# Patient Record
Sex: Female | Born: 1945 | Race: White | Hispanic: No | Marital: Married | Smoking: Never smoker
Health system: Southern US, Community
[De-identification: ages and names within clinical notes are randomized; demographics above are authoritative.]

## PROBLEM LIST (undated history)

## (undated) DIAGNOSIS — I639 Cerebral infarction, unspecified: Secondary | ICD-10-CM

## (undated) DIAGNOSIS — K625 Hemorrhage of anus and rectum: Secondary | ICD-10-CM

## (undated) DIAGNOSIS — F419 Anxiety disorder, unspecified: Secondary | ICD-10-CM

## (undated) DIAGNOSIS — F329 Major depressive disorder, single episode, unspecified: Secondary | ICD-10-CM

## (undated) DIAGNOSIS — E119 Type 2 diabetes mellitus without complications: Secondary | ICD-10-CM

## (undated) DIAGNOSIS — O2230 Deep phlebothrombosis in pregnancy, unspecified trimester: Secondary | ICD-10-CM

## (undated) DIAGNOSIS — F32A Depression, unspecified: Secondary | ICD-10-CM

## (undated) DIAGNOSIS — M1990 Unspecified osteoarthritis, unspecified site: Secondary | ICD-10-CM

## (undated) DIAGNOSIS — I10 Essential (primary) hypertension: Secondary | ICD-10-CM

## (undated) DIAGNOSIS — R45851 Suicidal ideations: Secondary | ICD-10-CM

## (undated) DIAGNOSIS — E785 Hyperlipidemia, unspecified: Secondary | ICD-10-CM

## (undated) DIAGNOSIS — G4700 Insomnia, unspecified: Secondary | ICD-10-CM

## (undated) DIAGNOSIS — C801 Malignant (primary) neoplasm, unspecified: Secondary | ICD-10-CM

## (undated) HISTORY — DX: Insomnia, unspecified: G47.00

## (undated) HISTORY — DX: Cerebral infarction, unspecified: I63.9

## (undated) HISTORY — DX: Suicidal ideations: R45.851

## (undated) HISTORY — DX: Anxiety disorder, unspecified: F41.9

## (undated) HISTORY — DX: Essential (primary) hypertension: I10

## (undated) HISTORY — DX: Type 2 diabetes mellitus without complications: E11.9

## (undated) HISTORY — DX: Unspecified osteoarthritis, unspecified site: M19.90

## (undated) HISTORY — PX: COLONOSCOPY: SHX174

## (undated) HISTORY — PX: VEIN BYPASS SURGERY: SHX833

## (undated) HISTORY — DX: Depression, unspecified: F32.A

## (undated) HISTORY — DX: Major depressive disorder, single episode, unspecified: F32.9

## (undated) HISTORY — DX: Hyperlipidemia, unspecified: E78.5

## (undated) HISTORY — DX: Hemorrhage of anus and rectum: K62.5

## (undated) HISTORY — DX: Deep phlebothrombosis in pregnancy, unspecified trimester: O22.30

---

## 2009-10-29 ENCOUNTER — Ambulatory Visit: Payer: Self-pay | Admitting: Family Medicine

## 2009-11-09 ENCOUNTER — Ambulatory Visit: Payer: Self-pay | Admitting: Family Medicine

## 2009-11-30 ENCOUNTER — Ambulatory Visit: Payer: Self-pay | Admitting: Unknown Physician Specialty

## 2009-12-14 ENCOUNTER — Ambulatory Visit: Payer: Self-pay | Admitting: Unknown Physician Specialty

## 2010-05-04 ENCOUNTER — Ambulatory Visit: Payer: Self-pay | Admitting: Unknown Physician Specialty

## 2010-05-12 ENCOUNTER — Ambulatory Visit: Payer: Self-pay | Admitting: Unknown Physician Specialty

## 2010-05-13 ENCOUNTER — Ambulatory Visit: Payer: Self-pay | Admitting: Unknown Physician Specialty

## 2010-05-25 ENCOUNTER — Ambulatory Visit: Payer: Self-pay | Admitting: Unknown Physician Specialty

## 2011-04-26 ENCOUNTER — Other Ambulatory Visit: Payer: Self-pay | Admitting: Unknown Physician Specialty

## 2011-05-13 ENCOUNTER — Ambulatory Visit: Payer: Self-pay | Admitting: Unknown Physician Specialty

## 2011-06-15 ENCOUNTER — Ambulatory Visit: Payer: Self-pay | Admitting: Anesthesiology

## 2011-06-17 ENCOUNTER — Ambulatory Visit: Payer: Self-pay | Admitting: Unknown Physician Specialty

## 2011-06-21 ENCOUNTER — Emergency Department: Payer: Self-pay

## 2012-02-29 ENCOUNTER — Ambulatory Visit: Payer: Self-pay | Admitting: Family Medicine

## 2012-10-12 ENCOUNTER — Inpatient Hospital Stay: Payer: Self-pay | Admitting: Internal Medicine

## 2012-10-12 LAB — URINALYSIS, COMPLETE
Bilirubin,UR: NEGATIVE
Blood: NEGATIVE
Glucose,UR: >=500 mg/dL (ref 0–75)
Ketone: NEGATIVE
Nitrite: NEGATIVE
Ph: 7 (ref 4.5–8.0)
Protein: NEGATIVE
Specific Gravity: 1.016 (ref 1.003–1.030)
Squamous Epithelial: <1
WBC UR: 10 /HPF (ref 0–5)

## 2012-10-12 LAB — COMPREHENSIVE METABOLIC PANEL
Albumin: 3.9 g/dL (ref 3.4–5.0)
Alkaline Phosphatase: 134 U/L (ref 50–136)
Anion Gap: 8 (ref 7–16)
Bilirubin,Total: 0.5 mg/dL (ref 0.2–1.0)
Chloride: 97 mmol/L — ABNORMAL LOW (ref 98–107)
EGFR (African American): >60
Glucose: 402 mg/dL — ABNORMAL HIGH (ref 65–99)
Potassium: 3.9 mmol/L (ref 3.5–5.1)
SGOT(AST): 24 U/L (ref 15–37)
Sodium: 131 mmol/L — ABNORMAL LOW (ref 136–145)
Total Protein: 7.7 g/dL (ref 6.4–8.2)

## 2012-10-12 LAB — CBC
HCT: 41.4 % (ref 35.0–47.0)
MCHC: 35.1 g/dL (ref 32.0–36.0)
MCV: 89 fL (ref 80–100)
RDW: 12.7 % (ref 11.5–14.5)

## 2012-10-13 DIAGNOSIS — I6789 Other cerebrovascular disease: Secondary | ICD-10-CM

## 2012-10-13 LAB — BASIC METABOLIC PANEL
Anion Gap: 6 — ABNORMAL LOW (ref 7–16)
BUN: 16 mg/dL (ref 7–18)
Calcium, Total: 8.8 mg/dL (ref 8.5–10.1)
Chloride: 103 mmol/L (ref 98–107)
Co2: 28 mmol/L (ref 21–32)
EGFR (African American): >60
EGFR (Non-African Amer.): 58 — ABNORMAL LOW
Osmolality: 281 (ref 275–301)
Potassium: 4.2 mmol/L (ref 3.5–5.1)
Sodium: 137 mmol/L (ref 136–145)

## 2012-10-13 LAB — LIPID PANEL
Cholesterol: 197 mg/dL (ref 0–200)
HDL Cholesterol: 29 mg/dL — ABNORMAL LOW (ref 40–60)
Ldl Cholesterol, Calc: 121 mg/dL — ABNORMAL HIGH (ref 0–100)
Triglycerides: 237 mg/dL — ABNORMAL HIGH (ref 0–200)
VLDL Cholesterol, Calc: 47 mg/dL — ABNORMAL HIGH (ref 5–40)

## 2013-04-15 ENCOUNTER — Emergency Department: Payer: Self-pay | Admitting: Emergency Medicine

## 2013-06-11 ENCOUNTER — Ambulatory Visit: Payer: Self-pay | Admitting: Family Medicine

## 2014-02-03 ENCOUNTER — Ambulatory Visit: Payer: Self-pay | Admitting: Internal Medicine

## 2014-09-08 ENCOUNTER — Ambulatory Visit: Payer: Self-pay | Admitting: Internal Medicine

## 2014-10-02 ENCOUNTER — Emergency Department: Payer: Self-pay | Admitting: Emergency Medicine

## 2015-02-10 NOTE — H&P (Signed)
PATIENT NAME:  Yvette Tyler, CHAWLA MR#:  [id number] DATE OF BIRTH:  [birth year]  DATE OF ADMISSION:  10/12/2012  PRIMARY CARE PHYSICIAN: [facility].   CHIEF COMPLAINT: Confusion, word finding difficulty.   HISTORY OF PRESENTING ILLNESS: A 69 year old Caucasian female patient with possible hypertension and diabetes mellitus with anxiety, insomnia, restless leg syndrome, presents to the Emergency Room brought in by her son and husband with complaints of confusion and word finding difficulty, aphasia since yesterday night. The patient yesterday night woke up from sleep and was talking strange as per the husband. She did walk into the kitchen on her own, was trying to find things but was not sure what she was looking for. Her husband made her sit at the side of the bed and then she fell, hitting her head onto a table beside the bed, after which the patient did sleep. Today morning she felt a better, still continued to have problems with speech and was brought to the Emergency Room. Here, the patient has been found to have some word finding difficulty. CT scan of the head has been negative. The patient is on significant doses of benzodiazepines, including alprazolam 4 times a day of 0.5 and temazepam 45 mg once a day at night, but she mentions that she has been on the same doses for many years. Her husband and son at bedside confirm this. She has not taken any medications extra. She is prescribed these medications by Dr. Bary Leriche of psychiatry.   She also had a headache since early today morning, which seems to be better at this time after pain medications in the ER. No nausea, vomiting, dysuria, polyuria. The patient has had some elevated blood pressure during her previous doctor visits over the past few months up to 160s. When her husband measured her blood pressure at home, it was 140/90. Today in the Emergency Room, it is 186/110. Also, her blood sugars are 404 on the blood check.   PAST MEDICAL  HISTORY: Depression, anxiety, insomnia, restless leg syndrome, possibly undiagnosed hypertension, diabetes.   SOCIAL HISTORY: The patient lives at home with her husband. Ambulates on her own. Independent with her activities of daily living. Does not smoke. No alcohol. No illicit drugs.   CODE STATUS: Full code.   FAMILY HISTORY: Reviewed. No history of any CVA or coronary artery disease in the family. No diabetes or hypertension.   ALLERGIES: DEMEROL, MORPHINE, PERCOCET.   HOME MEDICATIONS: Include: 1.  Temazepam 15 mg 3 tablets oral once a day at bedtime.  2.  Seroquel XR 150 mg oral once a day.  3.  Paroxetine 40 mg oral once a day.  4.  Imipramine 50 mg 2 tablets orally once a day at bedtime.  5.  Etodolac 500 mg oral 2 times a day as needed for legs.  6.  Alprazolam 0.5 mg oral every 6 hours as needed for anxiety.   REVIEW OF SYSTEMS:  CONSTITUTIONAL: No fever, fatigue, weakness, weight loss, weight gain.  EYES: No blurred vision, pain, redness.  ENT: No tinnitus, ear pain, hearing loss. The patient does have chronic sinusitis and sees ENT for the same.  RESPIRATORY: No cough, wheeze, hemoptysis, dyspnea.  CARDIOVASCULAR: No chest pain, orthopnea, edema.  GASTROINTESTINAL: No nausea, vomiting, diarrhea, abdominal pain or dysphagia.  GENITOURINARY: No dysuria, polyuria, frequency.  ENDOCRINE: No polyuria, nocturia, thyroid problems.  HEMATOLOGIC, LYMPHATIC: No anemia, easy bruising, bleeding.  INTEGUMENTARY: No acne, rash, lesions.  MUSCULOSKELETAL: No joint swelling or redness. No back  pain. Does have pain in her legs and cramps at night.  NEUROLOGIC: Has word finding difficulty, aphasia. No focal weakness, numbness or seizures.  PSYCHIATRIC: Has anxiety, insomnia and depression.   PHYSICAL EXAMINATION:  VITAL SIGNS: Temperature 97.5, pulse 110 (which has decreased to 90), blood pressure 186/110, saturating 95% on room air.  GENERAL: Obese Caucasian female patient lying in bed,  comfortable, conversational, cooperative with exam.  PSYCHIATRIC: Alert, oriented x 3. Is anxious. Good judgment.  HEENT: Atraumatic, normocephalic. Oral mucosa moist and pink. External ears and nose normal. No pallor. No icterus. Pupils bilaterally equal and reactive to light.  NECK: Supple. No thyromegaly. No palpable lymph nodes. Trachea midline. No carotid bruit, JVD.  CARDIOVASCULAR: S1, S2, regular rate and rhythm without any murmurs. Peripheral pulses 2+. No edema. RESPIRATORY: Normal work of breathing. Clear to auscultation on both sides.  GASTROINTESTINAL: Soft abdomen, nontender. Bowel sounds present. No hepatosplenomegaly palpable.  SKIN: Warm and dry. No petechiae, rash, ulcers.  MUSCULOSKELETAL: No joint swelling, redness, effusion of the large joints. Normal muscle tone.  NEUROLOGICAL: Motor strength 5/5 in upper and lower extremities. Sensation to fine touch intact all over. Cranial nerves II through XII intact. Reflexes 2+ in upper and lower extremities. Does have word finding difficulties and expressive aphasia.  LYMPHATIC: No cervical lymphadenopathy.   LABORATORY STUDIES: Show glucose of 402, BUN 18, creatinine 0.77, sodium 131, potassium 3.9, chloride 97. AST, ALT, alkaline phosphatase, bilirubin normal. WBC 11.5, hemoglobin 14.5, platelets 191.   CT of the head without contrast shows no acute intracranial abnormality. Does show improved aeration in paranasal sinuses with some areas of mucosal thickening.   EKG not done in the ER.   ASSESSMENT AND PLAN:  1.  Acute aphasia, expressive. The patient did have confusion at night, which had resolved. The patient does have risk factors for stroke of undiagnosed hypertension and diabetes mellitus. She is also over 65. Will admit the patient for an MRI, echo and carotid Dopplers. Her confusion at night cannot be ruled out if it is secondary to benzodiazepines, but considering the aphasia, I suspect this was secondary to stroke and is  improving. Her blood pressure is elevated at 186/110, but will hold off on any blood pressure medications at this time. Needs to be monitored and blood pressure medications if sustains to be high after 3 days. Speech therapy will be consulted.  Deep venous thrombosis prophylaxis with Lovenox. Will start the patient on an aspirin. Check fasting lipid profile and also a statin.  2.  Diabetes mellitus de novo. Will check an HbA1c, start her on Glucophage and insulin sliding scale. I suspect the patient will need additional oral hypoglycemic medications, which can be restarted depending on how she does on her fasting blood sugars.  3.  Anxiety, insomnia. The patient is on significant doses of benzodiazepines, which will be reduced in doses and monitored.  4.  Deep venous thrombosis prophylaxis with Lovenox. 5.  Code status: Full code.   TIME SPENT: Time spent today on this case was 70 minutes, with more than 50% of time spent in coordination of care.   ____________________________ Leia Alf. Zawadi Aplin, MD srs:jm D: 10/12/2012 15:56:38 ET T: 10/12/2012 16:42:42 ET JOB#: [id number]  cc: Alveta Heimlich R. Darvin Neighbours, MD, <Dictator> [place] MD ELECTRONICALLY SIGNED 10/13/2012 20:03

## 2015-02-10 NOTE — Discharge Summary (Signed)
PATIENT NAME:  Yvette Tyler, Yvette Tyler MR#:  [id number] DATE OF BIRTH:  1946-04-05  DATE OF ADMISSION:  10/12/2012 DATE OF DISCHARGE:  10/14/2012  PRIMARY CARE PHYSICIAN: Salome Holmes, MD  DISCHARGE DIAGNOSES: 1. Aphasia, possible transient ischemic attack.  2. Hypertension.  3. Diabetes.  4. Hyperlipidemia.  5. Anxiety.   CONDITION: Stable.   CODE STATUS: FULL CODE.   DISCHARGE MEDICATIONS: 1. Paroxitine 40 mg p.o. daily. 2. Imipramine 50 mg p.o. 2 tablets at bedtime.  3. Temazepam 15 mg 3 tablets at bedtime.  4. Etodolac 500 mg p.o. twice a day p.r.n. 5. Alprazolam 0.5 mg p.o. q. 6h p.r.n. for anxiety.  6. Seroquel XR 150 mg p.o. tablets 1 tablet once a day.  7. Metformin 1000 mg p.o. twice a day. 8. Zocor 40 mg p.o. at bedtime.  9. Aspirin 81 mg p.o. daily.   DIET: Low sodium, low fat, low cholesterol, ADA diet.   ACTIVITY: As tolerated.   FOLLOW-UP CARE: Follow up with PCP within 1 to 2 weeks.   REASON FOR ADMISSION: Confusion, word finding difficulty.   HOSPITAL COURSE: The patient is a 69 year old Caucasian female with a history of hypertension, diabetes, anxiety, insomnia and restless leg syndrome who presented to the ED due to confusion and word finding difficulty and aphasia the day before this admission. For detailed history and physical examination, please refer to the admission note dictated by Dr. Darvin Neighbours.    Laboratory data on admission date did not show any abnormality. CAT scan of head without contrast showed no acute intracranial abnormality. The patient was admitted for aphasia, expressive. The patient was treated with aspirin and Zocor after admission. She got MRI of brain and carotid duplex which were normal. Echocardiogram showed ejection fraction more than 55%. The patient had no aphasia or slurred speech or dysphagia after admission. She has no focal weakness or deficits.   For diabetes, the patient's Hemoglobin A1c was 11. She was treated with sliding scale  since blood sugar is not controlled. She was treated with Levemir 15 units at bedtime and then blood sugar is better.   For anxiety and insomnia, the patient was treated with low dose benzodiazepine.   For hyperlipidemia, the patient was treated with Zocor. The patient has no symptoms. The patient was clinically stable. She was discharged yesterday.   I discussed the patient's discharge plan with the patient and the case manager.   TIME SPENT: About 34 minutes.  ____________________________ Demetrios Loll, MD qc:sb D: 10/15/2012 16:16:49 ET T: 10/16/2012 09:06:41 ET JOB#: [id number]  cc: Demetrios Loll, MD, <Dictator> Demetrios Loll MD ELECTRONICALLY SIGNED 10/17/2012 16:08

## 2015-08-18 ENCOUNTER — Other Ambulatory Visit: Payer: Self-pay | Admitting: Internal Medicine

## 2015-08-18 DIAGNOSIS — Z1239 Encounter for other screening for malignant neoplasm of breast: Secondary | ICD-10-CM

## 2015-09-10 ENCOUNTER — Ambulatory Visit: Payer: Self-pay

## 2015-09-11 ENCOUNTER — Ambulatory Visit: Payer: Medicare Other

## 2015-09-16 ENCOUNTER — Ambulatory Visit
Admission: RE | Admit: 2015-09-16 | Discharge: 2015-09-16 | Disposition: A | Discharge status: Home / Self Care | Payer: Medicare Other | Source: Ambulatory Visit | Attending: Internal Medicine | Admitting: Internal Medicine

## 2015-09-16 ENCOUNTER — Other Ambulatory Visit: Payer: Self-pay | Admitting: Internal Medicine

## 2015-09-16 DIAGNOSIS — Z1239 Encounter for other screening for malignant neoplasm of breast: Secondary | ICD-10-CM

## 2015-09-16 DIAGNOSIS — Z1231 Encounter for screening mammogram for malignant neoplasm of breast: Secondary | ICD-10-CM | POA: Insufficient documentation

## 2015-09-21 ENCOUNTER — Other Ambulatory Visit: Payer: Self-pay | Admitting: Internal Medicine

## 2015-09-21 DIAGNOSIS — R928 Other abnormal and inconclusive findings on diagnostic imaging of breast: Secondary | ICD-10-CM

## 2015-10-15 ENCOUNTER — Ambulatory Visit: Payer: Medicare Other

## 2015-10-15 ENCOUNTER — Other Ambulatory Visit: Payer: Medicare Other

## 2015-10-16 ENCOUNTER — Ambulatory Visit
Admission: RE | Admit: 2015-10-16 | Discharge: 2015-10-16 | Disposition: A | Discharge status: Home / Self Care | Payer: Medicare Other | Source: Ambulatory Visit | Attending: Internal Medicine | Admitting: Internal Medicine

## 2015-10-16 DIAGNOSIS — R928 Other abnormal and inconclusive findings on diagnostic imaging of breast: Secondary | ICD-10-CM

## 2015-10-16 DIAGNOSIS — N63 Unspecified lump in breast: Secondary | ICD-10-CM | POA: Diagnosis not present

## 2015-10-21 ENCOUNTER — Other Ambulatory Visit: Payer: Self-pay | Admitting: Internal Medicine

## 2015-10-21 DIAGNOSIS — N63 Unspecified lump in unspecified breast: Secondary | ICD-10-CM

## 2015-10-27 ENCOUNTER — Encounter: Payer: Self-pay | Admitting: Oncology

## 2015-10-27 ENCOUNTER — Ambulatory Visit
Admission: RE | Admit: 2015-10-27 | Discharge: 2015-10-27 | Disposition: A | Discharge status: Home / Self Care | Payer: Medicare Other | Source: Ambulatory Visit | Attending: Internal Medicine | Admitting: Internal Medicine

## 2015-10-27 ENCOUNTER — Other Ambulatory Visit: Payer: Self-pay | Admitting: Internal Medicine

## 2015-10-27 DIAGNOSIS — N63 Unspecified lump in unspecified breast: Secondary | ICD-10-CM

## 2015-10-27 DIAGNOSIS — C50412 Malignant neoplasm of upper-outer quadrant of left female breast: Secondary | ICD-10-CM | POA: Insufficient documentation

## 2015-10-27 HISTORY — PX: BREAST BIOPSY: SHX20

## 2015-11-02 ENCOUNTER — Inpatient Hospital Stay: Payer: Medicare Other | Attending: Oncology | Admitting: Oncology

## 2015-11-02 DIAGNOSIS — E785 Hyperlipidemia, unspecified: Secondary | ICD-10-CM | POA: Insufficient documentation

## 2015-11-02 DIAGNOSIS — G4700 Insomnia, unspecified: Secondary | ICD-10-CM | POA: Insufficient documentation

## 2015-11-02 DIAGNOSIS — F419 Anxiety disorder, unspecified: Secondary | ICD-10-CM | POA: Insufficient documentation

## 2015-11-02 DIAGNOSIS — Z915 Personal history of self-harm: Secondary | ICD-10-CM | POA: Insufficient documentation

## 2015-11-02 DIAGNOSIS — Z86718 Personal history of other venous thrombosis and embolism: Secondary | ICD-10-CM | POA: Insufficient documentation

## 2015-11-02 DIAGNOSIS — Z8673 Personal history of transient ischemic attack (TIA), and cerebral infarction without residual deficits: Secondary | ICD-10-CM | POA: Insufficient documentation

## 2015-11-02 DIAGNOSIS — C50412 Malignant neoplasm of upper-outer quadrant of left female breast: Secondary | ICD-10-CM | POA: Insufficient documentation

## 2015-11-02 DIAGNOSIS — Z8719 Personal history of other diseases of the digestive system: Secondary | ICD-10-CM | POA: Insufficient documentation

## 2015-11-02 DIAGNOSIS — Z170 Estrogen receptor positive status [ER+]: Secondary | ICD-10-CM | POA: Insufficient documentation

## 2015-11-02 DIAGNOSIS — Z800 Family history of malignant neoplasm of digestive organs: Secondary | ICD-10-CM | POA: Insufficient documentation

## 2015-11-02 DIAGNOSIS — E119 Type 2 diabetes mellitus without complications: Secondary | ICD-10-CM | POA: Insufficient documentation

## 2015-11-02 DIAGNOSIS — F329 Major depressive disorder, single episode, unspecified: Secondary | ICD-10-CM | POA: Insufficient documentation

## 2015-11-02 DIAGNOSIS — M1990 Unspecified osteoarthritis, unspecified site: Secondary | ICD-10-CM | POA: Insufficient documentation

## 2015-11-02 DIAGNOSIS — I10 Essential (primary) hypertension: Secondary | ICD-10-CM | POA: Insufficient documentation

## 2015-11-09 ENCOUNTER — Telehealth: Payer: Self-pay | Admitting: *Deleted

## 2015-11-09 NOTE — Telephone Encounter (Signed)
Called and rescheduled patient to be seen tomorrow with Dr. Grayland Ormond at 3:45 for new consult of invasive left breast cancer.

## 2015-11-10 ENCOUNTER — Inpatient Hospital Stay (HOSPITAL_BASED_OUTPATIENT_CLINIC_OR_DEPARTMENT_OTHER): Payer: Medicare Other | Admitting: Oncology

## 2015-11-10 ENCOUNTER — Encounter: Payer: Self-pay | Admitting: Oncology

## 2015-11-10 VITALS — BP 153/89 | HR 94 | Temp 97.5°F | Resp 18 | Ht 64.57 in | Wt 211.6 lb

## 2015-11-10 DIAGNOSIS — Z915 Personal history of self-harm: Secondary | ICD-10-CM | POA: Diagnosis not present

## 2015-11-10 DIAGNOSIS — F419 Anxiety disorder, unspecified: Secondary | ICD-10-CM | POA: Diagnosis not present

## 2015-11-10 DIAGNOSIS — C50412 Malignant neoplasm of upper-outer quadrant of left female breast: Secondary | ICD-10-CM | POA: Diagnosis present

## 2015-11-10 DIAGNOSIS — I10 Essential (primary) hypertension: Secondary | ICD-10-CM | POA: Diagnosis not present

## 2015-11-10 DIAGNOSIS — Z8719 Personal history of other diseases of the digestive system: Secondary | ICD-10-CM | POA: Diagnosis not present

## 2015-11-10 DIAGNOSIS — Z8673 Personal history of transient ischemic attack (TIA), and cerebral infarction without residual deficits: Secondary | ICD-10-CM

## 2015-11-10 DIAGNOSIS — M1990 Unspecified osteoarthritis, unspecified site: Secondary | ICD-10-CM

## 2015-11-10 DIAGNOSIS — F329 Major depressive disorder, single episode, unspecified: Secondary | ICD-10-CM | POA: Diagnosis not present

## 2015-11-10 DIAGNOSIS — E119 Type 2 diabetes mellitus without complications: Secondary | ICD-10-CM | POA: Diagnosis not present

## 2015-11-10 DIAGNOSIS — Z800 Family history of malignant neoplasm of digestive organs: Secondary | ICD-10-CM

## 2015-11-10 DIAGNOSIS — Z86718 Personal history of other venous thrombosis and embolism: Secondary | ICD-10-CM

## 2015-11-10 DIAGNOSIS — C50912 Malignant neoplasm of unspecified site of left female breast: Secondary | ICD-10-CM

## 2015-11-10 DIAGNOSIS — G4700 Insomnia, unspecified: Secondary | ICD-10-CM

## 2015-11-10 DIAGNOSIS — Z170 Estrogen receptor positive status [ER+]: Secondary | ICD-10-CM

## 2015-11-10 DIAGNOSIS — E785 Hyperlipidemia, unspecified: Secondary | ICD-10-CM

## 2015-11-10 LAB — SURGICAL PATHOLOGY

## 2015-11-13 DIAGNOSIS — C50212 Malignant neoplasm of upper-inner quadrant of left female breast: Secondary | ICD-10-CM | POA: Insufficient documentation

## 2015-11-13 NOTE — Progress Notes (Signed)
[place]  [phone] [phone]  ID: AYA GEISEL OB: 1946-06-21  MR#: [id number]  MLY#:[id number]  No care team member to display  CHIEF COMPLAINT:  Chief Complaint  Patient presents with  . Breast Cancer    INTERVAL HISTORY: Patient is a 70 year old female who was found to have an abnormality on routine screening mammogram. Subsequent ultrasound and biopsy confirmed an invasive mammary carcinoma. Currently, she is highly anxious but otherwise feels well. She has no neurologic complaints. She denies any recent fevers or illnesses. She has a good appetite and denies weight loss. She denies any nausea, vomiting, constipation, or diarrhea. She has no chest pain or shortness of breath. She has no urinary complaints. Patient otherwise feels well and offers no further specific complaints today.  REVIEW OF SYSTEMS:   Review of Systems  Constitutional: Negative for fever, weight loss and malaise/fatigue.  Respiratory: Negative.   Cardiovascular: Negative.   Gastrointestinal: Negative.   Musculoskeletal: Negative.   Neurological: Negative.  Negative for weakness.  Psychiatric/Behavioral: The patient is nervous/anxious.     As per HPI. Otherwise, a complete review of systems is negatve.  PAST MEDICAL HISTORY: Past Medical History  Diagnosis Date  . Anxiety   . Depression   . Diabetes mellitus without complication ([place])   . DVT (deep vein thrombosis) in pregnancy   . Rectal bleeding   . Stroke ([place])   . Suicidal ideation   . Hyperlipidemia   . Hypertension   . Insomnia   . Osteoarthritis     PAST SURGICAL HISTORY: Past Surgical History  Procedure Laterality Date  . Breast biopsy Left 10/27/2015    path pending  . Vein bypass surgery    . Colonoscopy      FAMILY HISTORY Family History  Problem Relation Age of Onset  . Pancreatic cancer Father 78  . Colon cancer Paternal Uncle   . Pancreatic cancer    . Heart failure Mother         ADVANCED DIRECTIVES:    HEALTH MAINTENANCE: Social History  Substance Use Topics  . Smoking status: Never Smoker   . Smokeless tobacco: Never Used  . Alcohol Use: No     Colonoscopy:  PAP:  Bone density:  Lipid panel:  Allergies  Allergen Reactions  . Hydrocodone-Acetaminophen Other (See Comments)    Made her "feel weird"  . Meperidine Other (See Comments)  . Penicillins Rash    Penicillins group    Current Outpatient Prescriptions  Medication Sig Dispense Refill  . etodolac (LODINE) 500 MG tablet Take 1 tablet by mouth 2 (two) times daily.    . fluticasone (FLONASE) 50 MCG/ACT nasal spray Place 2 sprays into the nose at bedtime.    Marland Kitchen lisinopril (PRINIVIL,ZESTRIL) 20 MG tablet Take 1 tablet by mouth daily.    . metFORMIN (GLUCOPHAGE) 1000 MG tablet Take 500 mg by mouth 2 (two) times daily.    Marland Kitchen ALPRAZolam (XANAX) 0.5 MG tablet Take 1 tablet by mouth 4 (four) times daily as needed.    Marland Kitchen imipramine (TOFRANIL) 50 MG tablet Take 2 tablets by mouth at bedtime.    Marland Kitchen levofloxacin (LEVAQUIN) 500 MG tablet Take 1 tablet by mouth daily.    Marland Kitchen PARoxetine (PAXIL) 40 MG tablet Take 1 tablet by mouth daily.    . QUEtiapine (SEROQUEL) 300 MG tablet Take 1 tablet by mouth at bedtime.    . simvastatin (ZOCOR) 40 MG tablet Take 1 tablet by mouth at bedtime.    Marland Kitchen  temazepam (RESTORIL) 15 MG capsule Take 3 capsules by mouth at bedtime.     No current facility-administered medications for this visit.    OBJECTIVE: Filed Vitals:   11/10/15 1638  BP: 153/89  Pulse: 94  Temp: 97.5 F (36.4 C)  Resp: 18     Body mass index is 35.69 kg/(m^2).    ECOG FS:0 - Asymptomatic  General: Well-developed, well-nourished, no acute distress. Eyes: Pink conjunctiva, anicteric sclera. HEENT: Normocephalic, moist mucous membranes, clear oropharnyx. Breasts: Patient requested exam be deferred today. Lungs: Clear to auscultation bilaterally. Heart: Regular rate and rhythm. No rubs, murmurs, or  gallops. Abdomen: Soft, nontender, nondistended. No organomegaly noted, normoactive bowel sounds. Musculoskeletal: No edema, cyanosis, or clubbing. Neuro: Alert, answering all questions appropriately. Cranial nerves grossly intact. Skin: No rashes or petechiae noted. Psych: Normal affect. Lymphatics: No cervical, calvicular, axillary or inguinal LAD.   LAB RESULTS:  Lab Results  Component Value Date   NA 137 10/13/2012   K 4.2 10/13/2012   CL 103 10/13/2012   CO2 28 10/13/2012   GLUCOSE 215* 10/13/2012   BUN 16 10/13/2012   CREATININE 1.00 10/13/2012   CALCIUM 8.8 10/13/2012   PROT 7.7 10/12/2012   ALBUMIN 3.9 10/12/2012   AST 24 10/12/2012   ALT 33 10/12/2012   ALKPHOS 134 10/12/2012   BILITOT 0.5 10/12/2012   GFRNONAA 58* 10/13/2012   GFRAA >60 10/13/2012    Lab Results  Component Value Date   WBC 11.5* 10/12/2012   HGB 14.5 10/12/2012   HCT 41.4 10/12/2012   MCV 89 10/12/2012   PLT 191 10/12/2012     STUDIES: US Breast Ltd Uni Left Inc Axilla  10/16/2015  CLINICAL DATA:  Possible masses in the upper inner left breast and upper outer right breast on a recent screening mammogram. EXAM: DIGITAL DIAGNOSTIC BILATERAL MAMMOGRAM WITH 3D TOMOSYNTHESIS ULTRASOUND BILATERAL BREAST COMPARISON:  Previous exam(s). ACR Breast Density Category b: There are scattered areas of fibroglandular density. FINDINGS: Spot compression 3D tomographic images of the right breast demonstrate normal appearing fibroglandular tissue at the location of the recently suspected mass in the upper outer quadrant, anteriorly. Spot compression tomographic views of the left breast demonstrate 2 adjacent oval, irregular masses with spiculated margins in the upper inner quadrant. On physical exam, no mass is palpable in the upper outer right breast or upper inner left breast. There are no palpable lymph nodes in either axilla. Targeted ultrasound is performed, showing 2 adjacent irregular, hypoechoic mass is in  the 10 o'clock position of the left breast, 4 cm from the nipple. These are located 8 mm apart and span an area measuring 17 mm. The larger, more inferior and lateral mass, measures 7 x 6 x 6 mm and exhibits posterior acoustical shadowing. The smaller mass measures 4 x 4 x 3 mm and exhibits posterior acoustical shadowing. 8 mm inferior and lateral to the larger mass, there is a 2 x 2 x 2 mm oval, hypoechoic area with a thin peripheral anterior echogenic rim and posterior acoustical shadowing, within a fat lobule. Ultrasound of the left axilla demonstrated normal appearing lymph nodes. Ultrasound of the upper-outer right breast demonstrated normal appearing breast tissue. Ultrasound of the right axilla demonstrated normal appearing lymph nodes. IMPRESSION: 1. Adjacent 7 mm and 4 mm masses in the 10 o'clock position of the left breast, spanning an area measuring 17 mm. These have imaging features highly suspicious for malignancy. 2. 2 mm area of fat necrosis in the 10 o'clock position  of the left breast. 3. The recently suspected right breast mass was due to overlapping of normal fibroglandular tissue. 4. No adenopathy. RECOMMENDATION: Ultrasound-guided core needle biopsy of the 7 mm mass in the 10 o'clock position of the left breast. This will be scheduled in consultation with the patient and her physician. I have discussed the findings and recommendations with the patient. Results were also provided in writing at the conclusion of the visit. If applicable, a reminder letter will be sent to the patient regarding the next appointment. BI-RADS CATEGORY  5: Highly suggestive of malignancy. Electronically Signed   By: Claudie Revering M.D.   On: 10/16/2015 16:13   US Breast Ltd Uni Right Inc Axilla  10/16/2015  CLINICAL DATA:  Possible masses in the upper inner left breast and upper outer right breast on a recent screening mammogram. EXAM: DIGITAL DIAGNOSTIC BILATERAL MAMMOGRAM WITH 3D TOMOSYNTHESIS ULTRASOUND BILATERAL  BREAST COMPARISON:  Previous exam(s). ACR Breast Density Category b: There are scattered areas of fibroglandular density. FINDINGS: Spot compression 3D tomographic images of the right breast demonstrate normal appearing fibroglandular tissue at the location of the recently suspected mass in the upper outer quadrant, anteriorly. Spot compression tomographic views of the left breast demonstrate 2 adjacent oval, irregular masses with spiculated margins in the upper inner quadrant. On physical exam, no mass is palpable in the upper outer right breast or upper inner left breast. There are no palpable lymph nodes in either axilla. Targeted ultrasound is performed, showing 2 adjacent irregular, hypoechoic mass is in the 10 o'clock position of the left breast, 4 cm from the nipple. These are located 8 mm apart and span an area measuring 17 mm. The larger, more inferior and lateral mass, measures 7 x 6 x 6 mm and exhibits posterior acoustical shadowing. The smaller mass measures 4 x 4 x 3 mm and exhibits posterior acoustical shadowing. 8 mm inferior and lateral to the larger mass, there is a 2 x 2 x 2 mm oval, hypoechoic area with a thin peripheral anterior echogenic rim and posterior acoustical shadowing, within a fat lobule. Ultrasound of the left axilla demonstrated normal appearing lymph nodes. Ultrasound of the upper-outer right breast demonstrated normal appearing breast tissue. Ultrasound of the right axilla demonstrated normal appearing lymph nodes. IMPRESSION: 1. Adjacent 7 mm and 4 mm masses in the 10 o'clock position of the left breast, spanning an area measuring 17 mm. These have imaging features highly suspicious for malignancy. 2. 2 mm area of fat necrosis in the 10 o'clock position of the left breast. 3. The recently suspected right breast mass was due to overlapping of normal fibroglandular tissue. 4. No adenopathy. RECOMMENDATION: Ultrasound-guided core needle biopsy of the 7 mm mass in the 10 o'clock  position of the left breast. This will be scheduled in consultation with the patient and her physician. I have discussed the findings and recommendations with the patient. Results were also provided in writing at the conclusion of the visit. If applicable, a reminder letter will be sent to the patient regarding the next appointment. BI-RADS CATEGORY  5: Highly suggestive of malignancy. Electronically Signed   By: Claudie Revering M.D.   On: 10/16/2015 16:13   Mm Diag Breast Tomo Uni Left  10/27/2015  CLINICAL DATA:  Status post ultrasound-guided biopsy of a left breast mass performed earlier today. The left breast mass biopsied today is located at the 10 o'clock axis, 5 cm from the nipple, measuring 7 mm. Recent diagnostic imaging showed 3 separate  masses within the inner left breast. Patient expressed a desire for mastectomy if 1 mass was positive for cancer, therefore, only the largest mass was biopsied today. EXAM: DIAGNOSTIC LEFT MAMMOGRAM POST ULTRASOUND BIOPSY COMPARISON:  Previous exam(s). FINDINGS: Mammographic images were obtained following ultrasound guided biopsy of the irregular mass within the left breast at the 10 o'clock axis, 5 cm from the nipple, measuring 7 mm. At the conclusion of the procedure, a wing clip was placed at the biopsy site. This clip appears well positioned at the biopsy site which corresponds to the original suspicious mammographic finding. IMPRESSION: Postprocedure mammogram for clip placement. Biopsy clip is well positioned at the targeted mass within the upper inner quadrant of the left breast, 10 o'clock axis. Final Assessment: Post Procedure Mammograms for Marker Placement Electronically Signed   By: Franki Cabot M.D.   On: 10/27/2015 11:29   Mm Diag Breast Tomo Bilateral  10/16/2015  CLINICAL DATA:  Possible masses in the upper inner left breast and upper outer right breast on a recent screening mammogram. EXAM: DIGITAL DIAGNOSTIC BILATERAL MAMMOGRAM WITH 3D TOMOSYNTHESIS  ULTRASOUND BILATERAL BREAST COMPARISON:  Previous exam(s). ACR Breast Density Category b: There are scattered areas of fibroglandular density. FINDINGS: Spot compression 3D tomographic images of the right breast demonstrate normal appearing fibroglandular tissue at the location of the recently suspected mass in the upper outer quadrant, anteriorly. Spot compression tomographic views of the left breast demonstrate 2 adjacent oval, irregular masses with spiculated margins in the upper inner quadrant. On physical exam, no mass is palpable in the upper outer right breast or upper inner left breast. There are no palpable lymph nodes in either axilla. Targeted ultrasound is performed, showing 2 adjacent irregular, hypoechoic mass is in the 10 o'clock position of the left breast, 4 cm from the nipple. These are located 8 mm apart and span an area measuring 17 mm. The larger, more inferior and lateral mass, measures 7 x 6 x 6 mm and exhibits posterior acoustical shadowing. The smaller mass measures 4 x 4 x 3 mm and exhibits posterior acoustical shadowing. 8 mm inferior and lateral to the larger mass, there is a 2 x 2 x 2 mm oval, hypoechoic area with a thin peripheral anterior echogenic rim and posterior acoustical shadowing, within a fat lobule. Ultrasound of the left axilla demonstrated normal appearing lymph nodes. Ultrasound of the upper-outer right breast demonstrated normal appearing breast tissue. Ultrasound of the right axilla demonstrated normal appearing lymph nodes. IMPRESSION: 1. Adjacent 7 mm and 4 mm masses in the 10 o'clock position of the left breast, spanning an area measuring 17 mm. These have imaging features highly suspicious for malignancy. 2. 2 mm area of fat necrosis in the 10 o'clock position of the left breast. 3. The recently suspected right breast mass was due to overlapping of normal fibroglandular tissue. 4. No adenopathy. RECOMMENDATION: Ultrasound-guided core needle biopsy of the 7 mm mass in  the 10 o'clock position of the left breast. This will be scheduled in consultation with the patient and her physician. I have discussed the findings and recommendations with the patient. Results were also provided in writing at the conclusion of the visit. If applicable, a reminder letter will be sent to the patient regarding the next appointment. BI-RADS CATEGORY  5: Highly suggestive of malignancy. Electronically Signed   By: Claudie Revering M.D.   On: 10/16/2015 16:13   [place] Lt Breast Bx W Loc Dev 1st Lesion Img Bx Spec US Guide  10/29/2015  ADDENDUM REPORT: 10/29/2015 16:49 ADDENDUM: Pathology of the left breast biopsy revealed INVASIVE MAMMARY CARCINOMA OF NO SPECIAL TYPE. PRELIMINARY GRADE: 1 Note: Results were discussed with Nurse Amy in the office of Dr. Candiss Norse at 305 PM on 10/28/15( by pathologist). This was found to be concordant by Dr. Enriqueta Shutter. Recommendations:  Recommend surgical and oncology referral. Jetta Lout, [facility]) spoke with the patient on 10/29/15. The patient stated she has a bruise, but no tenderness at the biopsy site. Post biopsy instructions were reviewed with the patient. All of her questions were answered. The patient stated she has been given the results by Dr. Candiss Norse. She has an appointment with Dr. Grayland Ormond in oncology on Monday, November 02, 2015 at 11:30 AM. She is awaiting an appointment with Dr. Rochel Brome, surgeon. Addendum by Jetta Lout, RRA on 10/29/15. Electronically Signed   By: Franki Cabot M.D.   On: 10/29/2015 16:49  10/29/2015  CLINICAL DATA:  On recent diagnostic imaging, 3 separate masses were identified within the inner left breast, 10 o'clock axis, 4-6 cm from the nipple. Patient presents today for ultrasound-guided biopsy of the largest mass which is located at the 10 o'clock axis, 5 cm from the nipple, measuring 7 mm. Patient expresses a desire for mastectomy if 1 mass is positive for cancer, therefore, only 1 biopsy was performed today. EXAM: ULTRASOUND  GUIDED LEFT BREAST CORE NEEDLE BIOPSY COMPARISON:  Previous exam(s). PROCEDURE: I met with the patient and we discussed the procedure of ultrasound-guided biopsy, including benefits and alternatives. We discussed the high likelihood of a successful procedure. We discussed the risks of the procedure including infection, bleeding, tissue injury, clip migration, and inadequate sampling. Informed written consent was given. The usual time-out protocol was performed immediately prior to the procedure. Using sterile technique and 2% Lidocaine as local anesthetic, under direct ultrasound visualization, a 12 gauge vacuum-assisted device was used to perform biopsy of the 7 mm mass in the left breast at the 10 o'clock axis, 5 cm from the nipple,using a medial approach. At the conclusion of the procedure, a Wing tissue marker clip was deployed into the biopsy cavity. Follow-up 2-view mammogram was performed and dictated separately. IMPRESSION: Ultrasound-guided biopsy of the left breast mass at the 10 o'clock axis, 5 cm from the nipple, measuring 7 mm. No apparent complications. Electronically Signed: By: Franki Cabot M.D. 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  PLAN:    1. Breast cancer: Given the stage in size of patient's tumor on mammogram, have recommended proceeding with surgery first to discuss either lumpectomy or for mastectomy. Patient has indicated that she likely proceed with full mastectomy. If she has a full mastectomy, she will not require adjuvant XRT. Patient's biopsy has been sent for Oncotype DX testing to assess whether she is high risk and will need adjuvant chemotherapy.  Given the ER/PR positivity of patient's tumor, she will require an aromatase inhibitor for 5 years at the conclusion of her treatments. No follow-up has been scheduled at this time, but patient will follow-up after her surgery to discuss her  final pathology results and additional treatment planning.  Approximately 45 minutes was spent in discussion of which greater than 50% was consultation.  Patient expressed understanding and was in agreement with this plan. She also understands that She can call clinic at any time with any questions, concerns, or complaints.   Breast cancer [facility])   Staging form: Breast,  AJCC 7th Edition     Clinical stage from 11/13/2015: Stage IA (T1c, N0, M0) - Signed by Lloyd Huger, MD on 11/13/2015   Lloyd Huger, MD   11/13/2015 12:57 PM

## 2015-11-20 ENCOUNTER — Encounter: Payer: Self-pay | Admitting: Oncology

## 2015-11-23 ENCOUNTER — Encounter: Payer: Self-pay | Admitting: Diagnostic Radiology

## 2015-11-24 ENCOUNTER — Telehealth: Payer: Self-pay | Admitting: *Deleted

## 2015-11-24 NOTE — Telephone Encounter (Signed)
Called and scheduled patient her follow-up appointment with Dr. Grayland Ormond on 12/22/15 at 3:00.

## 2015-11-30 ENCOUNTER — Other Ambulatory Visit: Payer: Self-pay | Admitting: Surgery

## 2015-11-30 ENCOUNTER — Encounter
Admission: RE | Admit: 2015-11-30 | Discharge: 2015-11-30 | Disposition: A | Discharge status: Home / Self Care | Payer: Medicare Other | Source: Ambulatory Visit | Attending: Surgery | Admitting: Surgery

## 2015-11-30 DIAGNOSIS — Z01810 Encounter for preprocedural cardiovascular examination: Secondary | ICD-10-CM | POA: Insufficient documentation

## 2015-11-30 DIAGNOSIS — Z01812 Encounter for preprocedural laboratory examination: Secondary | ICD-10-CM | POA: Diagnosis present

## 2015-11-30 DIAGNOSIS — C50912 Malignant neoplasm of unspecified site of left female breast: Secondary | ICD-10-CM

## 2015-11-30 DIAGNOSIS — C50212 Malignant neoplasm of upper-inner quadrant of left female breast: Secondary | ICD-10-CM

## 2015-11-30 LAB — DIFFERENTIAL
BASOS ABS: 0.1 10*3/uL (ref 0–0.1)
BASOS PCT: 1 %
EOS ABS: 0.8 10*3/uL — AB (ref 0–0.7)
Eosinophils Relative: 9 %
LYMPHS PCT: 23 %
Lymphs Abs: 2 10*3/uL (ref 1.0–3.6)
Monocytes Absolute: 0.9 10*3/uL (ref 0.2–0.9)
Monocytes Relative: 10 %
NEUTROS ABS: 5.2 10*3/uL (ref 1.4–6.5)
NEUTROS PCT: 57 %

## 2015-11-30 LAB — COMPREHENSIVE METABOLIC PANEL
ALBUMIN: 3.8 g/dL (ref 3.5–5.0)
ALT: 19 U/L (ref 14–54)
AST: 20 U/L (ref 15–41)
Alkaline Phosphatase: 72 U/L (ref 38–126)
Anion gap: 7 (ref 5–15)
BUN: 25 mg/dL — AB (ref 6–20)
CHLORIDE: 104 mmol/L (ref 101–111)
CO2: 27 mmol/L (ref 22–32)
Calcium: 9.9 mg/dL (ref 8.9–10.3)
Creatinine, Ser: 1.01 mg/dL — ABNORMAL HIGH (ref 0.44–1.00)
GFR calc Af Amer: >60 mL/min (ref >60)
GFR calc non Af Amer: 55 mL/min — ABNORMAL LOW (ref >60)
GLUCOSE: 138 mg/dL — AB (ref 65–99)
POTASSIUM: 3.8 mmol/L (ref 3.5–5.1)
Sodium: 138 mmol/L (ref 135–145)
Total Bilirubin: 0.5 mg/dL (ref 0.3–1.2)
Total Protein: 7.2 g/dL (ref 6.5–8.1)

## 2015-11-30 LAB — CBC
HCT: 39.9 % (ref 35.0–47.0)
Hemoglobin: 13.6 g/dL (ref 12.0–16.0)
MCH: 30.6 pg (ref 26.0–34.0)
MCHC: 34 g/dL (ref 32.0–36.0)
MCV: 89.8 fL (ref 80.0–100.0)
PLATELETS: 221 10*3/uL (ref 150–440)
RBC: 4.45 MIL/uL (ref 3.80–5.20)
RDW: 13.8 % (ref 11.5–14.5)
WBC: 8.9 10*3/uL (ref 3.6–11.0)

## 2015-11-30 NOTE — Patient Instructions (Addendum)
  Your procedure is scheduled on: 2/14 Report to Radiology. To find out your arrival time please call [phone] between 1PM - 3PM on  Dr.Smith's office to call with arrival time.  Remember: Instructions that are not followed completely may result in serious medical risk, up to and including death, or upon the discretion of your surgeon and anesthesiologist your surgery may need to be rescheduled.    __x__ 1. Do not eat food or drink liquids after midnight. No gum chewing or hard candies.     __x__ 2. No Alcohol for 24 hours before or after surgery.   ____ 3. Bring all medications with you on the day of surgery if instructed.    ___x_ 4. Notify your doctor if there is any change in your medical condition     (cold, fever, infections).     Do not wear jewelry, make-up, hairpins, clips or nail polish.  Do not wear lotions, powders, or perfumes. You may wear deodorant.  Do not shave 48 hours prior to surgery. Men may shave face and neck.  Do not bring valuables to the hospital.    [facility] is not responsible for any belongings or valuables.               Contacts, dentures or bridgework may not be worn into surgery.  Leave your suitcase in the car. After surgery it may be brought to your room.  For patients admitted to the hospital, discharge time is determined by your                treatment team.   Patients discharged the day of surgery will not be allowed to drive home.   Please read over the following fact sheets that you were given:   Surgical Site Infection Prevention   _x___ Take these medicines the morning of surgery with A SIP OF WATER:    1. xanax  2.   3.   4.  5.  6.  ____ Fleet Enema (as directed)   __x__ Use CHG Soap as directed  ____ Use inhalers on the day of surgery  __x__ Stop metformin 2 days prior to surgery 2/11    ____ Take 1/2 of usual insulin dose the night before surgery and none on the morning of surgery.   ____ Stop  Coumadin/Plavix/aspirin   ____ Stop Anti-inflammatories on 11/30/14 No Advil or Aleve.  Stop Lodine ( etodalac)   ____ Stop supplements until after surgery.    ____ Bring C-Pap to the hospital.

## 2015-11-30 NOTE — Pre-Procedure Instructions (Signed)
Dr. Thompson Caul office notified that H&P will be out dated on day of surgery. Must be redone.

## 2015-12-02 NOTE — Pre-Procedure Instructions (Signed)
Pt called PAT asking if she is to stop Etodolac 1 week prior to surgery, Dr. Tamala Julian nurse reports Dr. Tamala Julian stops antiinflammatories 48 hours prior to surgery.  Called pt back and told her that she was correct in stopping the Etodolac 48 hours prior to surgery.

## 2015-12-08 ENCOUNTER — Inpatient Hospital Stay: Payer: Medicare Other | Admitting: Anesthesiology

## 2015-12-08 ENCOUNTER — Encounter
Admission: RE | Disposition: A | Discharge status: Home / Self Care | Payer: Self-pay | Source: Ambulatory Visit | Attending: Surgery

## 2015-12-08 ENCOUNTER — Encounter: Payer: Self-pay | Admitting: *Deleted

## 2015-12-08 ENCOUNTER — Encounter
Admission: RE | Admit: 2015-12-08 | Discharge: 2015-12-08 | Disposition: A | Discharge status: Home / Self Care | Payer: Medicare Other | Source: Ambulatory Visit | Attending: Surgery | Admitting: Surgery

## 2015-12-08 ENCOUNTER — Observation Stay
Admission: RE | Admit: 2015-12-08 | Discharge: 2015-12-09 | Disposition: A | Discharge status: Home / Self Care | Payer: Medicare Other | Source: Ambulatory Visit | Attending: Surgery | Admitting: Surgery

## 2015-12-08 DIAGNOSIS — I10 Essential (primary) hypertension: Secondary | ICD-10-CM | POA: Diagnosis not present

## 2015-12-08 DIAGNOSIS — E785 Hyperlipidemia, unspecified: Secondary | ICD-10-CM | POA: Diagnosis not present

## 2015-12-08 DIAGNOSIS — Z8673 Personal history of transient ischemic attack (TIA), and cerebral infarction without residual deficits: Secondary | ICD-10-CM | POA: Diagnosis not present

## 2015-12-08 DIAGNOSIS — E119 Type 2 diabetes mellitus without complications: Secondary | ICD-10-CM | POA: Diagnosis not present

## 2015-12-08 DIAGNOSIS — D0512 Intraductal carcinoma in situ of left breast: Secondary | ICD-10-CM | POA: Diagnosis not present

## 2015-12-08 DIAGNOSIS — C50212 Malignant neoplasm of upper-inner quadrant of left female breast: Secondary | ICD-10-CM

## 2015-12-08 DIAGNOSIS — G4700 Insomnia, unspecified: Secondary | ICD-10-CM | POA: Diagnosis not present

## 2015-12-08 DIAGNOSIS — D0502 Lobular carcinoma in situ of left breast: Secondary | ICD-10-CM | POA: Insufficient documentation

## 2015-12-08 DIAGNOSIS — Z885 Allergy status to narcotic agent status: Secondary | ICD-10-CM | POA: Insufficient documentation

## 2015-12-08 DIAGNOSIS — Z8249 Family history of ischemic heart disease and other diseases of the circulatory system: Secondary | ICD-10-CM | POA: Insufficient documentation

## 2015-12-08 DIAGNOSIS — F329 Major depressive disorder, single episode, unspecified: Secondary | ICD-10-CM | POA: Insufficient documentation

## 2015-12-08 DIAGNOSIS — Z79899 Other long term (current) drug therapy: Secondary | ICD-10-CM | POA: Insufficient documentation

## 2015-12-08 DIAGNOSIS — Z800 Family history of malignant neoplasm of digestive organs: Secondary | ICD-10-CM | POA: Insufficient documentation

## 2015-12-08 DIAGNOSIS — C50219 Malignant neoplasm of upper-inner quadrant of unspecified female breast: Secondary | ICD-10-CM | POA: Diagnosis present

## 2015-12-08 DIAGNOSIS — Z170 Estrogen receptor positive status [ER+]: Secondary | ICD-10-CM | POA: Insufficient documentation

## 2015-12-08 DIAGNOSIS — F419 Anxiety disorder, unspecified: Secondary | ICD-10-CM | POA: Insufficient documentation

## 2015-12-08 DIAGNOSIS — Z888 Allergy status to other drugs, medicaments and biological substances status: Secondary | ICD-10-CM | POA: Diagnosis not present

## 2015-12-08 DIAGNOSIS — M1990 Unspecified osteoarthritis, unspecified site: Secondary | ICD-10-CM | POA: Insufficient documentation

## 2015-12-08 HISTORY — PX: AXILLARY LYMPH NODE DISSECTION: SHX5229

## 2015-12-08 HISTORY — PX: PARTIAL MASTECTOMY WITH AXILLARY SENTINEL LYMPH NODE BIOPSY: SHX6004

## 2015-12-08 HISTORY — PX: MASTECTOMY: SHX3

## 2015-12-08 LAB — GLUCOSE, CAPILLARY
GLUCOSE-CAPILLARY: 161 mg/dL — AB (ref 65–99)
Glucose-Capillary: 153 mg/dL — ABNORMAL HIGH (ref 65–99)

## 2015-12-08 LAB — GLUCOSE, FINGERSTICK (STAT): Glucose, Fasting: 164 mg/dL — AB (ref 60–109)

## 2015-12-08 SURGERY — PARTIAL MASTECTOMY WITH AXILLARY SENTINEL LYMPH NODE BIOPSY
Anesthesia: General | Laterality: Left | Wound class: Clean

## 2015-12-08 MED ORDER — LIDOCAINE HCL (CARDIAC) 20 MG/ML IV SOLN
INTRAVENOUS | Status: DC | PRN
  Administered 2015-12-08: 40 mg via INTRAVENOUS

## 2015-12-08 MED ORDER — LABETALOL HCL 5 MG/ML IV SOLN
INTRAVENOUS | Status: AC
  Administered 2015-12-08: 5 mg via INTRAVENOUS
  Filled 2015-12-08: qty 4

## 2015-12-08 MED ORDER — FENTANYL CITRATE (PF) 100 MCG/2ML IJ SOLN
INTRAMUSCULAR | Status: AC
  Administered 2015-12-08: 50 ug via INTRAVENOUS
  Filled 2015-12-08: qty 2

## 2015-12-08 MED ORDER — EPHEDRINE SULFATE 50 MG/ML IJ SOLN: INTRAMUSCULAR | Status: DC | PRN

## 2015-12-08 MED ORDER — PROPOFOL 10 MG/ML IV BOLUS
INTRAVENOUS | Status: DC | PRN
  Administered 2015-12-08: 150 mg via INTRAVENOUS
  Administered 2015-12-08: 50 mg via INTRAVENOUS

## 2015-12-08 MED ORDER — QUETIAPINE FUMARATE 300 MG PO TABS
300.0000 mg | ORAL_TABLET | Freq: Every day | ORAL | Status: DC
  Administered 2015-12-08: 300 mg via ORAL
  Filled 2015-12-08: qty 1

## 2015-12-08 MED ORDER — SIMVASTATIN 40 MG PO TABS
40.0000 mg | ORAL_TABLET | Freq: Every day | ORAL | Status: DC
  Administered 2015-12-08: 40 mg via ORAL
  Filled 2015-12-08: qty 1

## 2015-12-08 MED ORDER — PAROXETINE HCL 20 MG PO TABS
40.0000 mg | ORAL_TABLET | Freq: Every day | ORAL | Status: DC
  Filled 2015-12-08: qty 2

## 2015-12-08 MED ORDER — FENTANYL CITRATE (PF) 100 MCG/2ML IJ SOLN
INTRAMUSCULAR | Status: DC | PRN
  Administered 2015-12-08 (×2): 50 ug via INTRAVENOUS
  Administered 2015-12-08 (×3): 25 ug via INTRAVENOUS
  Administered 2015-12-08: 50 ug via INTRAVENOUS
  Administered 2015-12-08: 25 ug via INTRAVENOUS

## 2015-12-08 MED ORDER — ONDANSETRON HCL 4 MG/2ML IJ SOLN: 4.0000 mg | Freq: Four times a day (QID) | INTRAMUSCULAR | Status: DC | PRN

## 2015-12-08 MED ORDER — ONDANSETRON 8 MG PO TBDP: 4.0000 mg | ORAL_TABLET | Freq: Four times a day (QID) | ORAL | Status: DC | PRN

## 2015-12-08 MED ORDER — EPHEDRINE SULFATE 50 MG/ML IJ SOLN
INTRAMUSCULAR | Status: DC | PRN
  Administered 2015-12-08 (×2): 10 mg via INTRAVENOUS

## 2015-12-08 MED ORDER — ACETAMINOPHEN 650 MG RE SUPP: 650.0000 mg | Freq: Four times a day (QID) | RECTAL | Status: DC | PRN

## 2015-12-08 MED ORDER — ONDANSETRON HCL 4 MG/2ML IJ SOLN: 4.0000 mg | Freq: Once | INTRAMUSCULAR | Status: DC | PRN

## 2015-12-08 MED ORDER — FAMOTIDINE 20 MG PO TABS
20.0000 mg | ORAL_TABLET | Freq: Once | ORAL | Status: AC
  Administered 2015-12-08: 20 mg via ORAL

## 2015-12-08 MED ORDER — LISINOPRIL 20 MG PO TABS
20.0000 mg | ORAL_TABLET | Freq: Every day | ORAL | Status: DC
  Administered 2015-12-09 (×2): 20 mg via ORAL
  Filled 2015-12-08: qty 1

## 2015-12-08 MED ORDER — FENTANYL CITRATE (PF) 100 MCG/2ML IJ SOLN
25.0000 ug | INTRAMUSCULAR | Status: DC | PRN
  Administered 2015-12-08 (×2): 50 ug via INTRAVENOUS

## 2015-12-08 MED ORDER — SODIUM CHLORIDE 0.9 % IV SOLN
INTRAVENOUS | Status: DC
  Administered 2015-12-08 (×3): via INTRAVENOUS

## 2015-12-08 MED ORDER — MIDAZOLAM HCL 2 MG/2ML IJ SOLN
INTRAMUSCULAR | Status: DC | PRN
  Administered 2015-12-08: 2 mg via INTRAVENOUS

## 2015-12-08 MED ORDER — METFORMIN HCL 500 MG PO TABS
500.0000 mg | ORAL_TABLET | Freq: Two times a day (BID) | ORAL | Status: DC
  Administered 2015-12-09: 500 mg via ORAL
  Filled 2015-12-08: qty 1

## 2015-12-08 MED ORDER — LABETALOL HCL 5 MG/ML IV SOLN
5.0000 mg | Freq: Once | INTRAVENOUS | Status: AC
  Administered 2015-12-08: 5 mg via INTRAVENOUS

## 2015-12-08 MED ORDER — ONDANSETRON HCL 4 MG/2ML IJ SOLN
INTRAMUSCULAR | Status: DC | PRN
  Administered 2015-12-08: 4 mg via INTRAVENOUS

## 2015-12-08 MED ORDER — IMIPRAMINE HCL 50 MG PO TABS
100.0000 mg | ORAL_TABLET | Freq: Every day | ORAL | Status: DC
  Administered 2015-12-08: 100 mg via ORAL
  Filled 2015-12-08 (×2): qty 2

## 2015-12-08 MED ORDER — TECHNETIUM TC 99M SULFUR COLLOID
0.9990 | Freq: Once | INTRAVENOUS | Status: AC | PRN
  Administered 2015-12-08: 0.999 via INTRAVENOUS

## 2015-12-08 MED ORDER — TEMAZEPAM 15 MG PO CAPS
45.0000 mg | ORAL_CAPSULE | Freq: Every day | ORAL | Status: DC
  Administered 2015-12-08: 45 mg via ORAL
  Filled 2015-12-08: qty 3

## 2015-12-08 MED ORDER — FLUTICASONE PROPIONATE 50 MCG/ACT NA SUSP
2.0000 | Freq: Every day | NASAL | Status: DC
  Administered 2015-12-08: 2 via NASAL
  Filled 2015-12-08: qty 16

## 2015-12-08 MED ORDER — ALPRAZOLAM 0.5 MG PO TABS: 0.5000 mg | ORAL_TABLET | Freq: Four times a day (QID) | ORAL | Status: DC | PRN

## 2015-12-08 MED ORDER — TRAMADOL HCL 50 MG PO TABS
50.0000 mg | ORAL_TABLET | ORAL | Status: DC | PRN
  Administered 2015-12-08 – 2015-12-09 (×3): 50 mg via ORAL
  Filled 2015-12-08 (×3): qty 1

## 2015-12-08 MED ORDER — ACETAMINOPHEN 325 MG PO TABS: 650.0000 mg | ORAL_TABLET | ORAL | Status: DC | PRN

## 2015-12-08 MED ORDER — FAMOTIDINE 20 MG PO TABS
ORAL_TABLET | ORAL | Status: AC
  Filled 2015-12-08: qty 1

## 2015-12-08 SURGICAL SUPPLY — 35 items
BLADE SURG 15 STRL LF DISP TIS (BLADE) ×2 IMPLANT
BLADE SURG 15 STRL SS (BLADE) ×4
BULB RESERV EVAC DRAIN JP 100C (MISCELLANEOUS) ×6 IMPLANT
CANISTER SUCT 1200ML W/VALVE (MISCELLANEOUS) ×3 IMPLANT
CHLORAPREP W/TINT 26ML (MISCELLANEOUS) ×3 IMPLANT
DRAIN CHANNEL JP 19F (MISCELLANEOUS) ×6 IMPLANT
DRAPE LAPAROTOMY 100X77 ABD (DRAPES) ×3 IMPLANT
DRAPE LAPAROTOMY TRNSV 106X77 (MISCELLANEOUS) ×3 IMPLANT
ELECT REM PT RETURN 9FT ADLT (ELECTROSURGICAL) ×3
ELECTRODE REM PT RTRN 9FT ADLT (ELECTROSURGICAL) ×1 IMPLANT
GAUZE SPONGE 4X4 12PLY STRL (GAUZE/BANDAGES/DRESSINGS) ×3 IMPLANT
GLOVE BIO SURGEON STRL SZ7.5 (GLOVE) ×21 IMPLANT
GOWN STRL REUS W/ TWL LRG LVL3 (GOWN DISPOSABLE) ×3 IMPLANT
GOWN STRL REUS W/TWL LRG LVL3 (GOWN DISPOSABLE) ×6
HARMONIC SCALPEL FOCUS (MISCELLANEOUS) IMPLANT
KIT RM TURNOVER STRD PROC AR (KITS) ×3 IMPLANT
LABEL OR SOLS (LABEL) ×3 IMPLANT
LIQUID BAND (GAUZE/BANDAGES/DRESSINGS) ×3 IMPLANT
PACK BASIN MINOR ARMC (MISCELLANEOUS) ×3 IMPLANT
SPONGE LAP 18X18 5 PK (GAUZE/BANDAGES/DRESSINGS) ×3 IMPLANT
SUT CHROMIC 4 0 RB 1X27 (SUTURE) IMPLANT
SUT ETH BLK MONO 3 0 FS 1 12/B (SUTURE) IMPLANT
SUT ETHILON 3-0 FS-10 30 BLK (SUTURE) ×6
SUT ETHILON 4-0 (SUTURE) ×2
SUT ETHILON 4-0 FS2 18XMFL BLK (SUTURE) ×1
SUT MNCRL 4-0 (SUTURE) ×2
SUT MNCRL 4-0 27XMFL (SUTURE) ×1
SUT SILK 3-0 (SUTURE) ×4
SUT SILK 3-0 SH-1 18XCR BRD (SUTURE) ×2
SUT VICRYL+ 3-0 144IN (SUTURE) IMPLANT
SUTURE EHLN 3-0 FS-10 30 BLK (SUTURE) ×2 IMPLANT
SUTURE ETHLN 4-0 FS2 18XMF BLK (SUTURE) ×1 IMPLANT
SUTURE MNCRL 4-0 27XMF (SUTURE) ×1 IMPLANT
SUTURE SILK 3-0 SH-1 18XCR BRD (SUTURE) ×2 IMPLANT
WATER STERILE IRR 1000ML POUR (IV SOLUTION) ×3 IMPLANT

## 2015-12-08 NOTE — Anesthesia Postprocedure Evaluation (Signed)
Anesthesia Post Note  Patient: Yvette Tyler  Procedure(s) Performed: Procedure(s) (LRB): LEFT MASTECTOMY WITH AXILLARY SENTINEL LYMPH NODE BIOPSY (Left) AXILLARY LYMPH NODE DISSECTION (Left)  Patient location during evaluation: PACU Anesthesia Type: General Level of consciousness: awake Pain management: satisfactory to patient Vital Signs Assessment: post-procedure vital signs reviewed and stable Respiratory status: respiratory function stable Cardiovascular status: stable Anesthetic complications: no    Last Vitals:  Filed Vitals:   12/08/15 1022 12/08/15 1600  BP: 155/69 165/96  Pulse: 90 102  Temp: 35.9 C 36.4 C  Resp: 16 13    Last Pain:  Filed Vitals:   12/08/15 1601  PainSc: 8                  VAN STAVEREN,Tamatha Gadbois

## 2015-12-08 NOTE — Transfer of Care (Signed)
Immediate Anesthesia Transfer of Care Note  Patient: Yvette Tyler  Procedure(s) Performed: Procedure(s) with comments: LEFT MASTECTOMY WITH AXILLARY SENTINEL LYMPH NODE BIOPSY (Left) AXILLARY LYMPH NODE DISSECTION (Left) - POSSIBLE AXILLARY NODE DISSECTION  Patient Location: PACU  Anesthesia Type:General  Level of Consciousness: sedated  Airway & Oxygen Therapy: Patient Spontanous Breathing and Patient connected to face mask oxygen  Post-op Assessment: Report given to RN and Post -op Vital signs reviewed and stable  Post vital signs: Reviewed and stable  Last Vitals:  Filed Vitals:   12/08/15 1022 12/08/15 1600  BP: 155/69 165/96  Pulse: 90 102  Temp: 35.9 C 36.4 C  Resp: 16 13    Complications: No apparent anesthesia complications

## 2015-12-08 NOTE — Op Note (Signed)
OPERATIVE REPORT  PREOPERATIVE  DIAGNOSIS: . Left breast cancer  POSTOPERATIVE DIAGNOSIS: . Left breast cancer  PROCEDURE: . Left mastectomy with sentinel lymph node biopsy  ANESTHESIA:  General  SURGEON: Rochel Brome  MD  [place],  RN   INDICATIONS: . She had recent findings of 2 cancers in the upper inner quadrant of the left breast. Surgery was recommended for definitive treatment. She elected to have a mastectomy. She did have preoperative injection of radioactive technetium sulfur colloid.  With the patient on the operating table in the supine position she was placed under general anesthesia. The left arm was placed on a lateral arm support. The left breast chest wall and upper arm were prepared with ChloraPrep and draped in a sterile manner. A curvilinear incision was made from medial to lateral above and below the breast. 3-0 silk sutures were placed for traction along the skin edges. Skin and subcutaneous flaps were raised in the direction of the clavicle sternum inframammary fold and the latissimus dorsi muscle. Dissection was carried out with electrocautery.  Additional dissection was carried out in the axilla and used the gamma counter to demonstrate the location of radioactivity. Dissection was carried down to encounter a lymph node which is approximately 1 cm in dimension and was removed with some surrounding fatty tissue. The ex vivo count was in the range of 140-200 counts per second. The background count was less than 5 counts per second. The sentinel lymph node was submitted for frozen section.  The dissection of the skin and subcutaneous flaps was continued. Subsequently the breast was elevated off the underlying deep fascia with the use of electrocautery proceeding from medial to lateral.  The pathologist called to report that the sentinel lymph node was studied with imprint and no cancer was found. Therefore an axillary lymph node dissection was not done.  The  axillary tail was excised with the breast. The lateral end of the skin ellipse was tagged with a silk suture.  The wound was inspected and found hemostasis was intact. 2 Blake drains were placed through separate inferior stab wounds and secured to the skin with 3-0 nylon suture. The skin closure was begun with 4-0 Monocryl subcuticular suture but it appeared that the dermis was extremely thin and elected to close the wound with interrupted 4-0 nylon vertical mattress sutures. The skin between the sutures was also treated with LiquiBand which was also applied to the drain sites. After the glue dried the dressings were applied with benzoin and paper tape. The patient tolerated surgery satisfactorily and was then prepared for transfer to the recovery room  [facility].D.

## 2015-12-08 NOTE — Anesthesia Procedure Notes (Signed)
Procedure Name: LMA Insertion Date/Time: 12/08/2015 1:15 PM Performed by: Allean Found Pre-anesthesia Checklist: Patient identified, Emergency Drugs available, Suction available, Patient being monitored and Timeout performed Patient Re-evaluated:Patient Re-evaluated prior to inductionOxygen Delivery Method: Circle system utilized Preoxygenation: Pre-oxygenation with 100% oxygen Intubation Type: IV induction Number of attempts: 1 Tube secured with: Tape

## 2015-12-08 NOTE — Progress Notes (Signed)
She is now awake and in the recovery room. She reports minimal discomfort. I discussed her operation. Her drains are functioning with minimal serosanguineous drainage.  Plan is to keep him overnight for a period of observation.

## 2015-12-08 NOTE — H&P (Signed)
Yvette Tyler is an 70 y.o. female.   Chief Complaint: Cancer of left breast HPI: She had recent mammograms depicting to adjacent irregular masses which were spiculated in the upper inner quadrant of the left breast. Ultrasound demonstrated at the 10:00 position 4 cm from the nipple 2 nodules spanning 17 mm. Ultrasound-guided core needle biopsy demonstrated invasive mammary carcinoma. Did have preoperative oncology consultation. She requested a mastectomy is the definitive procedure.  Past Medical History  Diagnosis Date  . Anxiety   . Depression   . Diabetes mellitus without complication ([place])   . DVT (deep vein thrombosis) in pregnancy   . Rectal bleeding   . Stroke ([place])   . Suicidal ideation   . Hyperlipidemia   . Hypertension   . Insomnia   . Osteoarthritis     Past Surgical History  Procedure Laterality Date  . Breast biopsy Left 10/27/2015    path pending  . Vein bypass surgery    . Colonoscopy      Family History  Problem Relation Age of Onset  . Pancreatic cancer Father 31  . Colon cancer Paternal Uncle   . Pancreatic cancer    . Heart failure Mother    Social History:  reports that she has never smoked. She has never used smokeless tobacco. She reports that she does not drink alcohol or use illicit drugs.  Allergies:  Allergies  Allergen Reactions  . Hydrocodone-Acetaminophen Other (See Comments)    Increased dream activity  . Meperidine Other (See Comments)    Rapid heart rate  . Penicillins Rash    Penicillins group    Medications Prior to Admission  Medication Sig Dispense Refill  . ALPRAZolam (XANAX) 0.5 MG tablet Take 1 tablet by mouth 4 (four) times daily as needed.    . fluticasone (FLONASE) 50 MCG/ACT nasal spray Place 2 sprays into the nose at bedtime.    Marland Kitchen imipramine (TOFRANIL) 50 MG tablet Take 2 tablets by mouth at bedtime.    Marland Kitchen levofloxacin (LEVAQUIN) 500 MG tablet Take 1 tablet by mouth daily.    Marland Kitchen lisinopril (PRINIVIL,ZESTRIL) 20 MG  tablet Take 1 tablet by mouth daily.    Marland Kitchen PARoxetine (PAXIL) 40 MG tablet Take 1 tablet by mouth daily.    . QUEtiapine (SEROQUEL) 300 MG tablet Take 1 tablet by mouth at bedtime.    . simvastatin (ZOCOR) 40 MG tablet Take 1 tablet by mouth at bedtime.    . temazepam (RESTORIL) 15 MG capsule Take 3 capsules by mouth at bedtime.    Marland Kitchen etodolac (LODINE) 500 MG tablet Take 1 tablet by mouth 2 (two) times daily.    . metFORMIN (GLUCOPHAGE) 1000 MG tablet Take 500 mg by mouth 2 (two) times daily.      Results for orders placed or performed during the hospital encounter of 12/08/15 (from the past 48 hour(s))  Glucose, capillary     Status: Abnormal   Collection Time: 12/08/15 10:23 AM  Result Value Ref Range   Glucose-Capillary 153 (H) 65 - 99 mg/dL   Nm Sentinel Node Inj-no Rpt (breast)  12/08/2015  CLINICAL DATA: Breast cancer of upper-inner quadrant of left female breast (HCC) Sulfur colloid was injected intradermally by the nuclear medicine technologist for breast cancer sentinel node localization.    ROS she reports a recent upper respiratory infection and has been taking a course of Levaquin. Feels that she is improved. She reports problems with restless leg syndrome and has been off of her etodolac for  3 days. She reports no other change in condition since the day of the office visit.  Blood pressure 155/69, pulse 90, temperature 96.7 F (35.9 C), temperature source Tympanic, resp. rate 16, height 5\' 6"  (1.676 m), weight 95.709 kg (211 lb), SpO2 97 %. Physical Exam  GENERAL:  Awake alert and oriented and in no acute distress.  HEENT:  Head is normocephalic.  Pupils are equal reactive to light.  Extraocular movements are intact. Sclera is clear.  Pharynx is clear.  LUNGS:  Clear without rales rhonchi or wheezes.  HEART:  Regular rhythm S1-S2, without murmur.  BREAST: A noted the dressing of the left breast at the site of injection of radioactive technetium sulfur colloid  ABDOMEN  obese soft and nontender with moderate size reducible umbilical hernia NEUROLOGICAL she is awake alert and oriented and moving all extremities.  Assessment/Plan Carcinoma of the left breast  Plan is to do a left mastectomy with sentinel lymph node biopsy possible axillary lymph node dissection. I discussed the operation care with her. Anticipate keeping overnight for a period of observation.  Rochel Brome, MD 12/08/2015, 12:37 PM   2

## 2015-12-08 NOTE — Anesthesia Preprocedure Evaluation (Signed)
Anesthesia Evaluation  Patient identified by MRN, date of birth, ID band Patient awake    Reviewed: Allergy & Precautions, H&P , NPO status , Patient's Chart, lab work & pertinent test results, reviewed documented beta blocker date and time   History of Anesthesia Complications Negative for: history of anesthetic complications  Airway Mallampati: II  TM Distance: >3 FB Neck ROM: full    Dental no notable dental hx. (+) Missing, Poor Dentition   Pulmonary neg shortness of breath, neg sleep apnea, neg COPD, Recent URI , Resolved,    Pulmonary exam normal breath sounds clear to auscultation       Cardiovascular Exercise Tolerance: Good hypertension, (-) angina(-) CAD, (-) Past MI, (-) Cardiac Stents and (-) CABG Normal cardiovascular exam(-) dysrhythmias (-) Valvular Problems/Murmurs Rhythm:regular Rate:Normal     Neuro/Psych neg Seizures PSYCHIATRIC DISORDERS (Depression) CVA, No Residual Symptoms    GI/Hepatic Neg liver ROS, GERD  Medicated,  Endo/Other  diabetes  Renal/GU negative Renal ROS  negative genitourinary   Musculoskeletal   Abdominal   Peds  Hematology negative hematology ROS (+)   Anesthesia Other Findings Past Medical History:   Anxiety                                                      Depression                                                   Diabetes mellitus without complication (HCC)                 DVT (deep vein thrombosis) in pregnancy                      Rectal bleeding                                              Stroke (HCC)                                                 Suicidal ideation                                            Hyperlipidemia                                               Hypertension                                                 Insomnia  Osteoarthritis                                               Reproductive/Obstetrics negative OB ROS                             Anesthesia Physical Anesthesia Plan  ASA: III  Anesthesia Plan: General   Post-op Pain Management:    Induction:   Airway Management Planned:   Additional Equipment:   Intra-op Plan:   Post-operative Plan:   Informed Consent: I have reviewed the patients History and Physical, chart, labs and discussed the procedure including the risks, benefits and alternatives for the proposed anesthesia with the patient or authorized representative who has indicated his/her understanding and acceptance.   Dental Advisory Given  Plan Discussed with: Anesthesiologist, CRNA and Surgeon  Anesthesia Plan Comments:         Anesthesia Quick Evaluation

## 2015-12-09 ENCOUNTER — Encounter: Payer: Self-pay | Admitting: Surgery

## 2015-12-09 DIAGNOSIS — D0512 Intraductal carcinoma in situ of left breast: Secondary | ICD-10-CM | POA: Diagnosis not present

## 2015-12-09 LAB — GLUCOSE, CAPILLARY
GLUCOSE-CAPILLARY: 164 mg/dL — AB (ref 65–99)
GLUCOSE-CAPILLARY: 147 mg/dL — AB (ref 65–99)
Glucose-Capillary: 198 mg/dL — ABNORMAL HIGH (ref 65–99)

## 2015-12-09 NOTE — Discharge Instructions (Signed)
Keep dressing dry.  Empty drains 1 or 2 times per day and reactivate and record drainage.  Call office on Monday to report the amount of drainage and schedule a follow-up appointment.  Take Tylenol and/or etodolac as needed for pain.

## 2015-12-09 NOTE — Care Management Note (Signed)
Case Management Note  Patient Details  Name: Yvette Tyler MRN: [id number] Date of Birth: 02-20-1946  Subjective/Objective:                 Patient admitted status post Left mastectomy with sentinel lymph node biopsy.  Plan for patient to discharge today.  Patient lives at home with her husband.  Obtains her medications from [facility] on [address].  Family members have called and expressed concerns of patient's living conditions.  Patient to be discharged with home health.  Patient was offered agency choice.  No preference.  Referral made to [facility].  Corene Cornea with [facility] notified of discharge plans.  Orders to be placed for RN and SW.  RNCM signing off   Action/Plan:   Expected Discharge Date:                  Expected Discharge Plan:     In-House Referral:     Discharge planning Services     Post Acute Care Choice:    Choice offered to:     DME Arranged:    DME Agency:     HH Arranged:    [place] Agency:     Status of Service:     Medicare Important Message Given:    Date Medicare IM Given:    Medicare IM give by:    Date Additional Medicare IM Given:    Additional Medicare Important Message give by:     If discussed at [place] of Stay Meetings, dates discussed:    Additional Comments:  Beverly Sessions, RN 12/09/2015, 1:06 PM

## 2015-12-09 NOTE — Discharge Summary (Signed)
  She came in through the outpatient surgery department with a diagnosis of cancer of the upper inner quadrant of the left breast.  She was carried to the operating room where she had a simple mastectomy with axillary sentinel lymph node biopsy.  2 Blake drains were inserted.  Postoperatively she was kept overnight for a period of observation.  She reports minimal discomfort at the operative site.  She does have some pain in her legs  which is chronic related to restless legs and arthritis.  On examination her dressing was dry.  Drains appear to be functioning satisfactorily draining serosanguineous fluid.  Diagnosis carcinoma of the left breast final pathology pending  Operation left simple mastectomy with axillary sentinel lymph node biopsy  Discharge instructions were given plan is for follow-up in the office

## 2015-12-09 NOTE — Progress Notes (Signed)
12/09/2015  4:25 PM  Quenton Fetter to be D/C'd Home per MD order.  Discussed prescriptions and follow up appointments with the patient. Prescriptions given to patient, medication list explained in detail. Pt verbalized understanding.    Medication List    TAKE these medications        ALPRAZolam 0.5 MG tablet  Commonly known as:  XANAX  Take 1 tablet by mouth 4 (four) times daily as needed.     etodolac 500 MG tablet  Commonly known as:  LODINE  Take 1 tablet by mouth 2 (two) times daily.     fluticasone 50 MCG/ACT nasal spray  Commonly known as:  FLONASE  Place 2 sprays into the nose at bedtime.     imipramine 50 MG tablet  Commonly known as:  TOFRANIL  Take 2 tablets by mouth at bedtime.     levofloxacin 500 MG tablet  Commonly known as:  LEVAQUIN  Take 1 tablet by mouth daily.     lisinopril 20 MG tablet  Commonly known as:  PRINIVIL,ZESTRIL  Take 1 tablet by mouth daily.     metFORMIN 1000 MG tablet  Commonly known as:  GLUCOPHAGE  Take 500 mg by mouth 2 (two) times daily.     PARoxetine 40 MG tablet  Commonly known as:  PAXIL  Take 1 tablet by mouth daily.     SEROQUEL 300 MG tablet  Generic drug:  QUEtiapine  Take 1 tablet by mouth at bedtime.     simvastatin 40 MG tablet  Commonly known as:  ZOCOR  Take 1 tablet by mouth at bedtime.     temazepam 15 MG capsule  Commonly known as:  RESTORIL  Take 3 capsules by mouth at bedtime.        Filed Vitals:   12/09/15 0041 12/09/15 0542  BP: 136/62 144/64  Pulse: 94 100  Temp: 97.5 F (36.4 C) 99.1 F (37.3 C)  Resp: 18 21    Skin clean, dry and intact without evidence of skin break down, no evidence of skin tears noted. IV catheter discontinued intact. Site without signs and symptoms of complications. Dressing and pressure applied. Pt denies pain at this time. No complaints noted.  An After Visit Summary was printed and given to the patient. Patient escorted via [place], and D/C home via private  auto.  Dola Argyle

## 2015-12-11 LAB — SURGICAL PATHOLOGY

## 2015-12-14 ENCOUNTER — Telehealth: Payer: Self-pay | Admitting: *Deleted

## 2015-12-15 NOTE — Telephone Encounter (Signed)
Called to follow-up on patient.  No answer. Unable to leave a message.

## 2015-12-22 ENCOUNTER — Inpatient Hospital Stay: Payer: Medicare Other | Attending: Oncology | Admitting: Oncology

## 2015-12-22 ENCOUNTER — Ambulatory Visit: Payer: Medicare Other | Admitting: Oncology

## 2015-12-22 VITALS — BP 162/89 | HR 93 | Temp 98.1°F | Resp 18 | Wt 206.4 lb

## 2015-12-22 DIAGNOSIS — Z7984 Long term (current) use of oral hypoglycemic drugs: Secondary | ICD-10-CM | POA: Insufficient documentation

## 2015-12-22 DIAGNOSIS — Z8719 Personal history of other diseases of the digestive system: Secondary | ICD-10-CM | POA: Insufficient documentation

## 2015-12-22 DIAGNOSIS — M1990 Unspecified osteoarthritis, unspecified site: Secondary | ICD-10-CM | POA: Diagnosis not present

## 2015-12-22 DIAGNOSIS — Z170 Estrogen receptor positive status [ER+]: Secondary | ICD-10-CM | POA: Diagnosis not present

## 2015-12-22 DIAGNOSIS — I10 Essential (primary) hypertension: Secondary | ICD-10-CM | POA: Diagnosis not present

## 2015-12-22 DIAGNOSIS — Z79899 Other long term (current) drug therapy: Secondary | ICD-10-CM | POA: Insufficient documentation

## 2015-12-22 DIAGNOSIS — C50212 Malignant neoplasm of upper-inner quadrant of left female breast: Secondary | ICD-10-CM | POA: Diagnosis not present

## 2015-12-22 DIAGNOSIS — E119 Type 2 diabetes mellitus without complications: Secondary | ICD-10-CM | POA: Diagnosis not present

## 2015-12-22 DIAGNOSIS — Z8673 Personal history of transient ischemic attack (TIA), and cerebral infarction without residual deficits: Secondary | ICD-10-CM | POA: Diagnosis not present

## 2015-12-22 DIAGNOSIS — F418 Other specified anxiety disorders: Secondary | ICD-10-CM | POA: Diagnosis not present

## 2015-12-22 DIAGNOSIS — G4700 Insomnia, unspecified: Secondary | ICD-10-CM | POA: Diagnosis not present

## 2015-12-22 DIAGNOSIS — Z86718 Personal history of other venous thrombosis and embolism: Secondary | ICD-10-CM | POA: Insufficient documentation

## 2015-12-22 DIAGNOSIS — Z780 Asymptomatic menopausal state: Secondary | ICD-10-CM

## 2015-12-22 DIAGNOSIS — E785 Hyperlipidemia, unspecified: Secondary | ICD-10-CM | POA: Insufficient documentation

## 2015-12-22 DIAGNOSIS — Z800 Family history of malignant neoplasm of digestive organs: Secondary | ICD-10-CM | POA: Insufficient documentation

## 2015-12-22 DIAGNOSIS — C50912 Malignant neoplasm of unspecified site of left female breast: Secondary | ICD-10-CM

## 2015-12-22 DIAGNOSIS — Z79811 Long term (current) use of aromatase inhibitors: Secondary | ICD-10-CM | POA: Diagnosis not present

## 2015-12-22 MED ORDER — LETROZOLE 2.5 MG PO TABS: 2.5000 mg | ORAL_TABLET | Freq: Every day | ORAL | Status: DC

## 2016-01-02 NOTE — Progress Notes (Signed)
[place]  Telephone:([phone] [phone]) [phone]  ID: Yvette Tyler, Yvette Tyler  MR#: [id number]  HLK#:[id number]  No care team member to display  CHIEF COMPLAINT:  Chief Complaint  Patient presents with  . Breast Cancer    INTERVAL HISTORY: Patient returns to clinic today for further evaluation, discussion of her pathology results, and treatment planning. She continues to be anxious, but otherwise feels well. She has no neurologic complaints. She denies any recent fevers or illnesses. She has a good appetite and denies weight loss. She denies any nausea, vomiting, constipation, or diarrhea. She has no chest pain or shortness of breath. She has no urinary complaints. Patient otherwise feels well and offers no further specific complaints today.  REVIEW OF SYSTEMS:   Review of Systems  Constitutional: Negative for fever, weight loss and malaise/fatigue.  Respiratory: Negative.  Negative for shortness of breath.   Cardiovascular: Negative.  Negative for chest pain.  Gastrointestinal: Negative.  Negative for abdominal pain.  Musculoskeletal: Negative.   Neurological: Negative.  Negative for weakness.  Psychiatric/Behavioral: The patient is nervous/anxious.     As per HPI. Otherwise, a complete review of systems is negatve.  PAST MEDICAL HISTORY: Past Medical History  Diagnosis Date  . Anxiety   . Depression   . Diabetes mellitus without complication ([place])   . DVT (deep vein thrombosis) in pregnancy   . Rectal bleeding   . Stroke ([place])   . Suicidal ideation   . Hyperlipidemia   . Hypertension   . Insomnia   . Osteoarthritis     PAST SURGICAL HISTORY: Past Surgical History  Procedure Laterality Date  . Breast biopsy Left 10/27/2015    path pending  . Vein bypass surgery    . Colonoscopy    . Partial mastectomy with axillary sentinel lymph node biopsy Left 12/08/2015    Procedure: LEFT MASTECTOMY WITH AXILLARY SENTINEL LYMPH NODE BIOPSY;   Surgeon: Leonie Green, MD;  Location: [facility] ORS;  Service: General;  Laterality: Left;  . Axillary lymph node dissection Left 12/08/2015    Procedure: AXILLARY LYMPH NODE DISSECTION;  Surgeon: Leonie Green, MD;  Location: [facility] ORS;  Service: General;  Laterality: Left;  POSSIBLE AXILLARY NODE DISSECTION    FAMILY HISTORY Family History  Problem Relation Age of Onset  . Pancreatic cancer Father 8  . Colon cancer Paternal Uncle   . Pancreatic cancer    . Heart failure Mother        ADVANCED DIRECTIVES:    HEALTH MAINTENANCE: Social History  Substance Use Topics  . Smoking status: Never Smoker   . Smokeless tobacco: Never Used  . Alcohol Use: No     Colonoscopy:  PAP:  Bone density:  Lipid panel:  Allergies  Allergen Reactions  . Hydrocodone-Acetaminophen Other (See Comments)    Increased dream activity  . Meperidine Other (See Comments)    Rapid heart rate  . Penicillins Rash    Penicillins group    Current Outpatient Prescriptions  Medication Sig Dispense Refill  . ALPRAZolam (XANAX) 0.5 MG tablet Take 1 tablet by mouth 4 (four) times daily as needed.    . etodolac (LODINE) 500 MG tablet Take 1 tablet by mouth 2 (two) times daily.    . fluticasone (FLONASE) 50 MCG/ACT nasal spray Place 2 sprays into the nose at bedtime.    Marland Kitchen imipramine (TOFRANIL) 50 MG tablet Take 2 tablets by mouth at bedtime.    Marland Kitchen levofloxacin (LEVAQUIN) 500 MG tablet Take 1  tablet by mouth daily.    Marland Kitchen lisinopril (PRINIVIL,ZESTRIL) 20 MG tablet Take 1 tablet by mouth daily.    . metFORMIN (GLUCOPHAGE) 1000 MG tablet Take 500 mg by mouth 2 (two) times daily.    Marland Kitchen PARoxetine (PAXIL) 40 MG tablet Take 1 tablet by mouth daily.    . QUEtiapine (SEROQUEL) 300 MG tablet Take 1 tablet by mouth at bedtime.    . simvastatin (ZOCOR) 40 MG tablet Take 1 tablet by mouth at bedtime.    . temazepam (RESTORIL) 15 MG capsule Take 3 capsules by mouth at bedtime.    Marland Kitchen glimepiride (AMARYL) 2 MG tablet  TAKE 1 TABLET (2 MG TOTAL) BY MOUTH DAILY WITH BREAKFAST.  11  . letrozole (FEMARA) 2.5 MG tablet Take 1 tablet (2.5 mg total) by mouth daily. 30 tablet 3   No current facility-administered medications for this visit.    OBJECTIVE: Filed Vitals:   12/22/15 1624  BP: 162/89  Pulse: 93  Temp: 98.1 F (36.7 C)  Resp: 18     Body mass index is 33.32 kg/(m^2).    ECOG FS:0 - Asymptomatic  General: Well-developed, well-nourished, no acute distress. Eyes: Pink conjunctiva, anicteric sclera. Breasts: Patient requested exam be deferred today. Lungs: Clear to auscultation bilaterally. Heart: Regular rate and rhythm. No rubs, murmurs, or gallops. Abdomen: Soft, nontender, nondistended. No organomegaly noted, normoactive bowel sounds. Musculoskeletal: No edema, cyanosis, or clubbing. Neuro: Alert, answering all questions appropriately. Cranial nerves grossly intact. Skin: No rashes or petechiae noted. Psych: Normal affect.   LAB RESULTS:  Lab Results  Component Value Date   NA 138 11/30/2015   K 3.8 11/30/2015   CL 104 11/30/2015   CO2 27 11/30/2015   GLUCOSE 138* 11/30/2015   BUN 25* 11/30/2015   CREATININE 1.01* 11/30/2015   CALCIUM 9.9 11/30/2015   PROT 7.2 11/30/2015   ALBUMIN 3.8 11/30/2015   AST 20 11/30/2015   ALT 19 11/30/2015   ALKPHOS 72 11/30/2015   BILITOT 0.5 11/30/2015   GFRNONAA 55* 11/30/2015   GFRAA >60 11/30/2015    Lab Results  Component Value Date   WBC 8.9 11/30/2015   NEUTROABS 5.2 11/30/2015   HGB 13.6 11/30/2015   HCT 39.9 11/30/2015   MCV 89.8 11/30/2015   PLT 221 11/30/2015     STUDIES: Nm Sentinel Node Inj-no Rpt (breast)  12/08/2015  CLINICAL DATA: Breast cancer of upper-inner quadrant of left female breast (HCC) Sulfur colloid was injected intradermally by the nuclear medicine technologist for breast cancer sentinel node localization.    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 Oncotype DX score was low risk at 17.  PLAN:    1. Breast cancer: Final pathology results as above. Given patient's low risk Oncotype score of 17 with a 10 year distant recurrence of approximately 11%, patient does not require adjuvant chemotherapy. She also elected for a simple mastectomy, therefore she does not require adjuvant XRT. She was given a prescription for lunch result today which she will require to take for 5 years completing in March 2022. Return to clinic in 3 months for further evaluation. 2. Hypertension: Blood pressure mildly elevated today. Continue current medications as prescribed.  Approximately 30 minutes was spent in discussion of which greater than 50% was consultation.  Patient expressed understanding and was in agreement with this plan. She also understands that She can call clinic at any time with any questions, concerns, or complaints.   Breast cancer (  [place])   Staging form: Breast, AJCC 7th Edition     Pathologic stage from 11/13/2015: Stage IA (T1c, N0, M0) - Signed by Lloyd Huger, MD on 11/13/2015   Lloyd Huger, MD   01/02/2016 7:56 AM

## 2016-01-04 IMAGING — MG MM DIAG BREAST TOMO UNI LEFT
4 series · 5 of 8 positions shown · non-contrast
Comparison: Previous exam(s).

CLINICAL DATA: Status post ultrasound-guided biopsy of a left
breast mass performed earlier today. The left breast mass biopsied
today is located at the 10 o'clock axis, 5 cm from the nipple,
measuring 7 mm.

Recent diagnostic imaging showed 3 separate masses within the inner
left breast. Patient expressed a desire for mastectomy if 1 mass was
positive for cancer, therefore, only the largest mass was biopsied
today.
EXAM:
DIAGNOSTIC LEFT MAMMOGRAM POST ULTRASOUND BIOPSY

[L ML synth-2D]
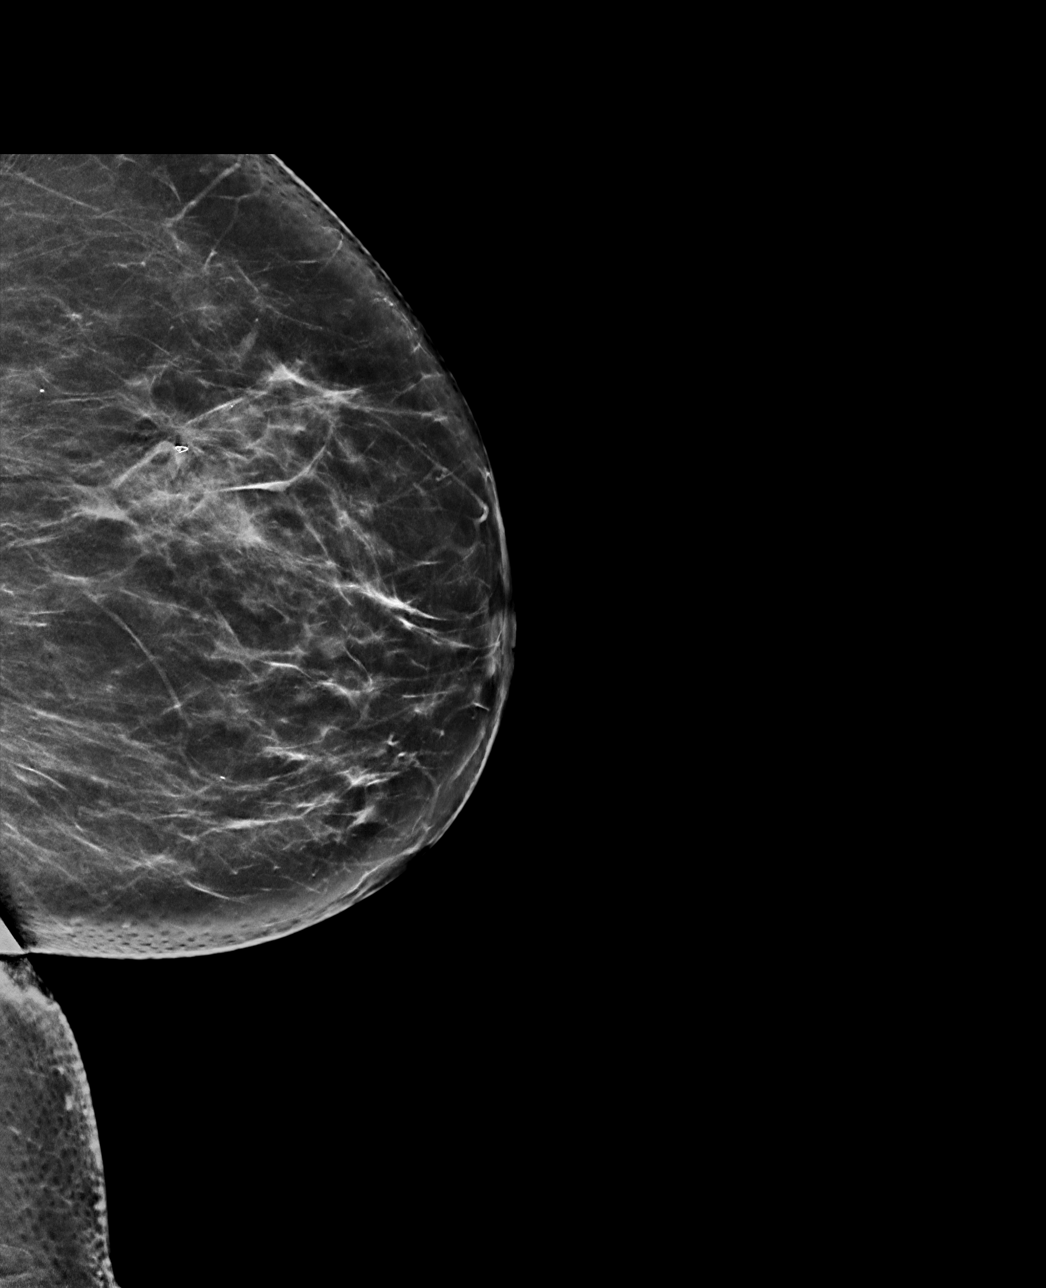

[L ML]
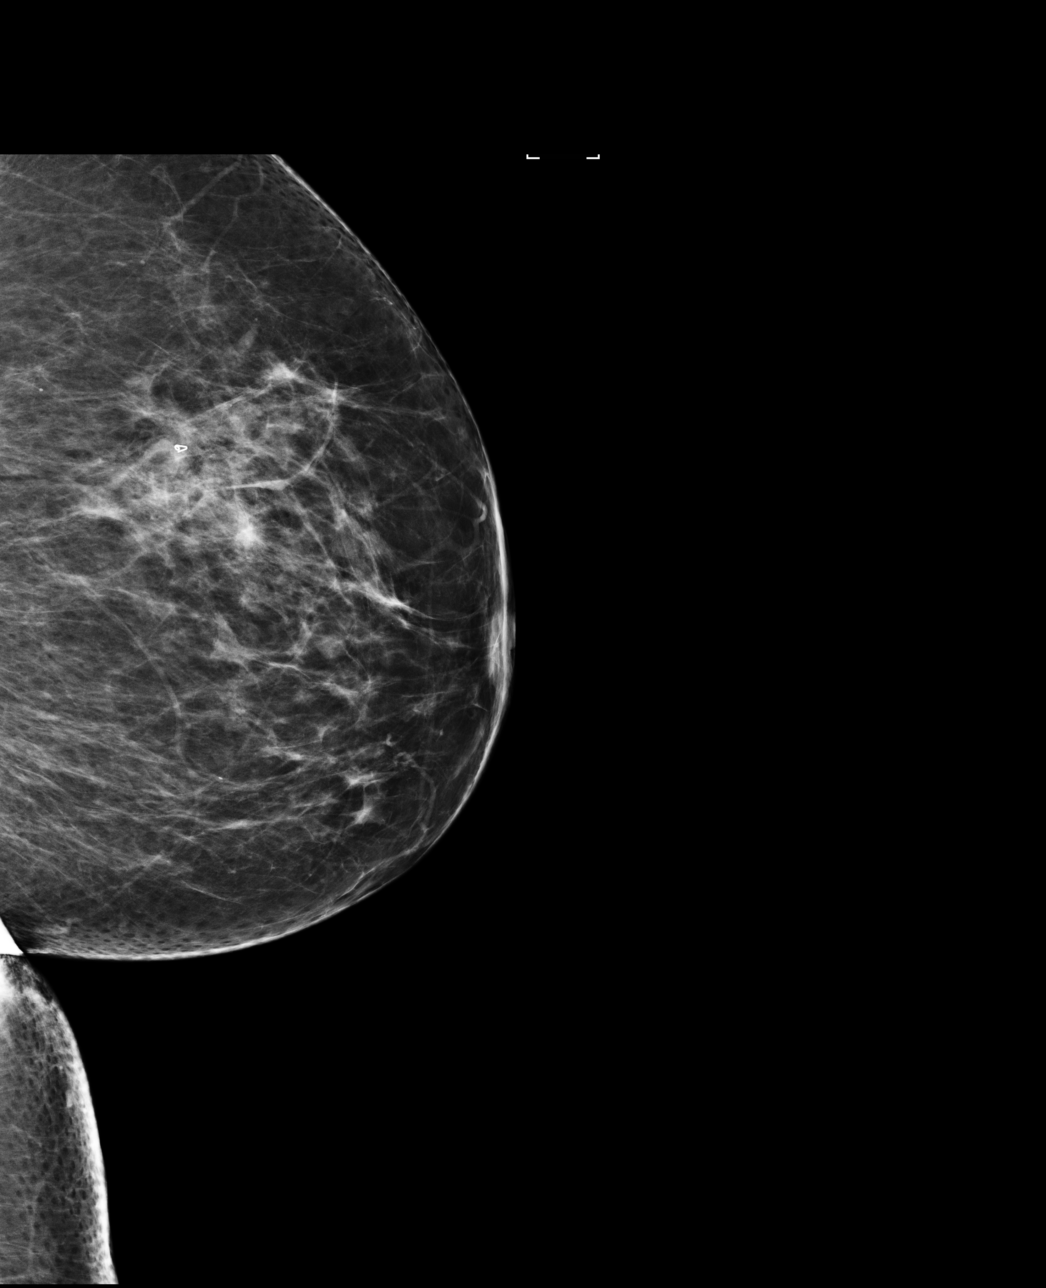

[L ML tomo (1 of 2)]
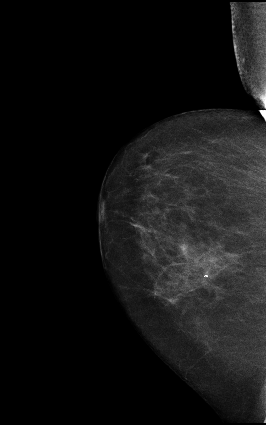

[L ML tomo · 2 of 74 frames shown (2 of 2)]
[frame 24/74]
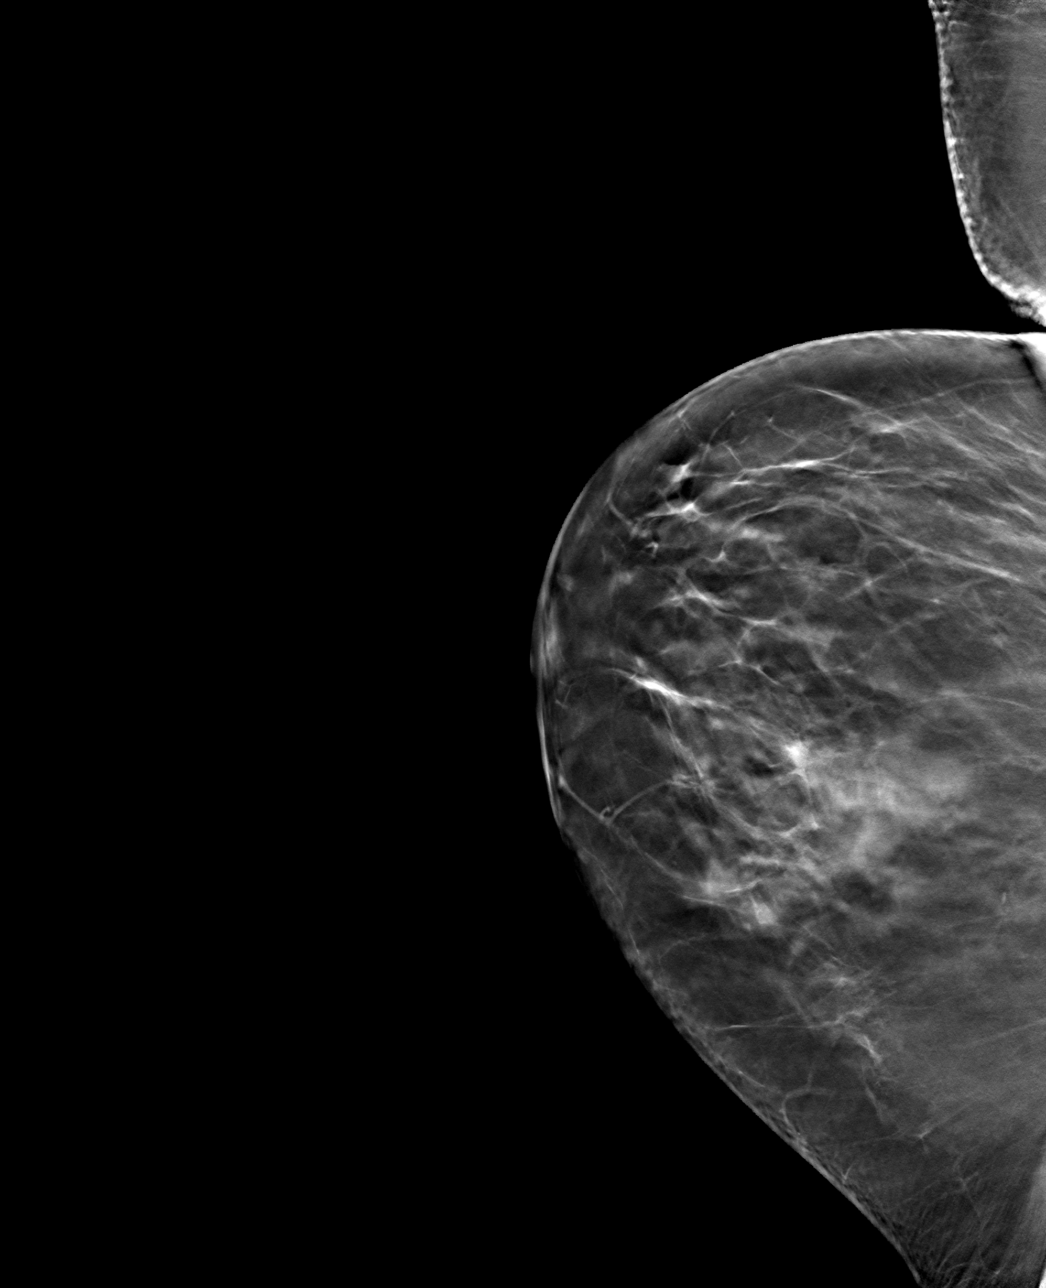
[frame 37/74]
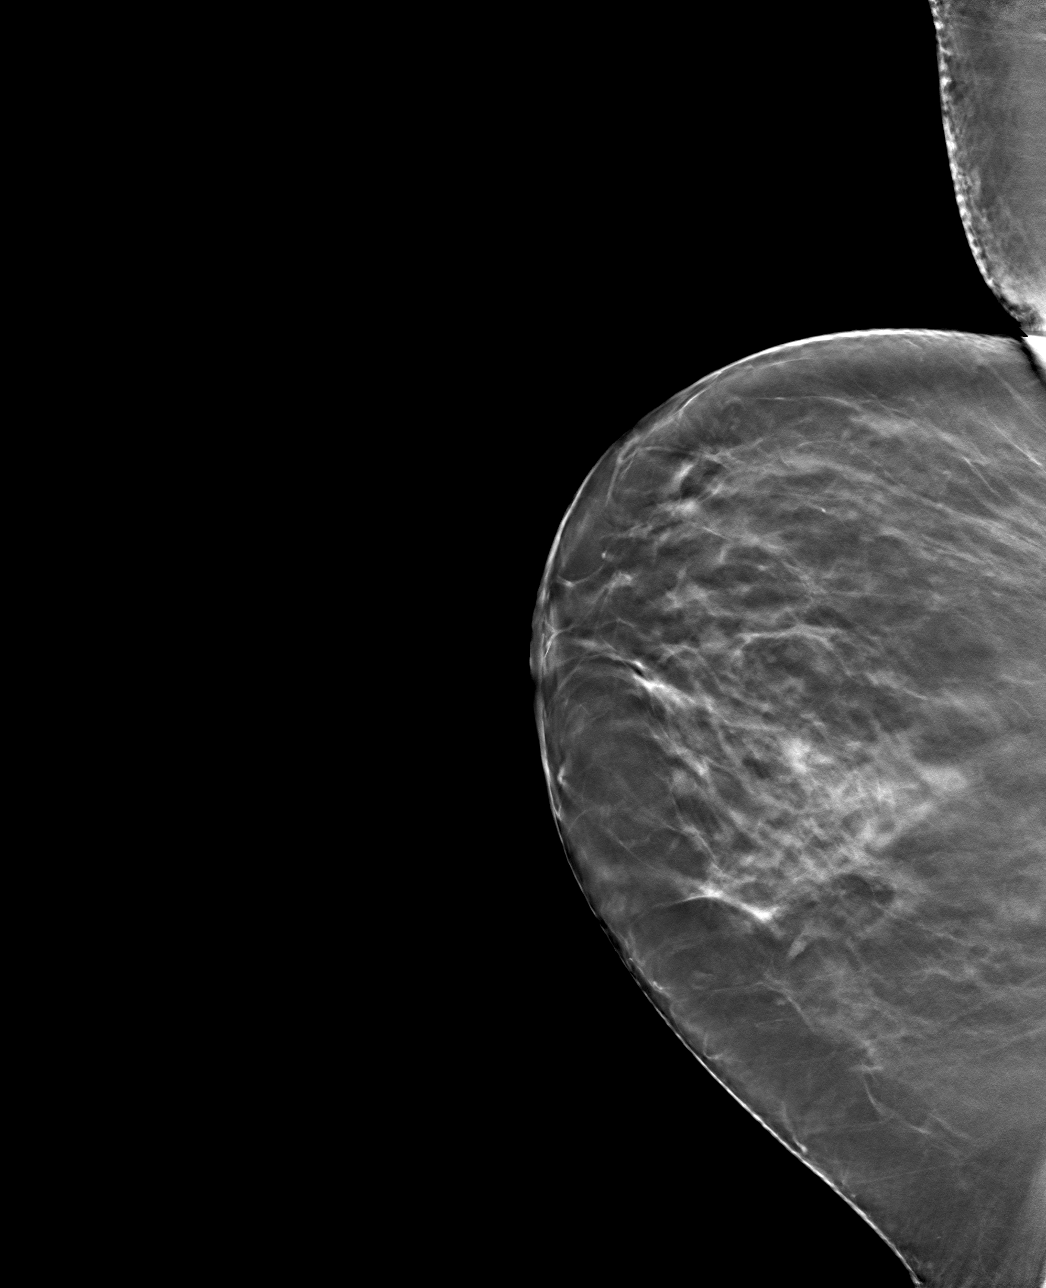

[5 of 8 positions shown; findings below may reference images not displayed]

FINDINGS: Mammographic images were obtained following ultrasound guided biopsy
of the irregular mass within the left breast at the 10 o'clock axis,
5 cm from the nipple, measuring 7 mm. At the conclusion of the
procedure, a wing clip was placed at the biopsy site. This clip
appears well positioned at the biopsy site which corresponds to the
original suspicious mammographic finding.
IMPRESSION: Postprocedure mammogram for clip placement. Biopsy clip is well
positioned at the targeted mass within the upper inner quadrant of
the left breast, 10 o'clock axis.

Final Assessment: Post Procedure Mammograms for Marker Placement

## 2016-01-06 ENCOUNTER — Ambulatory Visit: Payer: Medicare Other | Attending: Oncology

## 2016-01-20 ENCOUNTER — Encounter: Payer: Self-pay | Admitting: *Deleted

## 2016-01-20 NOTE — Progress Notes (Signed)
  Oncology Nurse Navigator Documentation  Navigator Location: [facility] (01/20/16 1600) Navigator Encounter Type: Telephone (01/20/16 1600)             Treatment Phase: Follow-up (01/20/16 1600)                            Time Spent with Patient: 15 (01/20/16 1600)   Patient states she has some swelling in the axilla from her surgery.  Requested she follow-up with Dr. Tamala Julian.  Patient states she has some green mucus, cough, sore throat, but no fever.  Wants an antibiotic.  Dr. Grayland Ormond is on vacation.  Encouraged her to call her primary care provider.  She is agreeable.

## 2016-03-21 ENCOUNTER — Ambulatory Visit: Payer: Medicare Other | Admitting: Oncology

## 2016-03-23 ENCOUNTER — Inpatient Hospital Stay: Payer: Medicare Other | Admitting: Oncology

## 2016-04-11 ENCOUNTER — Inpatient Hospital Stay: Payer: Medicare Other | Admitting: Oncology

## 2016-05-04 ENCOUNTER — Inpatient Hospital Stay: Payer: Medicare Other | Admitting: Oncology

## 2016-05-09 ENCOUNTER — Inpatient Hospital Stay: Payer: Medicare Other | Admitting: Oncology

## 2016-05-10 ENCOUNTER — Inpatient Hospital Stay: Payer: Medicare Other | Admitting: Oncology

## 2016-06-07 NOTE — Progress Notes (Signed)
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  INTERVAL HISTORY: Patient returns to clinic today for routine 3 month evaluation. She is tolerating letrozole well without significant side effects. She has no neurologic complaints. She denies any recent fevers or illnesses. She has a good appetite and denies weight loss. She denies any nausea, vomiting, constipation, or diarrhea. She has no chest pain or shortness of breath. She has no urinary complaints. Patient offers no specific complaints today.  REVIEW OF SYSTEMS:   Review of Systems  Constitutional: Negative for fever, malaise/fatigue and weight loss.  Respiratory: Negative.  Negative for shortness of breath.   Cardiovascular: Negative.  Negative for chest pain.  Gastrointestinal: Negative.  Negative for abdominal pain.  Musculoskeletal: Negative.   Neurological: Negative.  Negative for weakness.  Psychiatric/Behavioral: The patient is nervous/anxious.     As per HPI. Otherwise, a complete review of systems is negatve.  PAST MEDICAL HISTORY: Past Medical History:  Diagnosis Date  . Anxiety   . Depression   . Diabetes mellitus without complication ([place])   . DVT (deep vein thrombosis) in pregnancy   . Hyperlipidemia   . Hypertension   . Insomnia   . Osteoarthritis   . Rectal bleeding   . Stroke ([place])   . Suicidal ideation     PAST SURGICAL HISTORY: Past Surgical History:  Procedure Laterality Date  . AXILLARY LYMPH NODE DISSECTION Left 12/08/2015   Procedure: AXILLARY LYMPH NODE DISSECTION;  Surgeon: Leonie Green, MD;  Location: [facility] ORS;  Service: General;  Laterality: Left;  POSSIBLE AXILLARY NODE DISSECTION  . BREAST BIOPSY Left  10/27/2015   path pending  . COLONOSCOPY    . PARTIAL MASTECTOMY WITH AXILLARY SENTINEL LYMPH NODE BIOPSY Left 12/08/2015   Procedure: LEFT MASTECTOMY WITH AXILLARY SENTINEL LYMPH NODE BIOPSY;  Surgeon: Leonie Green, MD;  Location: [facility] ORS;  Service: General;  Laterality: Left;  Marland Kitchen VEIN BYPASS SURGERY      FAMILY HISTORY Family History  Problem Relation Age of Onset  . Pancreatic cancer Father 42  . Colon cancer Paternal Uncle   . Pancreatic cancer    . Heart failure Mother        ADVANCED DIRECTIVES:    HEALTH MAINTENANCE: Social History  Substance Use Topics  . Smoking status: Never Smoker  . Smokeless tobacco: Never Used  . Alcohol use No     Colonoscopy:  PAP:  Bone density:  Lipid panel:  Allergies  Allergen Reactions  . Hydrocodone-Acetaminophen Other (See Comments)    Increased dream activity  . Meperidine Other (See Comments)    Rapid heart rate  . Penicillins Rash    Penicillins group    Current Outpatient Prescriptions  Medication Sig Dispense Refill  . ALPRAZolam (XANAX) 0.5 MG tablet Take 1 tablet by mouth 4 (four) times daily as needed.    . etodolac (LODINE) 500 MG tablet Take 1 tablet by mouth 2 (two) times daily.    . fluticasone (FLONASE) 50 MCG/ACT nasal spray Place 2 sprays into the nose at bedtime.    Marland Kitchen glimepiride (AMARYL) 2 MG tablet TAKE 1 TABLET (2 MG TOTAL) BY MOUTH DAILY WITH BREAKFAST.  11  . imipramine (TOFRANIL) 50 MG tablet Take 2 tablets by mouth at bedtime.    Marland Kitchen  letrozole (FEMARA) 2.5 MG tablet Take 1 tablet (2.5 mg total) by mouth daily. 30 tablet 3  . lisinopril (PRINIVIL,ZESTRIL) 20 MG tablet Take 1 tablet by mouth daily.    . metFORMIN (GLUCOPHAGE) 1000 MG tablet Take 500 mg by mouth 2 (two) times daily.    Marland Kitchen PARoxetine (PAXIL) 40 MG tablet Take 1 tablet by mouth daily.    . QUEtiapine (SEROQUEL) 300 MG tablet Take 1 tablet by mouth at bedtime.    . simvastatin (ZOCOR) 40 MG tablet Take 1 tablet by mouth at bedtime.    .  temazepam (RESTORIL) 15 MG capsule Take 3 capsules by mouth at bedtime.    Marland Kitchen levofloxacin (LEVAQUIN) 500 MG tablet Take 1 tablet (500 mg total) by mouth daily. 5 tablet 0   No current facility-administered medications for this visit.     OBJECTIVE: Vitals:   06/08/16 1656  BP: 129/88  Pulse: 98  Temp: 97.2 F (36.2 C)     Body mass index is 34.51 kg/m.    ECOG FS:0 - Asymptomatic  General: Well-developed, well-nourished, no acute distress. Eyes: Pink conjunctiva, anicteric sclera. Breasts: Left chest wall and axilla without evidence of recurrence. Right breast and axilla without lumps or masses. Lungs: Clear to auscultation bilaterally. Heart: Regular rate and rhythm. No rubs, murmurs, or gallops. Abdomen: Soft, nontender, nondistended. No organomegaly noted, normoactive bowel sounds. Musculoskeletal: No edema, cyanosis, or clubbing. Neuro: Alert, answering all questions appropriately. Cranial nerves grossly intact. Skin: No rashes or petechiae noted. Psych: Normal affect.   LAB RESULTS:  Lab Results  Component Value Date   NA 138 11/30/2015   K 3.8 11/30/2015   CL 104 11/30/2015   CO2 27 11/30/2015   GLUCOSE 138 (H) 11/30/2015   BUN 25 (H) 11/30/2015   CREATININE 1.01 (H) 11/30/2015   CALCIUM 9.9 11/30/2015   PROT 7.2 11/30/2015   ALBUMIN 3.8 11/30/2015   AST 20 11/30/2015   ALT 19 11/30/2015   ALKPHOS 72 11/30/2015   BILITOT 0.5 11/30/2015   GFRNONAA 55 (L) 11/30/2015   GFRAA >60 11/30/2015    Lab Results  Component Value Date   WBC 8.9 11/30/2015   NEUTROABS 5.2 11/30/2015   HGB 13.6 11/30/2015   HCT 39.9 11/30/2015   MCV 89.8 11/30/2015   PLT 221 11/30/2015     STUDIES: No results found.  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 Oncotype DX score was low risk at 17.  PLAN:    1. 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 She also elected for a simple mastectomy, therefore she did not require adjuvant XRT. Continue letrozole for 5 years completing in March 2022. Patient will require repeat mammogram in January 2018. Return to clinic in 6 months for further evaluation.  2. Hypertension: Blood pressure mildly elevated today. Continue current medications as prescribed.  Patient expressed understanding and was in agreement with this plan. She also understands that She can call clinic at any time with any questions, concerns, or complaints.   Breast cancer [facility])   Staging form: Breast, AJCC 7th Edition     Pathologic stage from 11/13/2015: Stage IA (T1c, N0, M0) - Signed by Lloyd Huger, MD on 11/13/2015   Lloyd Huger,  MD   06/12/2016 9:15 AM

## 2016-06-08 ENCOUNTER — Inpatient Hospital Stay: Payer: Medicare Other | Attending: Oncology | Admitting: Oncology

## 2016-06-08 VITALS — BP 129/88 | HR 98 | Temp 97.2°F | Wt 213.8 lb

## 2016-06-08 DIAGNOSIS — F419 Anxiety disorder, unspecified: Secondary | ICD-10-CM

## 2016-06-08 DIAGNOSIS — E785 Hyperlipidemia, unspecified: Secondary | ICD-10-CM | POA: Insufficient documentation

## 2016-06-08 DIAGNOSIS — Z8673 Personal history of transient ischemic attack (TIA), and cerebral infarction without residual deficits: Secondary | ICD-10-CM | POA: Insufficient documentation

## 2016-06-08 DIAGNOSIS — Z9012 Acquired absence of left breast and nipple: Secondary | ICD-10-CM | POA: Diagnosis not present

## 2016-06-08 DIAGNOSIS — F329 Major depressive disorder, single episode, unspecified: Secondary | ICD-10-CM | POA: Insufficient documentation

## 2016-06-08 DIAGNOSIS — Z800 Family history of malignant neoplasm of digestive organs: Secondary | ICD-10-CM | POA: Diagnosis not present

## 2016-06-08 DIAGNOSIS — E119 Type 2 diabetes mellitus without complications: Secondary | ICD-10-CM | POA: Diagnosis not present

## 2016-06-08 DIAGNOSIS — Z7984 Long term (current) use of oral hypoglycemic drugs: Secondary | ICD-10-CM | POA: Diagnosis not present

## 2016-06-08 DIAGNOSIS — M1990 Unspecified osteoarthritis, unspecified site: Secondary | ICD-10-CM | POA: Insufficient documentation

## 2016-06-08 DIAGNOSIS — Z86718 Personal history of other venous thrombosis and embolism: Secondary | ICD-10-CM | POA: Diagnosis not present

## 2016-06-08 DIAGNOSIS — G4700 Insomnia, unspecified: Secondary | ICD-10-CM | POA: Insufficient documentation

## 2016-06-08 DIAGNOSIS — I10 Essential (primary) hypertension: Secondary | ICD-10-CM | POA: Diagnosis not present

## 2016-06-08 DIAGNOSIS — Z79899 Other long term (current) drug therapy: Secondary | ICD-10-CM

## 2016-06-08 DIAGNOSIS — C50212 Malignant neoplasm of upper-inner quadrant of left female breast: Secondary | ICD-10-CM | POA: Diagnosis not present

## 2016-06-08 DIAGNOSIS — Z79811 Long term (current) use of aromatase inhibitors: Secondary | ICD-10-CM | POA: Diagnosis not present

## 2016-06-08 DIAGNOSIS — Z170 Estrogen receptor positive status [ER+]: Secondary | ICD-10-CM | POA: Insufficient documentation

## 2016-06-08 DIAGNOSIS — Z1231 Encounter for screening mammogram for malignant neoplasm of breast: Secondary | ICD-10-CM

## 2016-06-08 DIAGNOSIS — Z915 Personal history of self-harm: Secondary | ICD-10-CM | POA: Diagnosis not present

## 2016-06-08 MED ORDER — LEVOFLOXACIN 500 MG PO TABS: 500.0000 mg | ORAL_TABLET | Freq: Every day | ORAL | 0 refills | Status: DC

## 2016-06-08 NOTE — Progress Notes (Signed)
Patient ambulates via wheelchair.  Brought to exam room 7.  Patient states she has chronic knee pain from OA.  Vitals documented, medication record updated, information provided by patient

## 2016-06-09 ENCOUNTER — Other Ambulatory Visit: Payer: Self-pay | Admitting: *Deleted

## 2016-10-10 NOTE — Progress Notes (Deleted)
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  INTERVAL HISTORY: Patient returns to clinic today for routine 3 month evaluation. She is tolerating letrozole well without significant side effects. She has no neurologic complaints. She denies any recent fevers or illnesses. She has a good appetite and denies weight loss. She denies any nausea, vomiting, constipation, or diarrhea. She has no chest pain or shortness of breath. She has no urinary complaints. Patient offers no specific complaints today.  REVIEW OF SYSTEMS:   Review of Systems  Constitutional: Negative for fever, malaise/fatigue and weight loss.  Respiratory: Negative.  Negative for shortness of breath.   Cardiovascular: Negative.  Negative for chest pain.  Gastrointestinal: Negative.  Negative for abdominal pain.  Musculoskeletal: Negative.   Neurological: Negative.  Negative for weakness.  Psychiatric/Behavioral: The patient is nervous/anxious.     As per HPI. Otherwise, a complete review of systems is negatve.  PAST MEDICAL HISTORY: Past Medical History:  Diagnosis Date  . Anxiety   . Depression   . Diabetes mellitus without complication ([place])   . DVT (deep vein thrombosis) in pregnancy ([place])   . Hyperlipidemia   . Hypertension   . Insomnia   . Osteoarthritis   . Rectal bleeding   . Stroke ([place])   . Suicidal ideation     PAST SURGICAL HISTORY: Past Surgical History:  Procedure Laterality Date  . AXILLARY LYMPH NODE DISSECTION Left 12/08/2015   Procedure: AXILLARY LYMPH NODE DISSECTION;  Surgeon: Leonie Green, MD;  Location: [facility] ORS;  Service: General;  Laterality: Left;  POSSIBLE AXILLARY NODE DISSECTION  . BREAST BIOPSY Left  10/27/2015   path pending  . COLONOSCOPY    . PARTIAL MASTECTOMY WITH AXILLARY SENTINEL LYMPH NODE BIOPSY Left 12/08/2015   Procedure: LEFT MASTECTOMY WITH AXILLARY SENTINEL LYMPH NODE BIOPSY;  Surgeon: Leonie Green, MD;  Location: [facility] ORS;  Service: General;  Laterality: Left;  Marland Kitchen VEIN BYPASS SURGERY      FAMILY HISTORY Family History  Problem Relation Age of Onset  . Pancreatic cancer Father 68  . Colon cancer Paternal Uncle   . Pancreatic cancer    . Heart failure Mother        ADVANCED DIRECTIVES:    HEALTH MAINTENANCE: Social History  Substance Use Topics  . Smoking status: Never Smoker  . Smokeless tobacco: Never Used  . Alcohol use No     Colonoscopy:  PAP:  Bone density:  Lipid panel:  Allergies  Allergen Reactions  . Hydrocodone-Acetaminophen Other (See Comments)    Increased dream activity  . Meperidine Other (See Comments)    Rapid heart rate  . Penicillins Rash    Penicillins group    Current Outpatient Prescriptions  Medication Sig Dispense Refill  . ALPRAZolam (XANAX) 0.5 MG tablet Take 1 tablet by mouth 4 (four) times daily as needed.    . etodolac (LODINE) 500 MG tablet Take 1 tablet by mouth 2 (two) times daily.    . fluticasone (FLONASE) 50 MCG/ACT nasal spray Place 2 sprays into the nose at bedtime.    Marland Kitchen glimepiride (AMARYL) 2 MG tablet TAKE 1 TABLET (2 MG TOTAL) BY MOUTH DAILY WITH BREAKFAST.  11  . imipramine (TOFRANIL) 50 MG tablet Take 2 tablets by mouth at  bedtime.    Marland Kitchen letrozole (FEMARA) 2.5 MG tablet Take 1 tablet (2.5 mg total) by mouth daily. 30 tablet 3  . levofloxacin (LEVAQUIN) 500 MG tablet Take 1 tablet (500 mg total) by mouth daily. 5 tablet 0  . lisinopril (PRINIVIL,ZESTRIL) 20 MG tablet Take 1 tablet by mouth daily.    . metFORMIN (GLUCOPHAGE) 1000 MG tablet Take 500 mg by mouth 2 (two) times daily.    Marland Kitchen PARoxetine (PAXIL) 40 MG tablet Take 1 tablet by mouth daily.    . QUEtiapine (SEROQUEL) 300 MG tablet Take 1 tablet by  mouth at bedtime.    . simvastatin (ZOCOR) 40 MG tablet Take 1 tablet by mouth at bedtime.    . temazepam (RESTORIL) 15 MG capsule Take 3 capsules by mouth at bedtime.     No current facility-administered medications for this visit.     OBJECTIVE: There were no vitals filed for this visit.   There is no height or weight on file to calculate BMI.    ECOG FS:0 - Asymptomatic  General: Well-developed, well-nourished, no acute distress. Eyes: Pink conjunctiva, anicteric sclera. Breasts: Left chest wall and axilla without evidence of recurrence. Right breast and axilla without lumps or masses. Lungs: Clear to auscultation bilaterally. Heart: Regular rate and rhythm. No rubs, murmurs, or gallops. Abdomen: Soft, nontender, nondistended. No organomegaly noted, normoactive bowel sounds. Musculoskeletal: No edema, cyanosis, or clubbing. Neuro: Alert, answering all questions appropriately. Cranial nerves grossly intact. Skin: No rashes or petechiae noted. Psych: Normal affect.   LAB RESULTS:  Lab Results  Component Value Date   NA 138 11/30/2015   K 3.8 11/30/2015   CL 104 11/30/2015   CO2 27 11/30/2015   GLUCOSE 138 (H) 11/30/2015   BUN 25 (H) 11/30/2015   CREATININE 1.01 (H) 11/30/2015   CALCIUM 9.9 11/30/2015   PROT 7.2 11/30/2015   ALBUMIN 3.8 11/30/2015   AST 20 11/30/2015   ALT 19 11/30/2015   ALKPHOS 72 11/30/2015   BILITOT 0.5 11/30/2015   GFRNONAA 55 (L) 11/30/2015   GFRAA >60 11/30/2015    Lab Results  Component Value Date   WBC 8.9 11/30/2015   NEUTROABS 5.2 11/30/2015   HGB 13.6 11/30/2015   HCT 39.9 11/30/2015   MCV 89.8 11/30/2015   PLT 221 11/30/2015     STUDIES: No results found.  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 Oncotype DX score was low risk at 17.  PLAN:    1. Pathologic stage IA ER/PR positive, HER-2/neu not overexpressing adenocarcinoma of  the upper inner quadrant of the left breast: Given patient's low risk Oncotype score of 17 with a 10 year distant recurrence of approximately 11%, patient did not require adjuvant chemotherapy. She also elected for a simple mastectomy, therefore she did not require adjuvant XRT. Continue letrozole for 5 years completing in March 2022. Patient will require repeat mammogram in January 2018. Return to clinic in 6 months for further evaluation.  2. Hypertension: Blood pressure mildly elevated today. Continue current medications as prescribed.  Patient expressed understanding and was in agreement with this plan. She also understands that She can call clinic at any time with any questions, concerns, or complaints.   Breast cancer [facility])   Staging form: Breast, AJCC 7th Edition     Pathologic stage from 11/13/2015: Stage IA (T1c, N0, M0) - Signed by Lloyd Huger, MD on 11/13/2015   Lloyd Huger, MD  10/10/2016 9:50 PM

## 2016-10-11 ENCOUNTER — Inpatient Hospital Stay: Payer: Medicare Other | Admitting: Oncology

## 2016-10-31 ENCOUNTER — Inpatient Hospital Stay: Payer: Medicare Other | Admitting: Oncology

## 2016-10-31 NOTE — Progress Notes (Deleted)
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  INTERVAL HISTORY: Patient returns to clinic today for routine 3 month evaluation. She is tolerating letrozole well without significant side effects. She has no neurologic complaints. She denies any recent fevers or illnesses. She has a good appetite and denies weight loss. She denies any nausea, vomiting, constipation, or diarrhea. She has no chest pain or shortness of breath. She has no urinary complaints. Patient offers no specific complaints today.  REVIEW OF SYSTEMS:   Review of Systems  Constitutional: Negative for fever, malaise/fatigue and weight loss.  Respiratory: Negative.  Negative for shortness of breath.   Cardiovascular: Negative.  Negative for chest pain.  Gastrointestinal: Negative.  Negative for abdominal pain.  Musculoskeletal: Negative.   Neurological: Negative.  Negative for weakness.  Psychiatric/Behavioral: The patient is nervous/anxious.     As per HPI. Otherwise, a complete review of systems is negatve.  PAST MEDICAL HISTORY: Past Medical History:  Diagnosis Date  . Anxiety   . Depression   . Diabetes mellitus without complication ([place])   . DVT (deep vein thrombosis) in pregnancy ([place])   . Hyperlipidemia   . Hypertension   . Insomnia   . Osteoarthritis   . Rectal bleeding   . Stroke ([place])   . Suicidal ideation     PAST SURGICAL HISTORY: Past Surgical History:  Procedure Laterality Date  . AXILLARY LYMPH NODE DISSECTION Left 12/08/2015   Procedure: AXILLARY LYMPH NODE DISSECTION;  Surgeon: Leonie Green, MD;  Location: [facility] ORS;  Service: General;  Laterality: Left;  POSSIBLE AXILLARY NODE DISSECTION  . BREAST BIOPSY Left  10/27/2015   path pending  . COLONOSCOPY    . PARTIAL MASTECTOMY WITH AXILLARY SENTINEL LYMPH NODE BIOPSY Left 12/08/2015   Procedure: LEFT MASTECTOMY WITH AXILLARY SENTINEL LYMPH NODE BIOPSY;  Surgeon: Leonie Green, MD;  Location: [facility] ORS;  Service: General;  Laterality: Left;  Marland Kitchen VEIN BYPASS SURGERY      FAMILY HISTORY Family History  Problem Relation Age of Onset  . Pancreatic cancer Father 70  . Colon cancer Paternal Uncle   . Pancreatic cancer    . Heart failure Mother        ADVANCED DIRECTIVES:    HEALTH MAINTENANCE: Social History  Substance Use Topics  . Smoking status: Never Smoker  . Smokeless tobacco: Never Used  . Alcohol use No     Colonoscopy:  PAP:  Bone density:  Lipid panel:  Allergies  Allergen Reactions  . Hydrocodone-Acetaminophen Other (See Comments)    Increased dream activity  . Meperidine Other (See Comments)    Rapid heart rate  . Penicillins Rash    Penicillins group    Current Outpatient Prescriptions  Medication Sig Dispense Refill  . ALPRAZolam (XANAX) 0.5 MG tablet Take 1 tablet by mouth 4 (four) times daily as needed.    . etodolac (LODINE) 500 MG tablet Take 1 tablet by mouth 2 (two) times daily.    . fluticasone (FLONASE) 50 MCG/ACT nasal spray Place 2 sprays into the nose at bedtime.    Marland Kitchen glimepiride (AMARYL) 2 MG tablet TAKE 1 TABLET (2 MG TOTAL) BY MOUTH DAILY WITH BREAKFAST.  11  . imipramine (TOFRANIL) 50 MG tablet Take 2 tablets by mouth at  bedtime.    Marland Kitchen letrozole (FEMARA) 2.5 MG tablet Take 1 tablet (2.5 mg total) by mouth daily. 30 tablet 3  . levofloxacin (LEVAQUIN) 500 MG tablet Take 1 tablet (500 mg total) by mouth daily. 5 tablet 0  . lisinopril (PRINIVIL,ZESTRIL) 20 MG tablet Take 1 tablet by mouth daily.    . metFORMIN (GLUCOPHAGE) 1000 MG tablet Take 500 mg by mouth 2 (two) times daily.    Marland Kitchen PARoxetine (PAXIL) 40 MG tablet Take 1 tablet by mouth daily.    . QUEtiapine (SEROQUEL) 300 MG tablet Take 1 tablet by  mouth at bedtime.    . simvastatin (ZOCOR) 40 MG tablet Take 1 tablet by mouth at bedtime.    . temazepam (RESTORIL) 15 MG capsule Take 3 capsules by mouth at bedtime.     No current facility-administered medications for this visit.     OBJECTIVE: There were no vitals filed for this visit.   There is no height or weight on file to calculate BMI.    ECOG FS:0 - Asymptomatic  General: Well-developed, well-nourished, no acute distress. Eyes: Pink conjunctiva, anicteric sclera. Breasts: Left chest wall and axilla without evidence of recurrence. Right breast and axilla without lumps or masses. Lungs: Clear to auscultation bilaterally. Heart: Regular rate and rhythm. No rubs, murmurs, or gallops. Abdomen: Soft, nontender, nondistended. No organomegaly noted, normoactive bowel sounds. Musculoskeletal: No edema, cyanosis, or clubbing. Neuro: Alert, answering all questions appropriately. Cranial nerves grossly intact. Skin: No rashes or petechiae noted. Psych: Normal affect.   LAB RESULTS:  Lab Results  Component Value Date   NA 138 11/30/2015   K 3.8 11/30/2015   CL 104 11/30/2015   CO2 27 11/30/2015   GLUCOSE 138 (H) 11/30/2015   BUN 25 (H) 11/30/2015   CREATININE 1.01 (H) 11/30/2015   CALCIUM 9.9 11/30/2015   PROT 7.2 11/30/2015   ALBUMIN 3.8 11/30/2015   AST 20 11/30/2015   ALT 19 11/30/2015   ALKPHOS 72 11/30/2015   BILITOT 0.5 11/30/2015   GFRNONAA 55 (L) 11/30/2015   GFRAA >60 11/30/2015    Lab Results  Component Value Date   WBC 8.9 11/30/2015   NEUTROABS 5.2 11/30/2015   HGB 13.6 11/30/2015   HCT 39.9 11/30/2015   MCV 89.8 11/30/2015   PLT 221 11/30/2015     STUDIES: No results found.  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 Oncotype DX score was low risk at 17.  PLAN:    1. Pathologic stage IA ER/PR positive, HER-2/neu not overexpressing adenocarcinoma of  the upper inner quadrant of the left breast: Given patient's low risk Oncotype score of 17 with a 10 year distant recurrence of approximately 11%, patient did not require adjuvant chemotherapy. She also elected for a simple mastectomy, therefore she did not require adjuvant XRT. Continue letrozole for 5 years completing in March 2022. Patient will require repeat mammogram in January 2018. Return to clinic in 6 months for further evaluation.  2. Hypertension: Blood pressure mildly elevated today. Continue current medications as prescribed.  Patient expressed understanding and was in agreement with this plan. She also understands that She can call clinic at any time with any questions, concerns, or complaints.   Breast cancer [facility])   Staging form: Breast, AJCC 7th Edition     Pathologic stage from 11/13/2015: Stage IA (T1c, N0, M0) - Signed by Lloyd Huger, MD on 11/13/2015   Lloyd Huger, MD  10/31/2016 9:10 AM

## 2016-11-01 ENCOUNTER — Ambulatory Visit
Admission: RE | Admit: 2016-11-01 | Discharge: 2016-11-01 | Disposition: A | Discharge status: Home / Self Care | Payer: Medicare Other | Source: Ambulatory Visit | Attending: Oncology | Admitting: Oncology

## 2016-11-01 DIAGNOSIS — Z1231 Encounter for screening mammogram for malignant neoplasm of breast: Secondary | ICD-10-CM | POA: Diagnosis present

## 2016-11-09 ENCOUNTER — Inpatient Hospital Stay: Payer: Medicare Other | Admitting: Oncology

## 2016-11-21 NOTE — Progress Notes (Signed)
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  INTERVAL HISTORY: Patient returns to clinic today for routine 6 month evaluation. She continues to tolerate letrozole well without significant side effects. She has no neurologic complaints. She denies any recent fevers or illnesses. She has a good appetite and denies weight loss. She denies any nausea, vomiting, constipation, or diarrhea. She has no chest pain or shortness of breath. She has no urinary complaints. Patient offers no specific complaints today.  REVIEW OF SYSTEMS:   Review of Systems  Constitutional: Negative for fever, malaise/fatigue and weight loss.  Respiratory: Negative.  Negative for cough and shortness of breath.   Cardiovascular: Negative.  Negative for chest pain and leg swelling.  Gastrointestinal: Negative.  Negative for abdominal pain.  Musculoskeletal: Negative.   Neurological: Negative.  Negative for weakness.  Psychiatric/Behavioral: Negative.  The patient is not nervous/anxious.     As per HPI. Otherwise, a complete review of systems is negative.  PAST MEDICAL HISTORY: Past Medical History:  Diagnosis Date  . Anxiety   . Depression   . Diabetes mellitus without complication ([place])   . DVT (deep vein thrombosis) in pregnancy ([place])   . Hyperlipidemia   . Hypertension   . Insomnia   . Osteoarthritis   . Rectal bleeding   . Stroke ([place])   . Suicidal ideation     PAST SURGICAL HISTORY: Past Surgical History:  Procedure Laterality Date  . AXILLARY LYMPH NODE DISSECTION Left 12/08/2015   Procedure: AXILLARY LYMPH NODE DISSECTION;  Surgeon: Leonie Green, MD;  Location: [facility] ORS;  Service: General;  Laterality: Left;   POSSIBLE AXILLARY NODE DISSECTION  . BREAST BIOPSY Left 10/27/2015   path pending  . COLONOSCOPY    . MASTECTOMY Left 12/08/2015  . PARTIAL MASTECTOMY WITH AXILLARY SENTINEL LYMPH NODE BIOPSY Left 12/08/2015   Procedure: LEFT MASTECTOMY WITH AXILLARY SENTINEL LYMPH NODE BIOPSY;  Surgeon: Leonie Green, MD;  Location: [facility] ORS;  Service: General;  Laterality: Left;  Marland Kitchen VEIN BYPASS SURGERY      FAMILY HISTORY Family History  Problem Relation Age of Onset  . Pancreatic cancer Father 21  . Colon cancer Paternal Uncle   . Pancreatic cancer    . Heart failure Mother   . Breast cancer Mother        ADVANCED DIRECTIVES:    HEALTH MAINTENANCE: Social History  Substance Use Topics  . Smoking status: Never Smoker  . Smokeless tobacco: Never Used  . Alcohol use No     Colonoscopy:  PAP:  Bone density:  Lipid panel:  Allergies  Allergen Reactions  . Hydrocodone-Acetaminophen Other (See Comments)    Increased dream activity  . Meperidine Other (See Comments)    Rapid heart rate  . Penicillins Rash    Penicillins group    Current Outpatient Prescriptions  Medication Sig Dispense Refill  . ALPRAZolam (XANAX) 0.5 MG tablet Take 1 tablet by mouth 4 (four) times daily as needed.    . etodolac (LODINE) 500 MG tablet Take 1 tablet by mouth 2 (two) times daily.    Marland Kitchen imipramine (TOFRANIL) 50 MG tablet Take 2 tablets by mouth at bedtime.    Marland Kitchen letrozole (FEMARA) 2.5 MG tablet Take 1 tablet (2.5 mg total) by mouth  daily. 30 tablet 3  . lisinopril (PRINIVIL,ZESTRIL) 20 MG tablet Take 1 tablet by mouth daily.    . metFORMIN (GLUCOPHAGE) 1000 MG tablet Take 500 mg by mouth 2 (two) times daily.    Marland Kitchen PARoxetine (PAXIL) 40 MG tablet Take 1 tablet by mouth daily.    . QUEtiapine (SEROQUEL) 300 MG tablet Take 1 tablet by mouth at bedtime.    . simvastatin (ZOCOR) 40 MG tablet Take 1 tablet by mouth at bedtime.    . temazepam (RESTORIL) 15 MG capsule Take 3 capsules by mouth at bedtime.      . clindamycin (CLEOCIN) 300 MG capsule Take 300 mg by mouth.     . fluticasone (FLONASE) 50 MCG/ACT nasal spray Place 2 sprays into the nose at bedtime.    Marland Kitchen glimepiride (AMARYL) 2 MG tablet TAKE 1 TABLET (2 MG TOTAL) BY MOUTH DAILY WITH BREAKFAST.  11   No current facility-administered medications for this visit.     OBJECTIVE: Vitals:   11/23/16 1611  BP: 116/79  Pulse: (!) 103  Resp: 18  Temp: 97.7 F (36.5 C)     There is no height or weight on file to calculate BMI.    ECOG FS:0 - Asymptomatic  General: Well-developed, well-nourished, no acute distress. Eyes: Pink conjunctiva, anicteric sclera. Breasts: Left chest wall and axilla without evidence of recurrence. Right breast and axilla without lumps or masses. Lungs: Clear to auscultation bilaterally. Heart: Regular rate and rhythm. No rubs, murmurs, or gallops. Abdomen: Soft, nontender, nondistended. No organomegaly noted, normoactive bowel sounds. Musculoskeletal: No edema, cyanosis, or clubbing. Neuro: Alert, answering all questions appropriately. Cranial nerves grossly intact. Skin: No rashes or petechiae noted. Psych: Normal affect.   LAB RESULTS:  Lab Results  Component Value Date   NA 138 11/30/2015   K 3.8 11/30/2015   CL 104 11/30/2015   CO2 27 11/30/2015   GLUCOSE 138 (H) 11/30/2015   BUN 25 (H) 11/30/2015   CREATININE 1.01 (H) 11/30/2015   CALCIUM 9.9 11/30/2015   PROT 7.2 11/30/2015   ALBUMIN 3.8 11/30/2015   AST 20 11/30/2015   ALT 19 11/30/2015   ALKPHOS 72 11/30/2015   BILITOT 0.5 11/30/2015   GFRNONAA 55 (L) 11/30/2015   GFRAA >60 11/30/2015    Lab Results  Component Value Date   WBC 8.9 11/30/2015   NEUTROABS 5.2 11/30/2015   HGB 13.6 11/30/2015   HCT 39.9 11/30/2015   MCV 89.8 11/30/2015   PLT 221 11/30/2015     STUDIES: Mm Screening Breast Tomo Uni R  Result Date: 11/02/2016 CLINICAL DATA:  Screening. EXAM: 2D DIGITAL SCREENING UNILATERAL RIGHT MAMMOGRAM WITH CAD AND ADJUNCT  TOMO COMPARISON:  Previous exam(s). ACR Breast Density Category b: There are scattered areas of fibroglandular density. FINDINGS: The patient has had a left mastectomy. There are no findings suspicious for malignancy. Images were processed with CAD. IMPRESSION: No mammographic evidence of malignancy. A result letter of this screening mammogram will be mailed directly to the patient. RECOMMENDATION: Screening mammogram in one year.  (Code:SM-R-30M) BI-RADS CATEGORY  1: Negative. Electronically Signed   By: Ammie Ferrier M.D.   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 Oncotype DX score was low risk at 17.  PLAN:    1. 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 She also elected for a simple mastectomy, therefore she did not require adjuvant XRT. Continue letrozole for 5 years completing in March 2022. Patient's most recent mammogram on November 02, 2016 was reported as BI-RADS 1, repeat in January 2019. Return to clinic in 6 months for further evaluation.  2. Hypertension: Blood pressure is within normal limits today. Continue current medications as prescribed. 3. Postmenopausal: Patient will require a DEXA scan in the near future.  Patient expressed understanding and was in agreement with this plan. She also understands that She can call clinic at any time with any questions, concerns, or complaints.   Breast cancer [facility])   Staging form: Breast, AJCC 7th Edition     Pathologic stage from 11/13/2015: Stage IA (T1c, N0, M0) - Signed by Lloyd Huger, MD on 11/13/2015   Lloyd Huger, MD   11/26/2016 9:03 AM

## 2016-11-23 ENCOUNTER — Inpatient Hospital Stay: Payer: Medicare Other | Attending: Oncology | Admitting: Oncology

## 2016-11-23 VITALS — BP 116/79 | HR 103 | Temp 97.7°F | Resp 18

## 2016-11-23 DIAGNOSIS — E785 Hyperlipidemia, unspecified: Secondary | ICD-10-CM | POA: Insufficient documentation

## 2016-11-23 DIAGNOSIS — Z915 Personal history of self-harm: Secondary | ICD-10-CM | POA: Diagnosis not present

## 2016-11-23 DIAGNOSIS — F419 Anxiety disorder, unspecified: Secondary | ICD-10-CM | POA: Diagnosis not present

## 2016-11-23 DIAGNOSIS — Z800 Family history of malignant neoplasm of digestive organs: Secondary | ICD-10-CM | POA: Insufficient documentation

## 2016-11-23 DIAGNOSIS — Z7984 Long term (current) use of oral hypoglycemic drugs: Secondary | ICD-10-CM | POA: Diagnosis not present

## 2016-11-23 DIAGNOSIS — Z803 Family history of malignant neoplasm of breast: Secondary | ICD-10-CM | POA: Diagnosis not present

## 2016-11-23 DIAGNOSIS — E119 Type 2 diabetes mellitus without complications: Secondary | ICD-10-CM | POA: Insufficient documentation

## 2016-11-23 DIAGNOSIS — F329 Major depressive disorder, single episode, unspecified: Secondary | ICD-10-CM | POA: Insufficient documentation

## 2016-11-23 DIAGNOSIS — Z79899 Other long term (current) drug therapy: Secondary | ICD-10-CM | POA: Diagnosis not present

## 2016-11-23 DIAGNOSIS — Z780 Asymptomatic menopausal state: Secondary | ICD-10-CM

## 2016-11-23 DIAGNOSIS — Z8673 Personal history of transient ischemic attack (TIA), and cerebral infarction without residual deficits: Secondary | ICD-10-CM | POA: Diagnosis not present

## 2016-11-23 DIAGNOSIS — C50212 Malignant neoplasm of upper-inner quadrant of left female breast: Secondary | ICD-10-CM

## 2016-11-23 DIAGNOSIS — M1990 Unspecified osteoarthritis, unspecified site: Secondary | ICD-10-CM | POA: Diagnosis not present

## 2016-11-23 DIAGNOSIS — G4700 Insomnia, unspecified: Secondary | ICD-10-CM | POA: Insufficient documentation

## 2016-11-23 DIAGNOSIS — Z9012 Acquired absence of left breast and nipple: Secondary | ICD-10-CM

## 2016-11-23 DIAGNOSIS — Z86718 Personal history of other venous thrombosis and embolism: Secondary | ICD-10-CM | POA: Diagnosis not present

## 2016-11-23 DIAGNOSIS — Z79811 Long term (current) use of aromatase inhibitors: Secondary | ICD-10-CM

## 2016-11-23 DIAGNOSIS — Z170 Estrogen receptor positive status [ER+]: Secondary | ICD-10-CM | POA: Insufficient documentation

## 2016-11-23 DIAGNOSIS — I10 Essential (primary) hypertension: Secondary | ICD-10-CM | POA: Diagnosis not present

## 2016-11-29 ENCOUNTER — Encounter: Payer: Self-pay | Admitting: *Deleted

## 2016-11-29 ENCOUNTER — Emergency Department: Payer: Medicare Other

## 2016-11-29 ENCOUNTER — Emergency Department
Admission: EM | Admit: 2016-11-29 | Discharge: 2016-11-30 | Disposition: A | Discharge status: Home / Self Care | Payer: Medicare Other | Attending: Emergency Medicine | Admitting: Emergency Medicine

## 2016-11-29 DIAGNOSIS — R112 Nausea with vomiting, unspecified: Secondary | ICD-10-CM

## 2016-11-29 DIAGNOSIS — Z859 Personal history of malignant neoplasm, unspecified: Secondary | ICD-10-CM | POA: Diagnosis not present

## 2016-11-29 DIAGNOSIS — E1165 Type 2 diabetes mellitus with hyperglycemia: Secondary | ICD-10-CM | POA: Insufficient documentation

## 2016-11-29 DIAGNOSIS — R739 Hyperglycemia, unspecified: Secondary | ICD-10-CM

## 2016-11-29 DIAGNOSIS — Z7984 Long term (current) use of oral hypoglycemic drugs: Secondary | ICD-10-CM | POA: Diagnosis not present

## 2016-11-29 DIAGNOSIS — R1114 Bilious vomiting: Secondary | ICD-10-CM | POA: Insufficient documentation

## 2016-11-29 DIAGNOSIS — R1032 Left lower quadrant pain: Secondary | ICD-10-CM | POA: Diagnosis present

## 2016-11-29 DIAGNOSIS — Z79899 Other long term (current) drug therapy: Secondary | ICD-10-CM | POA: Insufficient documentation

## 2016-11-29 DIAGNOSIS — I10 Essential (primary) hypertension: Secondary | ICD-10-CM | POA: Insufficient documentation

## 2016-11-29 DIAGNOSIS — R109 Unspecified abdominal pain: Secondary | ICD-10-CM

## 2016-11-29 HISTORY — DX: Malignant (primary) neoplasm, unspecified: C80.1

## 2016-11-29 LAB — URINALYSIS, COMPLETE (UACMP) WITH MICROSCOPIC
BACTERIA UA: NONE SEEN
BILIRUBIN URINE: NEGATIVE
Glucose, UA: >=500 mg/dL — AB
Hgb urine dipstick: NEGATIVE
KETONES UR: 20 mg/dL — AB
LEUKOCYTES UA: NEGATIVE
NITRITE: NEGATIVE
PH: 6 (ref 5.0–8.0)
Protein, ur: NEGATIVE mg/dL
Specific Gravity, Urine: 1.023 (ref 1.005–1.030)

## 2016-11-29 LAB — BASIC METABOLIC PANEL
ANION GAP: 13 (ref 5–15)
BUN: 13 mg/dL (ref 6–20)
CO2: 21 mmol/L — ABNORMAL LOW (ref 22–32)
Calcium: 9.5 mg/dL (ref 8.9–10.3)
Chloride: 94 mmol/L — ABNORMAL LOW (ref 101–111)
Creatinine, Ser: 1.07 mg/dL — ABNORMAL HIGH (ref 0.44–1.00)
GFR calc Af Amer: 59 mL/min — ABNORMAL LOW (ref >60)
GFR, EST NON AFRICAN AMERICAN: 51 mL/min — AB (ref >60)
Glucose, Bld: 426 mg/dL — ABNORMAL HIGH (ref 65–99)
POTASSIUM: 3.6 mmol/L (ref 3.5–5.1)
SODIUM: 128 mmol/L — AB (ref 135–145)

## 2016-11-29 LAB — CBC
HEMATOCRIT: 46.5 % (ref 35.0–47.0)
Hemoglobin: 15.9 g/dL (ref 12.0–16.0)
MCH: 30.4 pg (ref 26.0–34.0)
MCHC: 34.1 g/dL (ref 32.0–36.0)
MCV: 89.1 fL (ref 80.0–100.0)
Platelets: 211 10*3/uL (ref 150–440)
RBC: 5.22 MIL/uL — AB (ref 3.80–5.20)
RDW: 13 % (ref 11.5–14.5)
WBC: 9 10*3/uL (ref 3.6–11.0)

## 2016-11-29 LAB — HEPATIC FUNCTION PANEL
ALBUMIN: 4 g/dL (ref 3.5–5.0)
ALT: 29 U/L (ref 14–54)
AST: 39 U/L (ref 15–41)
Alkaline Phosphatase: 85 U/L (ref 38–126)
BILIRUBIN DIRECT: 0.1 mg/dL (ref 0.1–0.5)
BILIRUBIN TOTAL: 0.7 mg/dL (ref 0.3–1.2)
Indirect Bilirubin: 0.6 mg/dL (ref 0.3–0.9)
Total Protein: 7.9 g/dL (ref 6.5–8.1)

## 2016-11-29 LAB — GLUCOSE, CAPILLARY
GLUCOSE-CAPILLARY: 424 mg/dL — AB (ref 65–99)
Glucose-Capillary: 437 mg/dL — ABNORMAL HIGH (ref 65–99)
Glucose-Capillary: 318 mg/dL — ABNORMAL HIGH (ref 65–99)

## 2016-11-29 LAB — TROPONIN I: Troponin I: <0.03 ng/mL (ref <0.03)

## 2016-11-29 LAB — LIPASE, BLOOD: Lipase: 18 U/L (ref 11–51)

## 2016-11-29 MED ORDER — ONDANSETRON HCL 4 MG/2ML IJ SOLN
4.0000 mg | Freq: Once | INTRAMUSCULAR | Status: AC
  Administered 2016-11-29: 4 mg via INTRAVENOUS
  Filled 2016-11-29: qty 2

## 2016-11-29 MED ORDER — IOPAMIDOL (ISOVUE-300) INJECTION 61%
30.0000 mL | Freq: Once | INTRAVENOUS | Status: AC | PRN
  Administered 2016-11-29: 30 mL via ORAL

## 2016-11-29 MED ORDER — SODIUM CHLORIDE 0.9 % IV BOLUS (SEPSIS)
1000.0000 mL | Freq: Once | INTRAVENOUS | Status: AC
  Administered 2016-11-29: 1000 mL via INTRAVENOUS

## 2016-11-29 MED ORDER — IOPAMIDOL (ISOVUE-300) INJECTION 61%
100.0000 mL | Freq: Once | INTRAVENOUS | Status: AC | PRN
  Administered 2016-11-29: 100 mL via INTRAVENOUS

## 2016-11-29 MED ORDER — INSULIN ASPART 100 UNIT/ML ~~LOC~~ SOLN
5.0000 [IU] | Freq: Once | SUBCUTANEOUS | Status: AC
  Administered 2016-11-29: 5 [IU] via INTRAVENOUS
  Filled 2016-11-29: qty 5

## 2016-11-29 NOTE — ED Notes (Signed)
Pt to CT via stretcher accomp by CT tech 

## 2016-11-29 NOTE — ED Notes (Signed)
Pt uprite on stretcher in exam room with no distress noted; pt  Denies any c/o at present

## 2016-11-29 NOTE — ED Notes (Signed)
CT notified pt completed drinking contrast 

## 2016-11-29 NOTE — ED Notes (Signed)
Dr Clearnce Hasten at bedside for evaluation

## 2016-11-29 NOTE — ED Notes (Signed)
CT tech to bedside with PO contrast....allergies reviewed with patient...instructions for administration of PO contrast reviewed with patient -- patient verbalizes understanding of process. RN to f/u with and encourage patient to consume PO contrast volume. Patient to notify RN when volume completed and for any difficulties experienced while drinking --Patient verbalizes understanding  

## 2016-11-29 NOTE — ED Notes (Signed)
PCXR completed as ordered 

## 2016-11-29 NOTE — ED Triage Notes (Signed)
Patient presents to the ED from Dr. Keturah Barre office with hyperglycemia, nausea and vomiting.  Patient reports vomiting x 3 today.  Patient's husband states that patient has not taken her metformin in about 1 year but he did not know until recently.  Patient and husband not getting along during triage.  Patient is complaining of feeling very cold, has several jackets and sweaters on.  Patient appears diaphoretic.  Patient states, "I should have come here by EMS."  This RN attempted to reassure patient that she will receive appropriate care.

## 2016-11-29 NOTE — ED Provider Notes (Signed)
[facility] Emergency Department Provider Note  ____________________________________________   First MD Initiated Contact with Patient 11/29/16 2100     (approximate)  I have reviewed the triage vital signs and the nursing notes.   HISTORY  Chief Complaint Hyperglycemia and Emesis   HPI Yvette Tyler is a 71 y.o. female with a history of diabetes who is noncompliant with her metformin who is presenting to the emergency department today with hypoglycemia as well as vomiting and left lower quadrant abdominal pain. The patient says that she saw her primary care doctor several days ago who wanted her to be evaluated in the hospital for hyperglycemia. The patient says she waited a day before coming to the hospital now on the way to the hospital vomited a green vomit. She says that she is also had some mild intermittent left lower quadrant abdominal pain. She says that she has metformin at home but has not been taking it for about the past year. Denies any abdominal pain at this time. Denies any diarrhea. Says that her husband was also sick over the weekend with a viral illness but only had nasal congestion and cough. The patient says that she has been coughing but has not had any nasal congestion.   Past Medical History:  Diagnosis Date  . Anxiety   . Cancer ([place])   . Depression   . Diabetes mellitus without complication ([place])   . DVT (deep vein thrombosis) in pregnancy ([place])   . Hyperlipidemia   . Hypertension   . Insomnia   . Osteoarthritis   . Rectal bleeding   . Stroke ([place])   . Suicidal ideation     Patient Active Problem List   Diagnosis Date Noted  . Primary cancer of upper inner quadrant of left female breast ([place]) 11/13/2015    Past Surgical History:  Procedure Laterality Date  . AXILLARY LYMPH NODE DISSECTION Left 12/08/2015   Procedure: AXILLARY LYMPH NODE DISSECTION;  Surgeon: Leonie Green, MD;  Location: [facility] ORS;  Service:  General;  Laterality: Left;  POSSIBLE AXILLARY NODE DISSECTION  . BREAST BIOPSY Left 10/27/2015   path pending  . COLONOSCOPY    . MASTECTOMY Left 12/08/2015  . PARTIAL MASTECTOMY WITH AXILLARY SENTINEL LYMPH NODE BIOPSY Left 12/08/2015   Procedure: LEFT MASTECTOMY WITH AXILLARY SENTINEL LYMPH NODE BIOPSY;  Surgeon: Leonie Green, MD;  Location: [facility] ORS;  Service: General;  Laterality: Left;  Marland Kitchen VEIN BYPASS SURGERY      Prior to Admission medications   Medication Sig Start Date End Date Taking? Authorizing Provider  ALPRAZolam Duanne Moron) 0.5 MG tablet Take 1 tablet by mouth 4 (four) times daily as needed.    Historical Provider, MD  clindamycin (CLEOCIN) 300 MG capsule Take 300 mg by mouth.  11/22/16 11/29/16  Historical Provider, MD  etodolac (LODINE) 500 MG tablet Take 1 tablet by mouth 2 (two) times daily. 10/12/15   Historical Provider, MD  fluticasone (FLONASE) 50 MCG/ACT nasal spray Place 2 sprays into the nose at bedtime. 09/22/15   Historical Provider, MD  glimepiride (AMARYL) 2 MG tablet TAKE 1 TABLET (2 MG TOTAL) BY MOUTH DAILY WITH BREAKFAST. 11/09/15   Historical Provider, MD  imipramine (TOFRANIL) 50 MG tablet Take 2 tablets by mouth at bedtime.    Historical Provider, MD  letrozole (FEMARA) 2.5 MG tablet Take 1 tablet (2.5 mg total) by mouth daily. 12/22/15   Lloyd Huger, MD  lisinopril (PRINIVIL,ZESTRIL) 20 MG tablet Take 1 tablet by  mouth daily. 10/08/15 11/23/16  Historical Provider, MD  metFORMIN (GLUCOPHAGE) 1000 MG tablet Take 500 mg by mouth 2 (two) times daily. 08/11/15   Historical Provider, MD  PARoxetine (PAXIL) 40 MG tablet Take 1 tablet by mouth daily.    Historical Provider, MD  QUEtiapine (SEROQUEL) 300 MG tablet Take 1 tablet by mouth at bedtime.    Historical Provider, MD  simvastatin (ZOCOR) 40 MG tablet Take 1 tablet by mouth at bedtime.    Historical Provider, MD  temazepam (RESTORIL) 15 MG capsule Take 3 capsules by mouth at bedtime.    Historical Provider,  MD    Allergies Hydrocodone-acetaminophen; Meperidine; and Penicillins  Family History  Problem Relation Age of Onset  . Pancreatic cancer Father 12  . Colon cancer Paternal Uncle   . Pancreatic cancer    . Heart failure Mother   . Breast cancer Mother     Social History Social History  Substance Use Topics  . Smoking status: Never Smoker  . Smokeless tobacco: Never Used  . Alcohol use No    Review of Systems Constitutional: No fever/chills Eyes: No visual changes. ENT: No sore throat. Cardiovascular: Denies chest pain. Respiratory: Denies shortness of breath. Gastrointestinal:  No diarrhea.  No constipation. Genitourinary: Negative for dysuria. Musculoskeletal: Negative for back pain. Skin: Negative for rash. Neurological: Negative for headaches, focal weakness or numbness.  10-point ROS otherwise negative.  ____________________________________________   PHYSICAL EXAM:  VITAL SIGNS: ED Triage Vitals  Enc Vitals Group     BP 11/29/16 1821 (!) 174/91     Pulse Rate 11/29/16 1821 (!) 119     Resp 11/29/16 1821 18     Temp 11/29/16 1821 98.1 F (36.7 C)     Temp Source 11/29/16 1821 Oral     SpO2 11/29/16 1821 97 %     Weight 11/29/16 1851 172 lb (78 kg)     Height 11/29/16 1851 5\' 7"  (1.702 m)     Head Circumference --      Peak Flow --      Pain Score --      Pain Loc --      Pain Edu? --      Excl. in [place]? --     Constitutional: Alert and oriented. Well appearing and in no acute distress.  Green vomitus on the patient's shirt.   Eyes: Conjunctivae are normal. PERRL. EOMI. Head: Atraumatic. Nose: No congestion/rhinnorhea. Mouth/Throat: Mucous membranes are moist.   Neck: No stridor.   Cardiovascular: Normal rate, regular rhythm. Grossly normal heart sounds.   Respiratory: Normal respiratory effort.  No retractions. Lungs CTAB. Gastrointestinal: Soft and nontender. No distention.  Musculoskeletal: No lower extremity tenderness nor edema.  No joint  effusions. Neurologic:  Normal speech and language. No gross focal neurologic deficits are appreciated.  Skin:  Skin is warm, dry and intact. No rash noted. Psychiatric: Mood and affect are normal. Speech and behavior are normal.  ____________________________________________   LABS (all labs ordered are listed, but only abnormal results are displayed)  Labs Reviewed  BASIC METABOLIC PANEL - Abnormal; Notable for the following:       Result Value   Sodium 128 (*)    Chloride 94 (*)    CO2 21 (*)    Glucose, Bld 426 (*)    Creatinine, Ser 1.07 (*)    GFR calc non Af Amer 51 (*)    GFR calc Af Amer 59 (*)    All other components within normal limits  CBC - Abnormal; Notable for the following:    RBC 5.22 (*)    All other components within normal limits  GLUCOSE, CAPILLARY - Abnormal; Notable for the following:    Glucose-Capillary 437 (*)    All other components within normal limits  GLUCOSE, CAPILLARY - Abnormal; Notable for the following:    Glucose-Capillary 424 (*)    All other components within normal limits  GLUCOSE, CAPILLARY - Abnormal; Notable for the following:    Glucose-Capillary 318 (*)    All other components within normal limits  LIPASE, BLOOD  HEPATIC FUNCTION PANEL  TROPONIN I  URINALYSIS, COMPLETE (UACMP) WITH MICROSCOPIC  CBG MONITORING, ED  CBG MONITORING, ED  CBG MONITORING, ED   ____________________________________________  EKG  ED ECG REPORT I, [place],  Youlanda Roys, the attending physician, personally viewed and interpreted this ECG.   Date: 11/29/2016  EKG Time: 1230  Rate: 93   Rhythm: normal sinus rhythm  Axis: normal  Intervals:none  ST&T Change: No ST segment elevation or depression. No abnormal T-wave inversion.  ____________________________________________  RADIOLOGY  DG Chest 1 View (Accession [id number]) (Order MD:[id number])  Imaging  Date: 11/29/2016 Department: [facility] EMERGENCY DEPARTMENT Released  By/Authorizing: Orbie Pyo, MD (auto-released)  Exam Information   Status Exam Begun  Exam Ended   Final [99] 11/29/2016 9:30 PM 11/29/2016 9:42 PM  PACS Images   Show images for DG Chest 1 View  Study Result   CLINICAL DATA:  71 y/o  F; hyperglycemia, nausea, vomiting, cough.  EXAM: CHEST 1 VIEW  COMPARISON:  10/12/2012 chest radiograph.  FINDINGS: Stable cardiac silhouette within normal limits given projection and technique. Chronic left-sided rib fractures. No focal consolidation. No pleural effusion. No acute osseous abnormality.  IMPRESSION: No active disease.   Electronically Signed   By: Kristine Garbe M.D.   On: 11/29/2016 22:05    ____________________________________________   PROCEDURES  Procedure(s) performed:   Procedures  Critical Care performed:   ____________________________________________   INITIAL IMPRESSION / ASSESSMENT AND PLAN / ED COURSE  Pertinent labs & imaging results that were available during my care of the patient were reviewed by me and considered in my medical decision making (see chart for details).  ----------------------------------------- 11:20 PM on 11/29/2016 ----------------------------------------- After a liter of fluids the patient's blood sugar barely lowered. She was given a liter then 5 units of insulin aspart. Still pending her CT of the abdomen and pelvis. Signed out to Dr. Beather Arbour      ____________________________________________   FINAL CLINICAL IMPRESSION(S) / ED DIAGNOSES  Hyperglycemia. Bilious vomiting. Abdominal pain.    NEW MEDICATIONS STARTED DURING THIS VISIT:  New Prescriptions   No medications on file     Note:  This document was prepared using Dragon voice recognition software and may include unintentional dictation errors.    Orbie Pyo, MD 11/29/16 [phone]

## 2016-11-29 NOTE — ED Notes (Addendum)
Pt uprite on stretcher in ED exam room with no distress noted, watching TV; husband at bedside; pt st that she was seen by Dr Candiss Norse this week and called to today to come into the ED to receive IVFs because of her hyperglycemia; pt's husband st that she has been off of her diabetic meds for probably the last year; pt st has had some nausea today and vomited en route; st only mild nausea at present and denies any c/o pain; +BS, abd soft/nondist/tender to left lower quad; pt st "I have had some problems with my ovary for last couple months"; pt also reports has had recent falls but denies any known injuries; pt also reports that she was dx recently with bronchitis and COPD though she has never been a smoker; st occas productive cough yellow sputum; left mastectomy Feb 2017 for breast CA and family reports strong family hx of CA; pt placed in hosp gown & on card monitor for further evaluation

## 2016-11-30 MED ORDER — ONDANSETRON 4 MG PO TBDP: 4.0000 mg | ORAL_TABLET | Freq: Three times a day (TID) | ORAL | 0 refills | Status: DC | PRN

## 2016-11-30 NOTE — ED Provider Notes (Signed)
-----------------------------------------   12:27 AM on 11/30/2016 -----------------------------------------  CT abdomen and pelvis interpreted per Dr. Jeannine Kitten: Large segments of the colon are under distended. Partial colonic  interposition with cecum located superiorly. Nonvisualized appendix.  No extra luminal bowel inflammatory process, free fluid or free air.    No gastric or duodenal abnormality noted.    Fatty liver.    Degenerative changes lumbar spine.   Updated patient and spouse on CT and urinalysis results. Sugar improved. Patient was strongly encouraged to take her diabetes medications as well as all of her daily medications prescribed by her doctor. Strict return precautions given. Patient verbalizes understanding and agrees with plan of care.   Paulette Blanch, MD 11/30/16 [phone]

## 2016-11-30 NOTE — Discharge Instructions (Signed)
1. You may take Zofran as needed for nausea/vomiting. 2. Clear liquids 12 hours, then bland diet 3 days, then slowly advance diet as tolerated. 3. You must take all of your medicines daily as directed by your doctor. 4. Return to the ER for worsening symptoms, persistent vomiting, difficulty breathing or other concerns.

## 2016-12-13 ENCOUNTER — Ambulatory Visit: Payer: Medicare Other | Admitting: Oncology

## 2017-01-09 ENCOUNTER — Ambulatory Visit: Payer: Medicare Other

## 2017-02-06 IMAGING — DX DG CHEST 1V
1 series · 1 of 1 positions shown · non-contrast
Comparison: 10/12/2012 chest radiograph.

CLINICAL DATA: 70 y/o  F; hyperglycemia, nausea, vomiting, cough.

EXAM:
CHEST 1 VIEW

[chest ap]
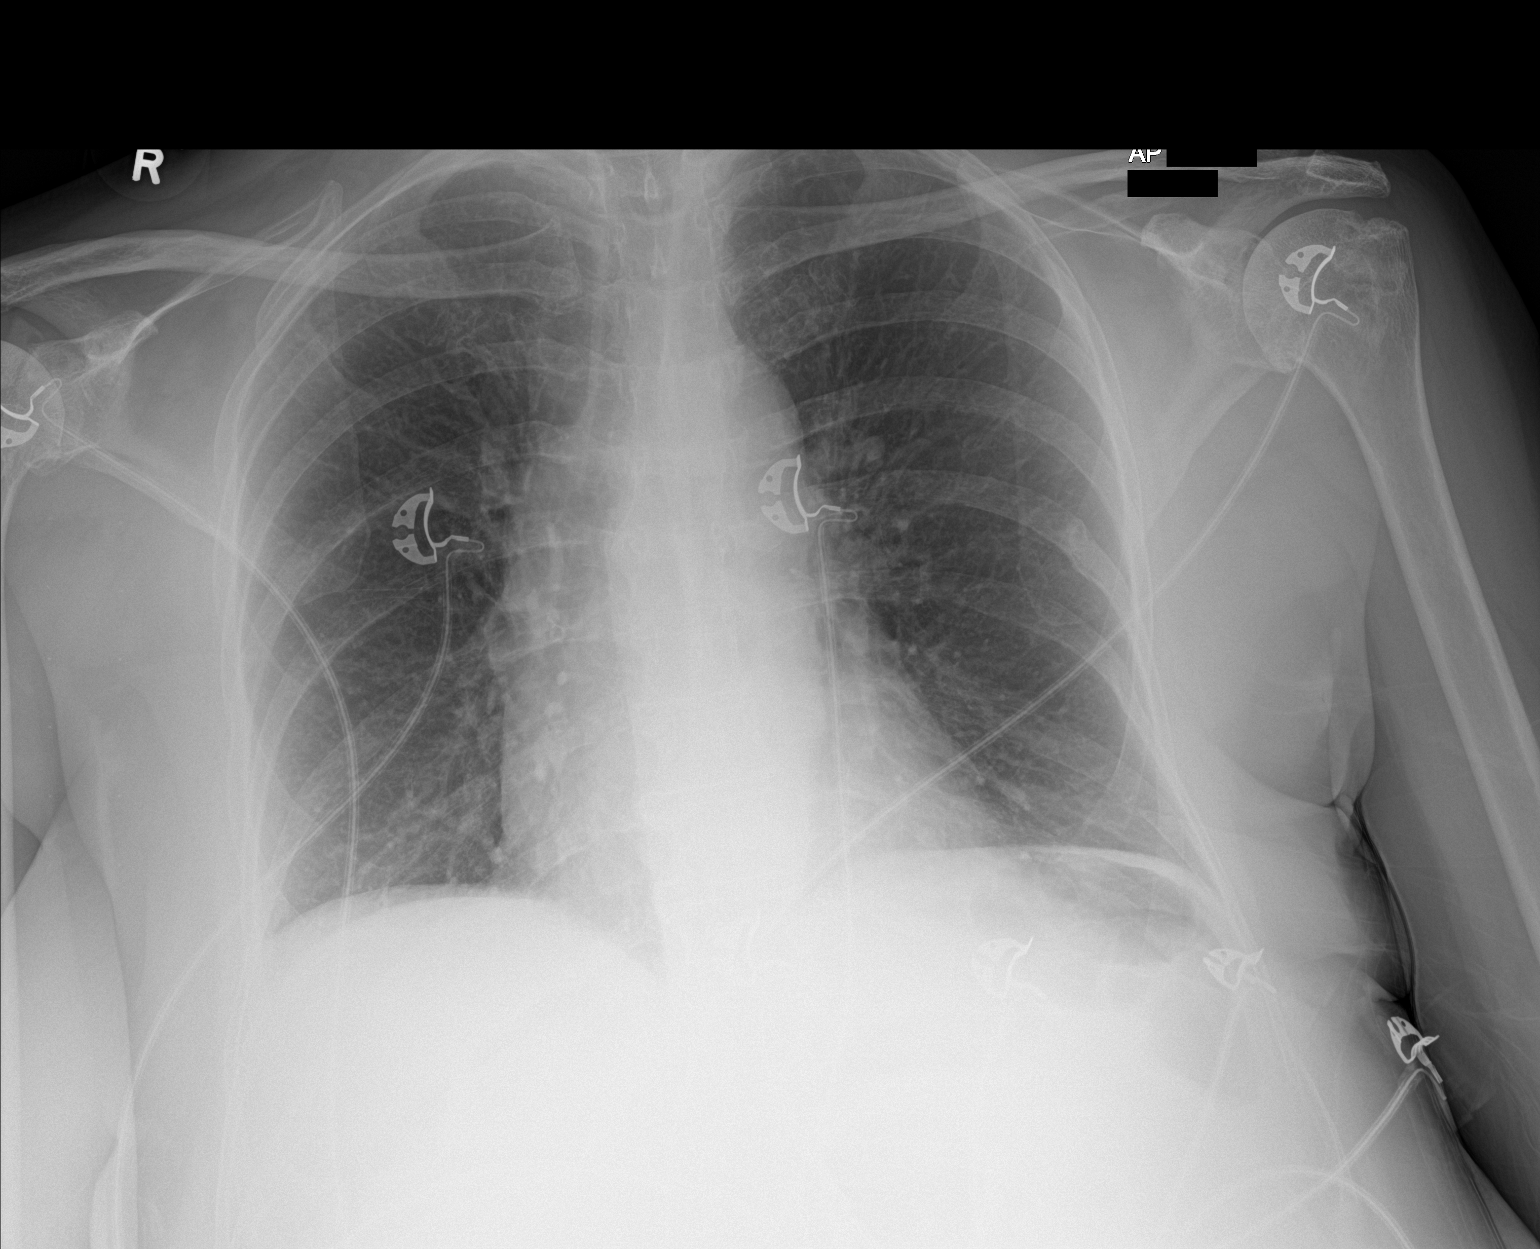

[1 of 1 positions shown; findings below may reference images not displayed]

FINDINGS: Stable cardiac silhouette within normal limits given projection and
technique. Chronic left-sided rib fractures. No focal consolidation.
No pleural effusion. No acute osseous abnormality.
IMPRESSION: No active disease.

By: Emmanuel Addo Tilly M.D.

## 2017-05-25 ENCOUNTER — Ambulatory Visit: Payer: Medicare Other | Admitting: Oncology

## 2017-05-31 NOTE — Progress Notes (Deleted)
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  INTERVAL HISTORY: Patient returns to clinic today for routine 6 month evaluation. She continues to tolerate letrozole well without significant side effects. She has no neurologic complaints. She denies any recent fevers or illnesses. She has a good appetite and denies weight loss. She denies any nausea, vomiting, constipation, or diarrhea. She has no chest pain or shortness of breath. She has no urinary complaints. Patient offers no specific complaints today.  REVIEW OF SYSTEMS:   Review of Systems  Constitutional: Negative for fever, malaise/fatigue and weight loss.  Respiratory: Negative.  Negative for cough and shortness of breath.   Cardiovascular: Negative.  Negative for chest pain and leg swelling.  Gastrointestinal: Negative.  Negative for abdominal pain.  Musculoskeletal: Negative.   Neurological: Negative.  Negative for weakness.  Psychiatric/Behavioral: Negative.  The patient is not nervous/anxious.     As per HPI. Otherwise, a complete review of systems is negative.  PAST MEDICAL HISTORY: Past Medical History:  Diagnosis Date  . Anxiety   . Cancer ([place])   . Depression   . Diabetes mellitus without complication ([place])   . DVT (deep vein thrombosis) in pregnancy ([place])   . Hyperlipidemia   . Hypertension   . Insomnia   . Osteoarthritis   . Rectal bleeding   . Stroke ([place])   . Suicidal ideation     PAST SURGICAL HISTORY: Past Surgical History:  Procedure Laterality Date  . AXILLARY LYMPH NODE DISSECTION Left 12/08/2015   Procedure: AXILLARY LYMPH NODE DISSECTION;  Surgeon: Leonie Green, MD;   Location: [facility] ORS;  Service: General;  Laterality: Left;  POSSIBLE AXILLARY NODE DISSECTION  . BREAST BIOPSY Left 10/27/2015   path pending  . COLONOSCOPY    . MASTECTOMY Left 12/08/2015  . PARTIAL MASTECTOMY WITH AXILLARY SENTINEL LYMPH NODE BIOPSY Left 12/08/2015   Procedure: LEFT MASTECTOMY WITH AXILLARY SENTINEL LYMPH NODE BIOPSY;  Surgeon: Leonie Green, MD;  Location: [facility] ORS;  Service: General;  Laterality: Left;  Marland Kitchen VEIN BYPASS SURGERY      FAMILY HISTORY Family History  Problem Relation Age of Onset  . Pancreatic cancer Father 71  . Colon cancer Paternal Uncle   . Pancreatic cancer Unknown   . Heart failure Mother   . Breast cancer Mother        ADVANCED DIRECTIVES:    HEALTH MAINTENANCE: Social History  Substance Use Topics  . Smoking status: Never Smoker  . Smokeless tobacco: Never Used  . Alcohol use No     Colonoscopy:  PAP:  Bone density:  Lipid panel:  Allergies  Allergen Reactions  . Hydrocodone-Acetaminophen Other (See Comments)    Increased dream activity  . Meperidine Other (See Comments)    Rapid heart rate  . Penicillins Rash    Penicillins group    Current Outpatient Prescriptions  Medication Sig Dispense Refill  . ALPRAZolam (XANAX) 0.5 MG tablet Take 1 tablet by mouth 4 (four) times daily as needed.    . etodolac (LODINE) 500 MG tablet Take 1 tablet by mouth 2 (two) times daily.    . fluticasone (FLONASE) 50 MCG/ACT nasal spray Place 2 sprays into the nose at bedtime.    Marland Kitchen  glimepiride (AMARYL) 2 MG tablet TAKE 1 TABLET (2 MG TOTAL) BY MOUTH DAILY WITH BREAKFAST.  11  . imipramine (TOFRANIL) 50 MG tablet Take 2 tablets by mouth at bedtime.    Marland Kitchen letrozole (FEMARA) 2.5 MG tablet Take 1 tablet (2.5 mg total) by mouth daily. 30 tablet 3  . lisinopril (PRINIVIL,ZESTRIL) 20 MG tablet Take 1 tablet by mouth daily.    . metFORMIN (GLUCOPHAGE) 1000 MG tablet Take 500 mg by mouth 2 (two) times daily.    . ondansetron (ZOFRAN ODT) 4 MG  disintegrating tablet Take 1 tablet (4 mg total) by mouth every 8 (eight) hours as needed for nausea or vomiting. 20 tablet 0  . PARoxetine (PAXIL) 40 MG tablet Take 1 tablet by mouth daily.    . QUEtiapine (SEROQUEL) 300 MG tablet Take 1 tablet by mouth at bedtime.    . simvastatin (ZOCOR) 40 MG tablet Take 1 tablet by mouth at bedtime.    . temazepam (RESTORIL) 15 MG capsule Take 3 capsules by mouth at bedtime.     No current facility-administered medications for this visit.     OBJECTIVE: There were no vitals filed for this visit.   There is no height or weight on file to calculate BMI.    ECOG FS:0 - Asymptomatic  General: Well-developed, well-nourished, no acute distress. Eyes: Pink conjunctiva, anicteric sclera. Breasts: Left chest wall and axilla without evidence of recurrence. Right breast and axilla without lumps or masses. Lungs: Clear to auscultation bilaterally. Heart: Regular rate and rhythm. No rubs, murmurs, or gallops. Abdomen: Soft, nontender, nondistended. No organomegaly noted, normoactive bowel sounds. Musculoskeletal: No edema, cyanosis, or clubbing. Neuro: Alert, answering all questions appropriately. Cranial nerves grossly intact. Skin: No rashes or petechiae noted. Psych: Normal affect.   LAB RESULTS:  Lab Results  Component Value Date   NA 128 (L) 11/29/2016   K 3.6 11/29/2016   CL 94 (L) 11/29/2016   CO2 21 (L) 11/29/2016   GLUCOSE 426 (H) 11/29/2016   BUN 13 11/29/2016   CREATININE 1.07 (H) 11/29/2016   CALCIUM 9.5 11/29/2016   PROT 7.9 11/29/2016   ALBUMIN 4.0 11/29/2016   AST 39 11/29/2016   ALT 29 11/29/2016   ALKPHOS 85 11/29/2016   BILITOT 0.7 11/29/2016   GFRNONAA 51 (L) 11/29/2016   GFRAA 59 (L) 11/29/2016    Lab Results  Component Value Date   WBC 9.0 11/29/2016   NEUTROABS 5.2 11/30/2015   HGB 15.9 11/29/2016   HCT 46.5 11/29/2016   MCV 89.1 11/29/2016   PLT 211 11/29/2016     STUDIES: No results found.  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 Oncotype DX score was low risk at 17.  PLAN:    1. 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 She also elected for a simple mastectomy, therefore she did not require adjuvant XRT. Continue letrozole for 5 years completing in March 2022. Patient's most recent mammogram on November 02, 2016 was reported as BI-RADS 1, repeat in January 2019. Return to clinic in 6 months for further evaluation.  2. Hypertension: Blood pressure is within normal limits today. Continue current medications as prescribed. 3. Postmenopausal: Patient will require a DEXA scan in the near future.  Patient expressed understanding and was in agreement with this plan. She also understands that She can call clinic at any time with any questions, concerns, or complaints.   Breast cancer [facility])   Staging form: Breast, AJCC 7th Edition     Pathologic stage from 11/13/2015: Stage IA (T1c, N0, M0) - Signed by Lloyd Huger, MD on 11/13/2015   Lloyd Huger, MD   05/31/2017 9:30 PM

## 2017-06-01 ENCOUNTER — Inpatient Hospital Stay: Payer: Medicare Other | Admitting: Oncology

## 2017-11-04 ENCOUNTER — Other Ambulatory Visit: Payer: Self-pay

## 2017-11-04 ENCOUNTER — Emergency Department: Payer: Medicare Other

## 2017-11-04 ENCOUNTER — Encounter: Payer: Self-pay | Admitting: Emergency Medicine

## 2017-11-04 ENCOUNTER — Inpatient Hospital Stay
Admission: EM | Admit: 2017-11-04 | Discharge: 2017-11-13 | DRG: 097 | Disposition: A | Discharge status: Skilled Nursing Facility | Payer: Medicare Other | Attending: Internal Medicine | Admitting: Internal Medicine

## 2017-11-04 ENCOUNTER — Inpatient Hospital Stay: Payer: Medicare Other

## 2017-11-04 DIAGNOSIS — G9340 Encephalopathy, unspecified: Secondary | ICD-10-CM | POA: Diagnosis present

## 2017-11-04 DIAGNOSIS — T402X5A Adverse effect of other opioids, initial encounter: Secondary | ICD-10-CM | POA: Diagnosis present

## 2017-11-04 DIAGNOSIS — Z8673 Personal history of transient ischemic attack (TIA), and cerebral infarction without residual deficits: Secondary | ICD-10-CM

## 2017-11-04 DIAGNOSIS — Z79899 Other long term (current) drug therapy: Secondary | ICD-10-CM | POA: Diagnosis not present

## 2017-11-04 DIAGNOSIS — Z23 Encounter for immunization: Secondary | ICD-10-CM

## 2017-11-04 DIAGNOSIS — E785 Hyperlipidemia, unspecified: Secondary | ICD-10-CM | POA: Diagnosis present

## 2017-11-04 DIAGNOSIS — Z880 Allergy status to penicillin: Secondary | ICD-10-CM

## 2017-11-04 DIAGNOSIS — I10 Essential (primary) hypertension: Secondary | ICD-10-CM | POA: Diagnosis present

## 2017-11-04 DIAGNOSIS — Z885 Allergy status to narcotic agent status: Secondary | ICD-10-CM | POA: Diagnosis not present

## 2017-11-04 DIAGNOSIS — F329 Major depressive disorder, single episode, unspecified: Secondary | ICD-10-CM | POA: Diagnosis present

## 2017-11-04 DIAGNOSIS — E1101 Type 2 diabetes mellitus with hyperosmolarity with coma: Secondary | ICD-10-CM

## 2017-11-04 DIAGNOSIS — D72829 Elevated white blood cell count, unspecified: Secondary | ICD-10-CM | POA: Diagnosis present

## 2017-11-04 DIAGNOSIS — A419 Sepsis, unspecified organism: Secondary | ICD-10-CM | POA: Diagnosis present

## 2017-11-04 DIAGNOSIS — Z7951 Long term (current) use of inhaled steroids: Secondary | ICD-10-CM

## 2017-11-04 DIAGNOSIS — G9349 Other encephalopathy: Secondary | ICD-10-CM | POA: Diagnosis present

## 2017-11-04 DIAGNOSIS — E1165 Type 2 diabetes mellitus with hyperglycemia: Secondary | ICD-10-CM | POA: Diagnosis not present

## 2017-11-04 DIAGNOSIS — E872 Acidosis: Secondary | ICD-10-CM | POA: Diagnosis present

## 2017-11-04 DIAGNOSIS — R571 Hypovolemic shock: Secondary | ICD-10-CM | POA: Diagnosis present

## 2017-11-04 DIAGNOSIS — F411 Generalized anxiety disorder: Secondary | ICD-10-CM | POA: Diagnosis present

## 2017-11-04 DIAGNOSIS — Z7984 Long term (current) use of oral hypoglycemic drugs: Secondary | ICD-10-CM

## 2017-11-04 DIAGNOSIS — D696 Thrombocytopenia, unspecified: Secondary | ICD-10-CM | POA: Diagnosis present

## 2017-11-04 DIAGNOSIS — R569 Unspecified convulsions: Secondary | ICD-10-CM | POA: Diagnosis present

## 2017-11-04 DIAGNOSIS — R000 Tachycardia, unspecified: Secondary | ICD-10-CM | POA: Diagnosis present

## 2017-11-04 DIAGNOSIS — J9601 Acute respiratory failure with hypoxia: Secondary | ICD-10-CM | POA: Diagnosis present

## 2017-11-04 DIAGNOSIS — R4182 Altered mental status, unspecified: Secondary | ICD-10-CM

## 2017-11-04 DIAGNOSIS — E119 Type 2 diabetes mellitus without complications: Secondary | ICD-10-CM

## 2017-11-04 DIAGNOSIS — G039 Meningitis, unspecified: Secondary | ICD-10-CM

## 2017-11-04 DIAGNOSIS — A858 Other specified viral encephalitis: Principal | ICD-10-CM | POA: Diagnosis present

## 2017-11-04 DIAGNOSIS — C50212 Malignant neoplasm of upper-inner quadrant of left female breast: Secondary | ICD-10-CM | POA: Diagnosis present

## 2017-11-04 DIAGNOSIS — N179 Acute kidney failure, unspecified: Secondary | ICD-10-CM | POA: Diagnosis present

## 2017-11-04 DIAGNOSIS — Z9012 Acquired absence of left breast and nipple: Secondary | ICD-10-CM | POA: Diagnosis not present

## 2017-11-04 DIAGNOSIS — Z803 Family history of malignant neoplasm of breast: Secondary | ICD-10-CM | POA: Diagnosis not present

## 2017-11-04 DIAGNOSIS — E876 Hypokalemia: Secondary | ICD-10-CM | POA: Diagnosis present

## 2017-11-04 DIAGNOSIS — Z452 Encounter for adjustment and management of vascular access device: Secondary | ICD-10-CM

## 2017-11-04 DIAGNOSIS — J9600 Acute respiratory failure, unspecified whether with hypoxia or hypercapnia: Secondary | ICD-10-CM | POA: Diagnosis not present

## 2017-11-04 DIAGNOSIS — J9690 Respiratory failure, unspecified, unspecified whether with hypoxia or hypercapnia: Secondary | ICD-10-CM

## 2017-11-04 LAB — BLOOD GAS, ARTERIAL
ACID-BASE DEFICIT: 7 mmol/L — AB (ref 0.0–2.0)
BICARBONATE: 16.9 mmol/L — AB (ref 20.0–28.0)
FIO2: 0.4
MECHVT: 450 mL
Mechanical Rate: 12
O2 Saturation: 96.1 %
PEEP: 5 cmH2O
Patient temperature: 37
pCO2 arterial: 30 mmHg — ABNORMAL LOW (ref 32.0–48.0)
pH, Arterial: 7.36 (ref 7.350–7.450)
pO2, Arterial: 86 mmHg (ref 83.0–108.0)

## 2017-11-04 LAB — CBC WITH DIFFERENTIAL/PLATELET
BASOS PCT: 0 %
Basophils Absolute: 0 10*3/uL (ref 0–0.1)
EOS ABS: 0 10*3/uL (ref 0–0.7)
Eosinophils Relative: 0 %
HEMATOCRIT: 46.9 % (ref 35.0–47.0)
HEMOGLOBIN: 15.6 g/dL (ref 12.0–16.0)
LYMPHS ABS: 1.2 10*3/uL (ref 1.0–3.6)
Lymphocytes Relative: 8 %
MCH: 30 pg (ref 26.0–34.0)
MCHC: 33.3 g/dL (ref 32.0–36.0)
MCV: 89.9 fL (ref 80.0–100.0)
MONO ABS: 0.8 10*3/uL (ref 0.2–0.9)
MONOS PCT: 5 %
NEUTROS PCT: 87 %
Neutro Abs: 14.4 10*3/uL — ABNORMAL HIGH (ref 1.4–6.5)
Platelets: 235 10*3/uL (ref 150–440)
RBC: 5.22 MIL/uL — ABNORMAL HIGH (ref 3.80–5.20)
RDW: 13.2 % (ref 11.5–14.5)
WBC: 16.5 10*3/uL — ABNORMAL HIGH (ref 3.6–11.0)

## 2017-11-04 LAB — GLUCOSE, CAPILLARY
GLUCOSE-CAPILLARY: 560 mg/dL — AB (ref 65–99)
GLUCOSE-CAPILLARY: 439 mg/dL — AB (ref 65–99)

## 2017-11-04 LAB — URINE DRUG SCREEN, QUALITATIVE (ARMC ONLY)
AMPHETAMINES, UR SCREEN: NOT DETECTED
Barbiturates, Ur Screen: NOT DETECTED
Benzodiazepine, Ur Scrn: POSITIVE — AB
Cannabinoid 50 Ng, Ur ~~LOC~~: NOT DETECTED
Cocaine Metabolite,Ur ~~LOC~~: NOT DETECTED
MDMA (ECSTASY) UR SCREEN: NOT DETECTED
Methadone Scn, Ur: NOT DETECTED
OPIATE, UR SCREEN: NOT DETECTED
PHENCYCLIDINE (PCP) UR S: NOT DETECTED
Tricyclic, Ur Screen: POSITIVE — AB

## 2017-11-04 LAB — URINALYSIS, COMPLETE (UACMP) WITH MICROSCOPIC
Bilirubin Urine: NEGATIVE
Glucose, UA: >=500 mg/dL — AB
KETONES UR: 20 mg/dL — AB
LEUKOCYTES UA: NEGATIVE
Nitrite: NEGATIVE
PROTEIN: 30 mg/dL — AB
Specific Gravity, Urine: 1.023 (ref 1.005–1.030)
Squamous Epithelial / LPF: NONE SEEN
pH: 6 (ref 5.0–8.0)

## 2017-11-04 LAB — TROPONIN I: Troponin I: <0.03 ng/mL (ref <0.03)

## 2017-11-04 LAB — COMPREHENSIVE METABOLIC PANEL
ALBUMIN: 3.8 g/dL (ref 3.5–5.0)
ALK PHOS: 113 U/L (ref 38–126)
ALT: 27 U/L (ref 14–54)
ANION GAP: 14 (ref 5–15)
AST: 38 U/L (ref 15–41)
BUN: 12 mg/dL (ref 6–20)
CALCIUM: 9.1 mg/dL (ref 8.9–10.3)
CO2: 23 mmol/L (ref 22–32)
Chloride: 97 mmol/L — ABNORMAL LOW (ref 101–111)
Creatinine, Ser: 1.04 mg/dL — ABNORMAL HIGH (ref 0.44–1.00)
GFR calc non Af Amer: 52 mL/min — ABNORMAL LOW (ref >60)
Glucose, Bld: 571 mg/dL (ref 65–99)
POTASSIUM: 3.5 mmol/L (ref 3.5–5.1)
Sodium: 134 mmol/L — ABNORMAL LOW (ref 135–145)
TOTAL PROTEIN: 7.7 g/dL (ref 6.5–8.1)
Total Bilirubin: 1.1 mg/dL (ref 0.3–1.2)

## 2017-11-04 LAB — AMMONIA: Ammonia: 14 umol/L (ref 9–35)

## 2017-11-04 LAB — LACTIC ACID, PLASMA
LACTIC ACID, VENOUS: 2.6 mmol/L — AB (ref 0.5–1.9)
Lactic Acid, Venous: 3.5 mmol/L (ref 0.5–1.9)

## 2017-11-04 LAB — PROTIME-INR
INR: 1.01
Prothrombin Time: 13.2 seconds (ref 11.4–15.2)

## 2017-11-04 LAB — INFLUENZA PANEL BY PCR (TYPE A & B)
INFLBPCR: NEGATIVE
Influenza A By PCR: NEGATIVE

## 2017-11-04 MED ORDER — ZIPRASIDONE MESYLATE 20 MG IM SOLR
INTRAMUSCULAR | Status: AC
  Administered 2017-11-04: 20 mg via INTRAMUSCULAR
  Filled 2017-11-04: qty 20

## 2017-11-04 MED ORDER — HALOPERIDOL LACTATE 5 MG/ML IJ SOLN
INTRAMUSCULAR | Status: AC
  Administered 2017-11-04: 5 mg via INTRAVENOUS
  Filled 2017-11-04: qty 1

## 2017-11-04 MED ORDER — PROPOFOL 1000 MG/100ML IV EMUL
INTRAVENOUS | Status: AC
  Filled 2017-11-04: qty 100

## 2017-11-04 MED ORDER — LORAZEPAM 2 MG/ML IJ SOLN
INTRAMUSCULAR | Status: AC
  Administered 2017-11-04: 2 mg via INTRAVENOUS
  Filled 2017-11-04: qty 1

## 2017-11-04 MED ORDER — VANCOMYCIN HCL 10 G IV SOLR
1250.0000 mg | INTRAVENOUS | Status: DC
  Administered 2017-11-05 (×2): 1250 mg via INTRAVENOUS
  Filled 2017-11-04 (×5): qty 1250

## 2017-11-04 MED ORDER — ROCURONIUM BROMIDE 50 MG/5ML IV SOLN
78.0000 mg | Freq: Once | INTRAVENOUS | Status: AC
  Administered 2017-11-04: 78 mg via INTRAVENOUS
  Filled 2017-11-04: qty 7.8

## 2017-11-04 MED ORDER — ZIPRASIDONE MESYLATE 20 MG IM SOLR
20.0000 mg | Freq: Once | INTRAMUSCULAR | Status: AC
  Administered 2017-11-04: 20 mg via INTRAMUSCULAR

## 2017-11-04 MED ORDER — LORAZEPAM 2 MG/ML IJ SOLN
2.0000 mg | Freq: Once | INTRAMUSCULAR | Status: AC
  Administered 2017-11-04: 2 mg via INTRAVENOUS

## 2017-11-04 MED ORDER — SODIUM CHLORIDE 0.9 % IV SOLN
1500.0000 mg | Freq: Once | INTRAVENOUS | Status: AC
  Administered 2017-11-05: 1500 mg via INTRAVENOUS
  Filled 2017-11-04: qty 15

## 2017-11-04 MED ORDER — SODIUM CHLORIDE 0.9 % IV BOLUS (SEPSIS)
1000.0000 mL | Freq: Once | INTRAVENOUS | Status: AC
  Administered 2017-11-04: 1000 mL via INTRAVENOUS

## 2017-11-04 MED ORDER — PROPOFOL 10 MG/ML IV BOLUS
INTRAVENOUS | Status: AC | PRN
  Administered 2017-11-04: 40 mg via INTRAVENOUS

## 2017-11-04 MED ORDER — VANCOMYCIN HCL IN DEXTROSE 1-5 GM/200ML-% IV SOLN
1000.0000 mg | Freq: Once | INTRAVENOUS | Status: AC
  Administered 2017-11-04: 1000 mg via INTRAVENOUS
  Filled 2017-11-04: qty 200

## 2017-11-04 MED ORDER — SODIUM CHLORIDE 0.9 % IV SOLN
1000.0000 mg | Freq: Two times a day (BID) | INTRAVENOUS | Status: DC
  Administered 2017-11-05 – 2017-11-08 (×6): 1000 mg via INTRAVENOUS
  Filled 2017-11-04 (×8): qty 10

## 2017-11-04 MED ORDER — PROPOFOL 1000 MG/100ML IV EMUL
INTRAVENOUS | Status: AC | PRN
  Administered 2017-11-04: 80 ug/kg/min via INTRAVENOUS

## 2017-11-04 MED ORDER — SODIUM CHLORIDE 0.9 % IV SOLN: 0.0000 ug/h | INTRAVENOUS | Status: DC

## 2017-11-04 MED ORDER — VECURONIUM BROMIDE 10 MG IV SOLR: 8.0000 mg | INTRAVENOUS | Status: AC

## 2017-11-04 MED ORDER — HALOPERIDOL LACTATE 5 MG/ML IJ SOLN
5.0000 mg | Freq: Once | INTRAMUSCULAR | Status: AC
  Administered 2017-11-04: 5 mg via INTRAVENOUS

## 2017-11-04 MED ORDER — DEXAMETHASONE SODIUM PHOSPHATE 10 MG/ML IJ SOLN
10.0000 mg | Freq: Once | INTRAMUSCULAR | Status: AC
  Administered 2017-11-04: 10 mg via INTRAVENOUS
  Filled 2017-11-04: qty 1

## 2017-11-04 MED ORDER — FENTANYL 2500MCG IN NS 250ML (10MCG/ML) PREMIX INFUSION
0.0000 ug/h | INTRAVENOUS | Status: DC
  Filled 2017-11-04: qty 250

## 2017-11-04 MED ORDER — SODIUM CHLORIDE 0.9 % IV SOLN
2.0000 g | Freq: Three times a day (TID) | INTRAVENOUS | Status: DC
  Filled 2017-11-04 (×2): qty 2

## 2017-11-04 MED ORDER — CEFTRIAXONE SODIUM 2 G IJ SOLR
2.0000 g | Freq: Once | INTRAMUSCULAR | Status: AC
  Administered 2017-11-04: 2 g via INTRAVENOUS
  Filled 2017-11-04: qty 2

## 2017-11-04 MED ORDER — ETOMIDATE 2 MG/ML IV SOLN
20.0000 mg | Freq: Once | INTRAVENOUS | Status: AC
  Administered 2017-11-04: 20 mg via INTRAVENOUS

## 2017-11-04 MED ORDER — LIDOCAINE HCL (PF) 1 % IJ SOLN
INTRAMUSCULAR | Status: AC
  Administered 2017-11-04: 20:00:00
  Filled 2017-11-04: qty 5

## 2017-11-04 NOTE — ED Notes (Signed)
Pt on 2 Liters O2 via [place]

## 2017-11-04 NOTE — ED Triage Notes (Signed)
Pt to ED via [facility] from home for altered mental status. Pt husband reports that pt went and took a shower and then when she got out she asked for a ginger ale, when he returned from getting drink patient was altered and combative.  Pt arrives to ED disoriented x 4, combative and agitated. Pt was given a total of Versed 4 mg and Haldol 10 mg. Pt CBG was 4 73, pt was given 500 mL NS and then rechecked 512, pt was given another 500 mL of NS. Dr. Alfred Levins at bedside.   Pt is alert but extremely combative verbal orders given for Ativan 2 mg IVP and Haldol 5 mg.

## 2017-11-04 NOTE — ED Notes (Signed)
Pt beginning to calm down, still trying to remove mittens. Pt given warm blankets and lights turned down for comfort.

## 2017-11-04 NOTE — ED Notes (Signed)
Pt combative and swinging at staff trying to hit staff. Patient trying to pull IV out. Pt placed in soft mittens to protect IV.

## 2017-11-04 NOTE — ED Notes (Signed)
Lab called for blood cultures due to pt difficult stick. Awaiting MRI tech.

## 2017-11-04 NOTE — ED Notes (Signed)
Call placed to CCU RN to give report. CCU RN states she will call back in a few minutes.

## 2017-11-04 NOTE — ED Notes (Signed)
Consent obtained via computer and signed by MD and husband

## 2017-11-04 NOTE — ED Notes (Signed)
Pt still combative not responding to medication. Pt kicking and swinging at staff. Verbal order for Geodon 20 mg.

## 2017-11-04 NOTE — ED Notes (Signed)
Pt remains restless. Propofol at max dose will increase fentanyl.

## 2017-11-04 NOTE — ED Notes (Signed)
Pt husband reports that patient has been vomiting

## 2017-11-04 NOTE — ED Notes (Signed)
Pt reports to this RN that while at home before EMS was called that pt "went out, eyes rolled in the back of her head and patient had her tongue between her teeth".   Pt husband denies hx/o seizures or any seizure like activity.

## 2017-11-04 NOTE — ED Notes (Signed)
Patient still combative and agitated, trying to hit staff.

## 2017-11-04 NOTE — ED Provider Notes (Signed)
[facility] Emergency Department Provider Note  ____________________________________________  Time seen: Approximately 6:32 PM  I have reviewed the triage vital signs and the nursing notes.   HISTORY  Chief Complaint Altered Mental Status  Level 5 caveat:  Portions of the history and physical were unable to be obtained due to AMS   HPI Yvette Tyler is a 72 y.o. female with a history of breast cancer, DM2, HTN, HLD, CVAwho presents for evaluation of altered mental status. According to the patient's husband patient took fluconazole 2 days ago for an yeast infection. She has been having 2 days of nausea and several episodes of  NBNB emesis. Today she took zofran and went in the shower, when she came out, she started to vomit again. Huband assisted patient to bed and went to grab her ginger ale. When he came back and she was unresponsive and had her eyes rolled up and her jaw clenched. She then started to speak nonsensically, became very agitated and combative which prompted him to call EMS. Per EMS, patient was incoherent, combative, agitated requiring multiple rounds of sedation for a total of 4 mg of versed said and 10 of Haldol with no changes in her mental status. Patient arrives altered, nonsensical words, combative, agitated.   Past Medical History:  Diagnosis Date  . Anxiety   . Cancer ([place])   . Depression   . Diabetes mellitus without complication ([place])   . DVT (deep vein thrombosis) in pregnancy ([place])   . Hyperlipidemia   . Hypertension   . Insomnia   . Osteoarthritis   . Rectal bleeding   . Stroke ([place])   . Suicidal ideation     Patient Active Problem List   Diagnosis Date Noted  . Primary cancer of upper inner quadrant of left female breast ([place]) 11/13/2015    Past Surgical History:  Procedure Laterality Date  . AXILLARY LYMPH NODE DISSECTION Left 12/08/2015   Procedure: AXILLARY LYMPH NODE DISSECTION;  Surgeon: Leonie Green,  MD;  Location: [facility] ORS;  Service: General;  Laterality: Left;  POSSIBLE AXILLARY NODE DISSECTION  . BREAST BIOPSY Left 10/27/2015   path pending  . COLONOSCOPY    . MASTECTOMY Left 12/08/2015  . PARTIAL MASTECTOMY WITH AXILLARY SENTINEL LYMPH NODE BIOPSY Left 12/08/2015   Procedure: LEFT MASTECTOMY WITH AXILLARY SENTINEL LYMPH NODE BIOPSY;  Surgeon: Leonie Green, MD;  Location: [facility] ORS;  Service: General;  Laterality: Left;  Marland Kitchen VEIN BYPASS SURGERY      Prior to Admission medications   Medication Sig Start Date End Date Taking? Authorizing Provider  ALPRAZolam Duanne Moron) 0.5 MG tablet Take 1 tablet by mouth 4 (four) times daily as needed.    [provider]  etodolac (LODINE) 500 MG tablet Take 1 tablet by mouth 2 (two) times daily. 10/12/15   [provider]  fluticasone (FLONASE) 50 MCG/ACT nasal spray Place 2 sprays into the nose at bedtime. 09/22/15   [provider]  glimepiride (AMARYL) 2 MG tablet TAKE 1 TABLET (2 MG TOTAL) BY MOUTH DAILY WITH BREAKFAST. 11/09/15   [provider]  imipramine (TOFRANIL) 50 MG tablet Take 2 tablets by mouth at bedtime.    [provider]  letrozole (FEMARA) 2.5 MG tablet Take 1 tablet (2.5 mg total) by mouth daily. 12/22/15   Lloyd Huger, MD  lisinopril (PRINIVIL,ZESTRIL) 20 MG tablet Take 1 tablet by mouth daily. 10/08/15 11/23/16  [provider]  metFORMIN (GLUCOPHAGE) 1000 MG tablet Take  500 mg by mouth 2 (two) times daily. 08/11/15   [provider]  ondansetron (ZOFRAN ODT) 4 MG disintegrating tablet Take 1 tablet (4 mg total) by mouth every 8 (eight) hours as needed for nausea or vomiting. 11/30/16   Paulette Blanch, MD  ondansetron (ZOFRAN) 4 MG tablet Take 1 tablet by mouth every 8 (eight) hours as needed. 11/02/17   [provider]  PARoxetine (PAXIL) 40 MG tablet Take 1 tablet by mouth daily.    [provider]  QUEtiapine (SEROQUEL) 300 MG tablet Take 1 tablet by  mouth at bedtime.    [provider]  simvastatin (ZOCOR) 40 MG tablet Take 1 tablet by mouth at bedtime.    [provider]  temazepam (RESTORIL) 15 MG capsule Take 3 capsules by mouth at bedtime.    [provider]  triamcinolone cream (KENALOG) 0.1 % Apply 1 application topically as needed. 10/26/17   [provider]    Allergies Hydrocodone-acetaminophen; Meperidine; and Penicillins  Family History  Problem Relation Age of Onset  . Pancreatic cancer Father 48  . Colon cancer Paternal Uncle   . Pancreatic cancer Unknown   . Heart failure Mother   . Breast cancer Mother     Social History Social History   Tobacco Use  . Smoking status: Never Smoker  . Smokeless tobacco: Never Used  Substance Use Topics  . Alcohol use: No  . Drug use: No    Review of Systems  Constitutional: Negative for fever. + AMS Gastrointestinal: + vomiting  Genitourinary: + yeast infection  Level 5 caveat:  Portions of the history and physical were unable to be obtained due to AMS   ____________________________________________   PHYSICAL EXAM:  VITAL SIGNS: ED Triage Vitals  Enc Vitals Group     BP 11/04/17 1620 (!) 144/79     Pulse Rate 11/04/17 1620 (!) 127     Resp 11/04/17 1709 (!) 26     Temp 11/04/17 1750 (!) 101.1 F (38.4 C)     Temp Source 11/04/17 1750 Rectal     SpO2 11/04/17 1620 97 %     Weight --      Height --      Head Circumference --      Peak Flow --      Pain Score --      Pain Loc --      Pain Edu? --      Excl. in [place]? --     Constitutional: Agitated, combative, not responding to questions, keeps rubbing her head HEENT:      Head: Normocephalic and atraumatic.         Eyes: Conjunctivae are normal. Sclera is non-icteric.       Mouth/Throat: Mucous membranes are dry.       Neck: Supple with no signs of meningismus. Cardiovascular: tachycardic with regular rhythm Respiratory: Normal respiratory effort. Lungs are clear to  auscultation bilaterally. No wheezes, crackles, or rhonchi.  Gastrointestinal: Soft, and non distended with positive bowel sounds.  Musculoskeletal:  No edema, cyanosis, or erythema of extremities. Neurologic: Patient is speaking words that do not exist, moving all extremities, face is symmetric Skin: Skin is warm, dry and intact. No rash noted.   ____________________________________________   LABS (all labs ordered are listed, but only abnormal results are displayed)  Labs Reviewed  CBC WITH DIFFERENTIAL/PLATELET - Abnormal; Notable for the following components:      Result Value   WBC 16.5 (*)  RBC 5.22 (*)    Neutro Abs 14.4 (*)    All other components within normal limits  COMPREHENSIVE METABOLIC PANEL - Abnormal; Notable for the following components:   Sodium 134 (*)    Chloride 97 (*)    Glucose, Bld 571 (*)    Creatinine, Ser 1.04 (*)    GFR calc non Af Amer 52 (*)    All other components within normal limits  LACTIC ACID, PLASMA - Abnormal; Notable for the following components:   Lactic Acid, Venous 3.5 (*)    All other components within normal limits  BLOOD GAS, VENOUS - Abnormal; Notable for the following components:   Acid-base deficit 4.3 (*)    All other components within normal limits  URINE DRUG SCREEN, QUALITATIVE ([facility] ONLY) - Abnormal; Notable for the following components:   Tricyclic, Ur Screen POSITIVE (*)    Benzodiazepine, Ur Scrn POSITIVE (*)    All other components within normal limits  URINALYSIS, COMPLETE (UACMP) WITH MICROSCOPIC - Abnormal; Notable for the following components:   Color, Urine STRAW (*)    APPearance CLEAR (*)    Glucose, UA >=500 (*)    Hgb urine dipstick SMALL (*)    Ketones, ur 20 (*)    Protein, ur 30 (*)    Bacteria, UA RARE (*)    All other components within normal limits  GLUCOSE, CAPILLARY - Abnormal; Notable for the following components:   Glucose-Capillary 560 (*)    All other components within normal limits    RESPIRATORY PANEL BY PCR  TROPONIN I  AMMONIA  PROTIME-INR  INFLUENZA PANEL BY PCR (TYPE A & B)  LACTIC ACID, PLASMA  BLOOD GAS, ARTERIAL  CBG MONITORING, ED   ____________________________________________  EKG  ED ECG REPORT I, Rudene Re, the attending physician, personally viewed and interpreted this ECG.  Sinus tachycardia, rate of 125, normal intervals, right axis deviation, diffuse ST depressions with no ST elevation. these depressions are new when compared to prior from February 2018 ____________________________________________  RADIOLOGY  Head CT: 1. Limited examination due to patient motion despite multiple attempts. 2. Age related cerebral atrophy, ventriculomegaly and periventricular white matter disease. 3. No acute intracranial findings or mass lesions.  CXR: No acute abnormality. Stable mild chronic interstitial lung disease. ____________________________________________   PROCEDURES  Procedure(s) performed:yes Procedure Name: Intubation Date/Time: 11/04/2017 8:06 PM Performed by: Rudene Re, MD Pre-anesthesia Checklist: Patient identified, Emergency Drugs available, Suction available and Patient being monitored Preoxygenation: Pre-oxygenation with 100% oxygen Induction Type: IV induction and Rapid sequence Laryngoscope Size: Glidescope and 3 Tube size: 7.5 mm Number of attempts: 1 Airway Equipment and Method: Video-laryngoscopy Placement Confirmation: ETT inserted through vocal cords under direct vision,  CO2 detector and Breath sounds checked- equal and bilateral Secured at: 22 cm Tube secured with: ETT holder Dental Injury: Teeth and Oropharynx as per pre-operative assessment  Comments: Husband consented    .Lumbar Puncture Date/Time: 11/04/2017 8:08 PM Performed by: Rudene Re, MD Authorized by: Rudene Re, MD   Consent:    Consent obtained:  Written   Consent given by:  Spouse   Risks discussed:  Headache,  bleeding, infection, pain, nerve damage and repeat procedure Pre-procedure details:    Procedure purpose:  Diagnostic   Preparation: Patient was prepped and draped in usual sterile fashion   Anesthesia (see MAR for exact dosages):    Anesthesia method:  Local infiltration   Local anesthetic:  Lidocaine 1% w/o epi Procedure details:    Lumbar space:  L3-L4  interspace   Patient position:  L lateral decubitus   Needle gauge:  22   Needle type:  Spinal needle - Quincke tip   Number of attempts:  3 (unsuccessful) Post-procedure:    Puncture site:  Adhesive bandage applied and direct pressure applied   Patient tolerance of procedure:  Tolerated well, no immediate complications   Critical Care performed: yes  CRITICAL CARE Performed by: Rudene Re  ?  Total critical care time: 60 min  Critical care time was exclusive of separately billable procedures and treating other patients.  Critical care was necessary to treat or prevent imminent or life-threatening deterioration.  Critical care was time spent personally by me on the following activities: development of treatment plan with patient and/or surrogate as well as nursing, discussions with consultants, evaluation of patient's response to treatment, examination of patient, obtaining history from patient or surrogate, ordering and performing treatments and interventions, ordering and review of laboratory studies, ordering and review of radiographic studies, pulse oximetry and re-evaluation of patient's condition.  ____________________________________________   INITIAL IMPRESSION / ASSESSMENT AND PLAN / ED COURSE   72 y.o. female with a history of breast cancer, DM2, HTN, HLD, CVAwho presents for evaluation of acute onset of altered mental status just PTA in the setting of two days of vomiting, recent prescription for fluconazole and zofran. Husband reports witnessing a brief episode of unconsciousness just prior to patient  becoming acutely encephalopathic. patient arrives in the emergency department is speaking nonsensical words, combative, agitated. Patient required several rounds of medications to be able to calm her down for patient's safety and staff safety and also to allow me to get a head CT to rule out intracranial hemorrhage. Patient never fully sedated in spite of the several rounds of medication. Head CT is limited due to motion however no acute findings have been seen. Labs have been sent. Unclear etiology at this time with a broad differential diagnosis including stroke, sepsis, flu, hepatic encephalopathy, uremic encephalopathy, DKA.   Clinical Course as of Nov 04 2008  Sat Nov 04, 2017  1816 Patient now has a fever plus leukocytosis and elevated lactic acid. Although I have a low suspicion she has meningitis since the onset of her symptoms was so sudden, without an alternative etiology for her AMS/fever/leukocytosis, I feel that patient warrants treatment for possible meningitis. If flu swab is negative, patient will have to be paralyzed and intubated as to allow for LP to be performed safely since she continues to be agitated and combative. [place] is negative for UTI. CXR with no evidence of PNA.  [CV]    Clinical Course User Index [CV] Alfred Levins [place], MD   _________________________ 8:08 PM on 11/04/2017 -----------------------------------------  flu negative. Discussed lumbar puncture with husband and the need for intubation and paralysis since patient is unable to cooperate for the procedure. The husband understands the need for this procedure and has consented. Patient was intubated with no complications per procedure note above. Unfortunately lumbar puncture was unsuccessful after 3 attempts. I was able to get into the spinal canal and had a drop of CSF onto my needle but no more fluid was able to be obtained. Patient has already been given rocephin and decadron. Will admit to the Hospitalist  As  part of my medical decision making, I reviewed the following data within the [place] History obtained from family, Nursing notes reviewed and incorporated, Labs reviewed , EKG interpreted , Radiograph reviewed , Discussed with admitting physician ,  Notes from prior ED visits and [place] Controlled Substance Database    Pertinent labs & imaging results that were available during my care of the patient were reviewed by me and considered in my medical decision making (see chart for details).    ____________________________________________   FINAL CLINICAL IMPRESSION(S) / ED DIAGNOSES  Final diagnoses:  Encephalopathy acute  Sepsis, due to unspecified organism [facility])      NEW MEDICATIONS STARTED DURING THIS VISIT:  ED Discharge Orders    None       Note:  This document was prepared using Dragon voice recognition software and may include unintentional dictation errors.    Alfred Levins, [place], MD 11/04/17 2010

## 2017-11-04 NOTE — ED Notes (Signed)
MRI tech informed of STAT MRI for pt by CCU per Elmo Putt, RN in CCU.

## 2017-11-04 NOTE — ED Notes (Signed)
Pt being transported to CT

## 2017-11-04 NOTE — ED Notes (Signed)
MRI tech called and is here. Will call back after he speaks with the patients husband to answer screening questions.

## 2017-11-04 NOTE — ED Notes (Signed)
Theadora Rama, Agricultural consultant informed of need for Air cabin crew for patient

## 2017-11-04 NOTE — Consult Note (Signed)
Pharmacy Antibiotic Note  Yvette Tyler is a 72 y.o. female admitted on 11/04/2017 with acute encephalopathy and sepsis secondary to suspected meningitis. Pharmacy has been consulted for  Meropenem and vancomycin dosing. Patient has allergy to PCN (rash)- and can not be given ampicillin.   Plan: Ke: 0.047   T1/2: 14.74   Vd: 54.6  Start vancomycin 1250mg  IV every 18 hours with 6 hour stack dosing. Trough level ordered prior to 3rd dose. Calculated trough at Css is 19.5. Will monitor renal function and adjust dose as needed.   Start meropenem 2g IV every 8 hours.    Height: 5\' 7"  (170.2 cm) Weight: 171 lb 15.3 oz (78 kg) IBW/kg (Calculated) : 61.6  Temp (24hrs), Avg:101.2 F (38.4 C), Min:100.8 F (38.2 C), Max:101.5 F (38.6 C)  Recent Labs  Lab 11/04/17 1700 11/04/17 2004  WBC 16.5*  --   CREATININE 1.04*  --   LATICACIDVEN 3.5* 2.6*    Estimated Creatinine Clearance: 51.9 mL/min (A) (by C-G formula based on SCr of 1.04 mg/dL (H)).    Allergies  Allergen Reactions  . Hydrocodone-Acetaminophen Other (See Comments)    Increased dream activity  . Meperidine Other (See Comments)    Rapid heart rate  . Penicillins Rash    Has patient had a PCN reaction causing immediate rash, facial/tongue/throat swelling, SOB or lightheadedness with hypotension: Yes Has patient had a PCN reaction causing severe rash involving mucus membranes or skin necrosis: No Has patient had a PCN reaction that required hospitalization: No Has patient had a PCN reaction occurring within the last 10 years: No If all of the above answers are "NO", then may proceed with Cephalosporin use.     Antimicrobials this admission: 1/12 meropenem >>  1/12 vancomycin >>   Dose adjustments this admission:  Microbiology results:  Thank you for allowing pharmacy to be a part of this patient's care.  Pernell Dupre, PharmD, BCPS Clinical Pharmacist 11/04/2017 9:31 PM

## 2017-11-04 NOTE — ED Notes (Signed)
Pt remains restless moving arms and legs.

## 2017-11-04 NOTE — ED Provider Notes (Signed)
Patient remains completely disoriented, intubated on the mechanical ventilator, on propofol and fentanyl. Reaching up and trying to pull at her endotracheal tube. We'll give a dose of vecuronium for now until patient is able to make safe decisions and to facilitate further workup with MRI.   Carrie Mew, MD 11/04/17 2326

## 2017-11-04 NOTE — ED Notes (Signed)
Safety sitting with the patient
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
## 2017-11-04 NOTE — H&P (Addendum)
[place] [facility] [place] NAME: Yvette Tyler    MR#:  [id number]  DATE OF BIRTH:  1946-09-10  DATE OF ADMISSION:  11/04/2017  PRIMARY CARE PHYSICIAN: Glendon Axe, MD   REQUESTING/REFERRING PHYSICIAN: Joni Fears, MD  CHIEF COMPLAINT:   Chief Complaint  Patient presents with  . Altered Mental Status    HISTORY OF PRESENT ILLNESS:  Yvette Tyler  is a 72 y.o. female who presents with altered mental status.  Patient is unable to contribute information to her HPI given that she is intubated and sedated.  Her husband states that she has had some malaise for the past couple of days.  Tonight he helped her to the bathroom and back to her room.  She then proceeded to vomit and then had what sounds like a full on tonic-clonic seizure.  In the ambulance she is started to arouse and then became very agitated and confused.  She required heavy amounts of sedating medications here, eventually requiring intubation.  Patient's husband states that she has been complaining of a headache for the past couple of days.  Strong suspicion for meningitis versus possible atypical stroke.  Patient also meets sepsis criteria.  Hospitalist were called for admission  PAST MEDICAL HISTORY:   Past Medical History:  Diagnosis Date  . Anxiety   . Cancer ([place])   . Depression   . Diabetes mellitus without complication ([place])   . DVT (deep vein thrombosis) in pregnancy ([place])   . Hyperlipidemia   . Hypertension   . Insomnia   . Osteoarthritis   . Rectal bleeding   . Stroke ([place])   . Suicidal ideation     PAST SURGICAL HISTORY:   Past Surgical History:  Procedure Laterality Date  . AXILLARY LYMPH NODE DISSECTION Left 12/08/2015   Procedure: AXILLARY LYMPH NODE DISSECTION;  Surgeon: Leonie Green, MD;  Location: [facility] ORS;  Service: General;  Laterality: Left;  POSSIBLE AXILLARY NODE DISSECTION  . BREAST BIOPSY Left 10/27/2015   path pending  . COLONOSCOPY     . MASTECTOMY Left 12/08/2015  . PARTIAL MASTECTOMY WITH AXILLARY SENTINEL LYMPH NODE BIOPSY Left 12/08/2015   Procedure: LEFT MASTECTOMY WITH AXILLARY SENTINEL LYMPH NODE BIOPSY;  Surgeon: Leonie Green, MD;  Location: [facility] ORS;  Service: General;  Laterality: Left;  Marland Kitchen VEIN BYPASS SURGERY      SOCIAL HISTORY:   Social History   Tobacco Use  . Smoking status: Never Smoker  . Smokeless tobacco: Never Used  Substance Use Topics  . Alcohol use: No    FAMILY HISTORY:   Family History  Problem Relation Age of Onset  . Pancreatic cancer Father 61  . Colon cancer Paternal Uncle   . Pancreatic cancer Unknown   . Heart failure Mother   . Breast cancer Mother     DRUG ALLERGIES:   Allergies  Allergen Reactions  . Hydrocodone-Acetaminophen Other (See Comments)    Increased dream activity  . Meperidine Other (See Comments)    Rapid heart rate  . Penicillins Rash    Has patient had a PCN reaction causing immediate rash, facial/tongue/throat swelling, SOB or lightheadedness with hypotension: Yes Has patient had a PCN reaction causing severe rash involving mucus membranes or skin necrosis: No Has patient had a PCN reaction that required hospitalization: No Has patient had a PCN reaction occurring within the last 10 years: No If all of the above answers are "NO", then may proceed with Cephalosporin use.  MEDICATIONS AT HOME:   Prior to Admission medications   Medication Sig Start Date End Date Taking? Authorizing Provider  ALPRAZolam Duanne Moron) 0.5 MG tablet Take 1 tablet by mouth 4 (four) times daily as needed.    [provider]  etodolac (LODINE) 500 MG tablet Take 1 tablet by mouth 2 (two) times daily. 10/12/15   [provider]  fluticasone (FLONASE) 50 MCG/ACT nasal spray Place 2 sprays into the nose at bedtime. 09/22/15   [provider]  glimepiride (AMARYL) 2 MG tablet TAKE 1 TABLET (2 MG TOTAL) BY MOUTH DAILY WITH BREAKFAST. 11/09/15    [provider]  imipramine (TOFRANIL) 50 MG tablet Take 2 tablets by mouth at bedtime.    [provider]  letrozole (FEMARA) 2.5 MG tablet Take 1 tablet (2.5 mg total) by mouth daily. 12/22/15   Lloyd Huger, MD  lisinopril (PRINIVIL,ZESTRIL) 20 MG tablet Take 1 tablet by mouth daily. 10/08/15 11/23/16  [provider]  metFORMIN (GLUCOPHAGE) 1000 MG tablet Take 500 mg by mouth 2 (two) times daily. 08/11/15   [provider]  ondansetron (ZOFRAN ODT) 4 MG disintegrating tablet Take 1 tablet (4 mg total) by mouth every 8 (eight) hours as needed for nausea or vomiting. 11/30/16   Paulette Blanch, MD  ondansetron (ZOFRAN) 4 MG tablet Take 1 tablet by mouth every 8 (eight) hours as needed. 11/02/17   [provider]  PARoxetine (PAXIL) 40 MG tablet Take 1 tablet by mouth daily.    [provider]  QUEtiapine (SEROQUEL) 300 MG tablet Take 1 tablet by mouth at bedtime.    [provider]  simvastatin (ZOCOR) 40 MG tablet Take 1 tablet by mouth at bedtime.    [provider]  temazepam (RESTORIL) 15 MG capsule Take 3 capsules by mouth at bedtime.    [provider]  triamcinolone cream (KENALOG) 0.1 % Apply 1 application topically as needed. 10/26/17   [provider]    REVIEW OF SYSTEMS:  Review of Systems  Unable to perform ROS: Critical illness     VITAL SIGNS:   Vitals:   11/04/17 2030 11/04/17 2045 11/04/17 2100 11/04/17 2110  BP: (!) 178/96 (!) 191/107 (!) 172/105   Pulse: (!) 117 (!) 122 (!) 117   Resp: 20 (!) 25 (!) 23   Temp: (!) 101.5 F (38.6 C) (!) 101.2 F (38.4 C) (!) 100.8 F (38.2 C)   TempSrc:      SpO2: 100% 100% 100%   Weight:    78 kg (171 lb 15.3 oz)  Height:    5\' 7"  (1.702 m)   Wt Readings from Last 3 Encounters:  11/04/17 78 kg (171 lb 15.3 oz)  11/29/16 78 kg (172 lb)  06/08/16 97 kg (213 lb 12.8 oz)    PHYSICAL EXAMINATION:  Physical Exam  Vitals  reviewed. Constitutional: She appears well-developed and well-nourished.  HENT:  Head: Normocephalic and atraumatic.  Mouth/Throat: Oropharynx is clear and moist.  Eyes: Conjunctivae and EOM are normal. Pupils are equal, round, and reactive to light. No scleral icterus.  Neck: Normal range of motion. Neck supple. No JVD present. No thyromegaly present.  Cardiovascular: Regular rhythm and intact distal pulses. Exam reveals no gallop and no friction rub.  No murmur heard. Tachycardic  Respiratory: Effort normal and breath sounds normal. No respiratory distress. She has no wheezes. She has no rales.  GI: Soft. Bowel sounds are normal. She exhibits no distension. There is no tenderness.  Musculoskeletal: Normal range of motion. She exhibits no edema.  No arthritis, no gout  Lymphadenopathy:    She has no cervical adenopathy.  Neurological: No cranial nerve deficit.  Unable to fully assess due to sedation and her condition.    Skin: Skin is warm and dry. No rash noted. No erythema.  Psychiatric:  Unable to assess due to patient condition    LABORATORY PANEL:   CBC Recent Labs  Lab 11/04/17 1700  WBC 16.5*  HGB 15.6  HCT 46.9  PLT 235   ------------------------------------------------------------------------------------------------------------------  Chemistries  Recent Labs  Lab 11/04/17 1700  NA 134*  K 3.5  CL 97*  CO2 23  GLUCOSE 571*  BUN 12  CREATININE 1.04*  CALCIUM 9.1  AST 38  ALT 27  ALKPHOS 113  BILITOT 1.1   ------------------------------------------------------------------------------------------------------------------  Cardiac Enzymes Recent Labs  Lab 11/04/17 1700  TROPONINI <0.03   ------------------------------------------------------------------------------------------------------------------  RADIOLOGY:  Ct Head Wo Contrast  Result Date: 11/04/2017 CLINICAL DATA:  Altered mental status. EXAM: CT HEAD WITHOUT CONTRAST TECHNIQUE:  Contiguous axial images were obtained from the base of the skull through the vertex without intravenous contrast. COMPARISON:  Head CT 10/12/2012 FINDINGS: Examination is limited by patient motion despite multiple attempts at rescanning. Brain: Stable age related cerebral atrophy, ventriculomegaly and periventricular white matter disease. No extra-axial fluid collections are identified. No CT findings for acute hemispheric infarction or intracranial hemorrhage. No mass lesions. The brainstem and cerebellum are normal. Vascular: No hyperdense vessel or unexpected calcification. Skull: No acute skull fracture or worrisome bone lesions. Sinuses/Orbits: Bilateral maxillary sinus, ethmoid and sphenoid sinus disease with evidence of prior sinonasal surgery. The mastoid air cells and middle ear cavities are clear. The frontal sinuses are clear. Other: No scalp lesions or hematoma. IMPRESSION: 1. Limited examination due to patient motion despite multiple attempts. 2. Age related cerebral atrophy, ventriculomegaly and periventricular white matter disease. 3. No acute intracranial findings or mass lesions. Electronically Signed   By: Marijo Sanes M.D.   On: 11/04/2017 17:09   Dg Chest Portable 1 View  Result Date: 11/04/2017 CLINICAL DATA:  Status post intubation. EXAM: PORTABLE CHEST 1 VIEW COMPARISON:  11/04/2017 FINDINGS: The endotracheal tube is 4.7 cm above the carina. The NG tube is coursing down the esophagus and into the stomach. The cardiac silhouette, mediastinal and hilar contours are stable. There is tortuosity of the thoracic aorta. Mild mediastinal widening and mild fullness in the aorticopulmonary window may be accentuated by the supine position of the patient and the AP projection. No acute pulmonary findings. IMPRESSION: The endotracheal tube and NG tubes are in good position as above. Mild mediastinal widening likely due to the AP projection and supine position of the patient. A followup upright chest  film may be helpful when able. Electronically Signed   By: Marijo Sanes M.D.   On: 11/04/2017 21:03   Dg Chest Portable 1 View  Result Date: 11/04/2017 CLINICAL DATA:  Altered mental status. EXAM: PORTABLE CHEST 1 VIEW COMPARISON:  11/29/2016. FINDINGS: Normal sized heart. Clear lungs. Stable mild prominence of the interstitial markings. Thoracic spine degenerative changes. IMPRESSION: No acute abnormality. Stable mild chronic interstitial lung disease. Electronically Signed   By: Claudie Revering M.D.   On: 11/04/2017 18:17    EKG:   Orders placed or performed during the hospital encounter of 11/04/17  . ED EKG  . ED EKG  . EKG 12-Lead  . EKG 12-Lead    IMPRESSION AND PLAN:  Principal Problem:  Sepsis ([place]) -drawn suspicion for meningitis as her source.  Broad IV antibiotics started, IV Decadron given, cultures ordered.  Lactic acid is elevated, will use IV fluids and trend her lactate until within normal limits.  Blood pressure stable. Active Problems:   Acute encephalopathy -due to meningitis versus possible atypical stroke.  Patient has prior history of stroke.  We will get a stat MRI to try to differentiate the cause of her seizure and encephalopathy.   Seizure -loaded with IV Keppra, seizures likely due to whatever is causing her encephalopathy, see above for further workup.  If MRI does not show any cause, will likely need EEG and neurology consult.   HTN (hypertension) -stable, currently normotensive, can use as needed antihypertensives if her blood pressure rises, continue home meds when she is more alert and taking p.o.   Diabetes ([place]) -sliding scale insulin with corresponding glucose checks   HLD (hyperlipidemia) -home dose anti-lipids when she is more alert and taking p.o.  All the records are reviewed and case discussed with ED provider. Management plans discussed with the patient and/or family.  DVT PROPHYLAXIS: SubQ lovenox  GI PROPHYLAXIS: H2 blocker  ADMISSION STATUS:  Inpatient  CODE STATUS: Full Code Status History    Date Active Date Inactive Code Status Order ID Comments User Context   12/08/2015 17:50 12/09/2015 19:51 Full Code [id number]  Leonie Green, MD Inpatient      TOTAL CRITICAL CARE TIME TAKING CARE OF THIS PATIENT: 50 minutes.   Schmaltz, Rebekka Lobello [place] 11/04/2017, 9:31 PM  [facility]  Office  [phone]  CC: Primary care physician; Glendon Axe, MD  Note:  This document was prepared using Dragon voice recognition software and may include unintentional dictation errors.

## 2017-11-04 NOTE — ED Notes (Signed)
Awaiting MRI tech to arrive.

## 2017-11-04 NOTE — ED Notes (Signed)
Pt in CT, pt combative and agitated, unable to obtain clear CT due to patient moving. Verbal order given for Geodon 20 mg IM

## 2017-11-05 ENCOUNTER — Encounter: Payer: Self-pay | Admitting: *Deleted

## 2017-11-05 ENCOUNTER — Inpatient Hospital Stay: Payer: Medicare Other

## 2017-11-05 DIAGNOSIS — G039 Meningitis, unspecified: Secondary | ICD-10-CM

## 2017-11-05 DIAGNOSIS — R569 Unspecified convulsions: Secondary | ICD-10-CM

## 2017-11-05 DIAGNOSIS — A419 Sepsis, unspecified organism: Secondary | ICD-10-CM

## 2017-11-05 DIAGNOSIS — G9340 Encephalopathy, unspecified: Secondary | ICD-10-CM

## 2017-11-05 DIAGNOSIS — E1165 Type 2 diabetes mellitus with hyperglycemia: Secondary | ICD-10-CM

## 2017-11-05 DIAGNOSIS — E1101 Type 2 diabetes mellitus with hyperosmolarity with coma: Secondary | ICD-10-CM

## 2017-11-05 LAB — BLOOD GAS, ARTERIAL
Acid-base deficit: 12.4 mmol/L — ABNORMAL HIGH (ref 0.0–2.0)
Acid-base deficit: 0.1 mmol/L (ref 0.0–2.0)
BICARBONATE: 15.7 mmol/L — AB (ref 20.0–28.0)
BICARBONATE: 25.9 mmol/L (ref 20.0–28.0)
FIO2: 0.4
FIO2: 0.3
LHR: 12 {breaths}/min
LHR: 18 {breaths}/min
MECHVT: 450 mL
O2 SAT: 94.6 %
O2 SAT: 96.5 %
PATIENT TEMPERATURE: 37
PCO2 ART: 47 mmHg (ref 32.0–48.0)
PEEP: 5 cmH2O
PH ART: 7.17 — AB (ref 7.350–7.450)
PO2 ART: 92 mmHg (ref 83.0–108.0)
Patient temperature: 37
VT: 450 mL
pCO2 arterial: 43 mmHg (ref 32.0–48.0)
pH, Arterial: 7.35 (ref 7.350–7.450)
pO2, Arterial: 90 mmHg (ref 83.0–108.0)

## 2017-11-05 LAB — COMPREHENSIVE METABOLIC PANEL
ALBUMIN: 3.1 g/dL — AB (ref 3.5–5.0)
ALT: 23 U/L (ref 14–54)
AST: 27 U/L (ref 15–41)
Alkaline Phosphatase: 84 U/L (ref 38–126)
Anion gap: 9 (ref 5–15)
BUN: 14 mg/dL (ref 6–20)
CHLORIDE: 112 mmol/L — AB (ref 101–111)
CO2: 18 mmol/L — AB (ref 22–32)
CREATININE: 0.9 mg/dL (ref 0.44–1.00)
Calcium: 7.9 mg/dL — ABNORMAL LOW (ref 8.9–10.3)
GFR calc Af Amer: >60 mL/min (ref >60)
GFR calc non Af Amer: >60 mL/min (ref >60)
GLUCOSE: 452 mg/dL — AB (ref 65–99)
POTASSIUM: 4 mmol/L (ref 3.5–5.1)
SODIUM: 139 mmol/L (ref 135–145)
Total Bilirubin: 1.2 mg/dL (ref 0.3–1.2)
Total Protein: 6 g/dL — ABNORMAL LOW (ref 6.5–8.1)

## 2017-11-05 LAB — AMMONIA: AMMONIA: 17 umol/L (ref 9–35)

## 2017-11-05 LAB — RESPIRATORY PANEL BY PCR
ADENOVIRUS-RVPPCR: NOT DETECTED
Bordetella pertussis: NOT DETECTED
CORONAVIRUS NL63-RVPPCR: NOT DETECTED
CORONAVIRUS OC43-RVPPCR: NOT DETECTED
Chlamydophila pneumoniae: NOT DETECTED
Coronavirus 229E: NOT DETECTED
Coronavirus HKU1: NOT DETECTED
INFLUENZA A-RVPPCR: NOT DETECTED
Influenza B: NOT DETECTED
MYCOPLASMA PNEUMONIAE-RVPPCR: NOT DETECTED
Metapneumovirus: NOT DETECTED
PARAINFLUENZA VIRUS 1-RVPPCR: NOT DETECTED
PARAINFLUENZA VIRUS 4-RVPPCR: NOT DETECTED
Parainfluenza Virus 2: NOT DETECTED
Parainfluenza Virus 3: NOT DETECTED
RHINOVIRUS / ENTEROVIRUS - RVPPCR: NOT DETECTED
Respiratory Syncytial Virus: NOT DETECTED

## 2017-11-05 LAB — GLUCOSE, CAPILLARY
GLUCOSE-CAPILLARY: 428 mg/dL — AB (ref 65–99)
GLUCOSE-CAPILLARY: 290 mg/dL — AB (ref 65–99)
GLUCOSE-CAPILLARY: 91 mg/dL (ref 65–99)
Glucose-Capillary: 400 mg/dL — ABNORMAL HIGH (ref 65–99)
Glucose-Capillary: 391 mg/dL — ABNORMAL HIGH (ref 65–99)
Glucose-Capillary: 343 mg/dL — ABNORMAL HIGH (ref 65–99)
Glucose-Capillary: 268 mg/dL — ABNORMAL HIGH (ref 65–99)
Glucose-Capillary: 177 mg/dL — ABNORMAL HIGH (ref 65–99)
Glucose-Capillary: 154 mg/dL — ABNORMAL HIGH (ref 65–99)
Glucose-Capillary: 182 mg/dL — ABNORMAL HIGH (ref 65–99)
Glucose-Capillary: 166 mg/dL — ABNORMAL HIGH (ref 65–99)
Glucose-Capillary: 262 mg/dL — ABNORMAL HIGH (ref 65–99)
Glucose-Capillary: 303 mg/dL — ABNORMAL HIGH (ref 65–99)

## 2017-11-05 LAB — PROTIME-INR
INR: 1.03
Prothrombin Time: 13.4 seconds (ref 11.4–15.2)

## 2017-11-05 LAB — PHOSPHORUS: Phosphorus: 3.6 mg/dL (ref 2.5–4.6)

## 2017-11-05 LAB — CBC
HCT: 40.3 % (ref 35.0–47.0)
Hemoglobin: 13.4 g/dL (ref 12.0–16.0)
MCH: 30.1 pg (ref 26.0–34.0)
MCHC: 33.2 g/dL (ref 32.0–36.0)
MCV: 90.9 fL (ref 80.0–100.0)
PLATELETS: 203 10*3/uL (ref 150–440)
RBC: 4.43 MIL/uL (ref 3.80–5.20)
RDW: 13.1 % (ref 11.5–14.5)
WBC: 11.6 10*3/uL — AB (ref 3.6–11.0)

## 2017-11-05 LAB — PROCALCITONIN: PROCALCITONIN: 0.26 ng/mL

## 2017-11-05 LAB — TRIGLYCERIDES: Triglycerides: 192 mg/dL — ABNORMAL HIGH (ref <150)

## 2017-11-05 LAB — LACTIC ACID, PLASMA: Lactic Acid, Venous: 1.9 mmol/L (ref 0.5–1.9)

## 2017-11-05 LAB — MRSA PCR SCREENING: MRSA BY PCR: POSITIVE — AB

## 2017-11-05 LAB — TROPONIN I: Troponin I: 0.03 ng/mL (ref <0.03)

## 2017-11-05 LAB — FIBRIN DERIVATIVES D-DIMER (ARMC ONLY): Fibrin derivatives D-dimer (ARMC): 5640.39 ng/mL (FEU) — ABNORMAL HIGH (ref 0.00–499.00)

## 2017-11-05 LAB — APTT

## 2017-11-05 LAB — MAGNESIUM: Magnesium: 2 mg/dL (ref 1.7–2.4)

## 2017-11-05 LAB — FIBRINOGEN: FIBRINOGEN: 375 mg/dL (ref 210–475)

## 2017-11-05 MED ORDER — MIDAZOLAM HCL 2 MG/2ML IJ SOLN
1.0000 mg | INTRAMUSCULAR | Status: DC | PRN
  Administered 2017-11-06 (×2): 2 mg via INTRAVENOUS
  Filled 2017-11-05 (×2): qty 2

## 2017-11-05 MED ORDER — ADULT MULTIVITAMIN LIQUID CH
15.0000 mL | Freq: Every day | ORAL | Status: DC
  Administered 2017-11-06: 15 mL
  Filled 2017-11-05 (×2): qty 15

## 2017-11-05 MED ORDER — SODIUM CHLORIDE 0.45 % IV SOLN
INTRAVENOUS | Status: DC
  Administered 2017-11-05: 04:00:00 via INTRAVENOUS

## 2017-11-05 MED ORDER — IPRATROPIUM-ALBUTEROL 0.5-2.5 (3) MG/3ML IN SOLN
3.0000 mL | Freq: Four times a day (QID) | RESPIRATORY_TRACT | Status: DC
  Administered 2017-11-05 – 2017-11-08 (×13): 3 mL via RESPIRATORY_TRACT
  Filled 2017-11-05 (×13): qty 3

## 2017-11-05 MED ORDER — SODIUM CHLORIDE 0.9 % IV BOLUS (SEPSIS)
1000.0000 mL | INTRAVENOUS | Status: AC
  Administered 2017-11-05 (×2): 1000 mL via INTRAVENOUS

## 2017-11-05 MED ORDER — INSULIN ASPART 100 UNIT/ML ~~LOC~~ SOLN: 0.0000 [IU] | Freq: Every day | SUBCUTANEOUS | Status: DC

## 2017-11-05 MED ORDER — INSULIN REGULAR BOLUS VIA INFUSION
0.0000 [IU] | Freq: Three times a day (TID) | INTRAVENOUS | Status: DC
  Filled 2017-11-05: qty 10

## 2017-11-05 MED ORDER — INSULIN GLARGINE 100 UNIT/ML ~~LOC~~ SOLN
15.0000 [IU] | Freq: Every day | SUBCUTANEOUS | Status: DC
  Administered 2017-11-05: 15 [IU] via SUBCUTANEOUS
  Filled 2017-11-05 (×3): qty 0.15

## 2017-11-05 MED ORDER — ACETAMINOPHEN 325 MG PO TABS
650.0000 mg | ORAL_TABLET | ORAL | Status: DC | PRN
  Administered 2017-11-12 (×2): 650 mg via ORAL
  Filled 2017-11-05 (×2): qty 2

## 2017-11-05 MED ORDER — NYSTATIN 100000 UNIT/GM EX POWD
Freq: Two times a day (BID) | CUTANEOUS | Status: DC
  Administered 2017-11-05 – 2017-11-13 (×17): via TOPICAL
  Filled 2017-11-05 (×2): qty 15

## 2017-11-05 MED ORDER — SODIUM CHLORIDE 0.9 % IV SOLN
INTRAVENOUS | Status: DC
  Administered 2017-11-05: 3.4 [IU]/h via INTRAVENOUS
  Filled 2017-11-05: qty 1

## 2017-11-05 MED ORDER — STERILE WATER FOR INJECTION IV SOLN
INTRAVENOUS | Status: DC
  Administered 2017-11-05: 05:00:00 via INTRAVENOUS
  Filled 2017-11-05: qty 850

## 2017-11-05 MED ORDER — ASPIRIN 300 MG RE SUPP: 300.0000 mg | RECTAL | Status: DC

## 2017-11-05 MED ORDER — SODIUM BICARBONATE 8.4 % IV SOLN
150.0000 meq | Freq: Once | INTRAVENOUS | Status: AC
  Administered 2017-11-05: 150 meq via INTRAVENOUS
  Filled 2017-11-05: qty 150

## 2017-11-05 MED ORDER — DEXTROSE 5 % IV SOLN
10.0000 mg/kg | Freq: Three times a day (TID) | INTRAVENOUS | Status: DC
  Administered 2017-11-05 – 2017-11-09 (×14): 780 mg via INTRAVENOUS
  Filled 2017-11-05 (×17): qty 15.6

## 2017-11-05 MED ORDER — VITAL HIGH PROTEIN PO LIQD
1000.0000 mL | ORAL | Status: DC
  Administered 2017-11-06: 1000 mL

## 2017-11-05 MED ORDER — ENOXAPARIN SODIUM 40 MG/0.4ML ~~LOC~~ SOLN
40.0000 mg | SUBCUTANEOUS | Status: DC
  Administered 2017-11-05 – 2017-11-12 (×7): 40 mg via SUBCUTANEOUS
  Filled 2017-11-05 (×8): qty 0.4

## 2017-11-05 MED ORDER — SODIUM CHLORIDE 0.9 % IV SOLN: 250.0000 mL | INTRAVENOUS | Status: DC | PRN

## 2017-11-05 MED ORDER — PROPOFOL 1000 MG/100ML IV EMUL
INTRAVENOUS | Status: AC
  Filled 2017-11-05: qty 100

## 2017-11-05 MED ORDER — ORAL CARE MOUTH RINSE
15.0000 mL | OROMUCOSAL | Status: DC
  Administered 2017-11-05 – 2017-11-07 (×20): 15 mL via OROMUCOSAL

## 2017-11-05 MED ORDER — INSULIN ASPART 100 UNIT/ML ~~LOC~~ SOLN: 0.0000 [IU] | Freq: Four times a day (QID) | SUBCUTANEOUS | Status: DC

## 2017-11-05 MED ORDER — DEXTROSE 5 % IV SOLN
2.0000 g | Freq: Two times a day (BID) | INTRAVENOUS | Status: DC
  Administered 2017-11-05 – 2017-11-07 (×5): 2 g via INTRAVENOUS
  Filled 2017-11-05 (×6): qty 2

## 2017-11-05 MED ORDER — ASPIRIN 81 MG PO CHEW: 324.0000 mg | CHEWABLE_TABLET | ORAL | Status: DC

## 2017-11-05 MED ORDER — FENTANYL 2500MCG IN NS 250ML (10MCG/ML) PREMIX INFUSION
25.0000 ug/h | INTRAVENOUS | Status: DC
  Administered 2017-11-05: 200 ug/h via INTRAVENOUS
  Administered 2017-11-05: 250 ug/h via INTRAVENOUS
  Administered 2017-11-06: 275 ug/h via INTRAVENOUS
  Administered 2017-11-06: 350 ug/h via INTRAVENOUS
  Administered 2017-11-06: 250 ug/h via INTRAVENOUS
  Filled 2017-11-05 (×5): qty 250

## 2017-11-05 MED ORDER — NOREPINEPHRINE BITARTRATE 1 MG/ML IV SOLN
0.0000 ug/min | INTRAVENOUS | Status: DC
  Administered 2017-11-05: 2 ug/min via INTRAVENOUS
  Filled 2017-11-05: qty 16

## 2017-11-05 MED ORDER — SODIUM CHLORIDE 0.9 % IV SOLN
2.0000 g | INTRAVENOUS | Status: DC
  Administered 2017-11-05 – 2017-11-07 (×14): 2 g via INTRAVENOUS
  Filled 2017-11-05 (×19): qty 2000

## 2017-11-05 MED ORDER — SODIUM BICARBONATE 8.4 % IV SOLN
INTRAVENOUS | Status: DC
  Filled 2017-11-05 (×4): qty 150

## 2017-11-05 MED ORDER — FAMOTIDINE IN NACL 20-0.9 MG/50ML-% IV SOLN
20.0000 mg | Freq: Two times a day (BID) | INTRAVENOUS | Status: DC
  Administered 2017-11-05 (×3): 20 mg via INTRAVENOUS
  Filled 2017-11-05 (×3): qty 50

## 2017-11-05 MED ORDER — FENTANYL BOLUS VIA INFUSION
25.0000 ug | INTRAVENOUS | Status: DC | PRN
  Filled 2017-11-05: qty 100

## 2017-11-05 MED ORDER — DEXTROSE 50 % IV SOLN: 25.0000 mL | INTRAVENOUS | Status: DC | PRN

## 2017-11-05 MED ORDER — CHLORHEXIDINE GLUCONATE 0.12% ORAL RINSE (MEDLINE KIT)
15.0000 mL | Freq: Two times a day (BID) | OROMUCOSAL | Status: DC
  Administered 2017-11-05 – 2017-11-07 (×5): 15 mL via OROMUCOSAL

## 2017-11-05 MED ORDER — SODIUM CHLORIDE 0.9 % IV BOLUS (SEPSIS): 1000.0000 mL | INTRAVENOUS | Status: DC | PRN

## 2017-11-05 MED ORDER — PROPOFOL 1000 MG/100ML IV EMUL
0.0000 ug/kg/min | INTRAVENOUS | Status: AC
  Administered 2017-11-05: 40 ug/kg/min via INTRAVENOUS
  Administered 2017-11-05: 32.051 ug/kg/min via INTRAVENOUS
  Administered 2017-11-05: 30 ug/kg/min via INTRAVENOUS
  Administered 2017-11-05: 64.103 ug/kg/min via INTRAVENOUS
  Administered 2017-11-06 (×2): 40 ug/kg/min via INTRAVENOUS
  Filled 2017-11-05 (×7): qty 100

## 2017-11-05 MED ORDER — DEXAMETHASONE NICU IV SYRINGE 4 MG/ML
4.0000 mg | Freq: Two times a day (BID) | INTRAMUSCULAR | Status: DC
  Filled 2017-11-05: qty 1

## 2017-11-05 MED ORDER — MIDAZOLAM HCL 2 MG/2ML IJ SOLN
1.0000 mg | INTRAMUSCULAR | Status: DC | PRN
  Administered 2017-11-06 – 2017-11-07 (×4): 2 mg via INTRAVENOUS
  Filled 2017-11-05 (×3): qty 2

## 2017-11-05 MED ORDER — DEXTROSE-NACL 5-0.45 % IV SOLN: INTRAVENOUS | Status: DC

## 2017-11-05 MED ORDER — DEXAMETHASONE SODIUM PHOSPHATE 4 MG/ML IJ SOLN
4.0000 mg | Freq: Four times a day (QID) | INTRAMUSCULAR | Status: DC
  Administered 2017-11-05 – 2017-11-06 (×6): 4 mg via INTRAVENOUS
  Filled 2017-11-05 (×5): qty 1

## 2017-11-05 MED ORDER — DEXTROSE-NACL 5-0.45 % IV SOLN
INTRAVENOUS | Status: DC
  Administered 2017-11-05: 75 mL/h via INTRAVENOUS
  Administered 2017-11-06: 03:00:00 via INTRAVENOUS

## 2017-11-05 MED ORDER — FENTANYL CITRATE (PF) 100 MCG/2ML IJ SOLN: 50.0000 ug | Freq: Once | INTRAMUSCULAR | Status: DC

## 2017-11-05 MED ORDER — ONDANSETRON HCL 4 MG/2ML IJ SOLN
4.0000 mg | Freq: Four times a day (QID) | INTRAMUSCULAR | Status: DC | PRN
  Administered 2017-11-08 – 2017-11-09 (×2): 4 mg via INTRAVENOUS
  Filled 2017-11-05 (×2): qty 2

## 2017-11-05 MED ORDER — INSULIN ASPART 100 UNIT/ML ~~LOC~~ SOLN
0.0000 [IU] | Freq: Three times a day (TID) | SUBCUTANEOUS | Status: DC
  Administered 2017-11-05: 5 [IU] via SUBCUTANEOUS
  Filled 2017-11-05: qty 1

## 2017-11-05 NOTE — Consult Note (Signed)
PULMONARY / CRITICAL CARE MEDICINE   Name: Yvette Tyler MRN: [id number] DOB: 1945/11/12    ADMISSION DATE:  11/04/2017   CONSULTATION DATE: October 05, 2018  REFERRING MD: Dr. Jannifer Franklin  Reason: Acute hypoxic respiratory failure and new onset seizures  HISTORY OF PRESENT ILLNESS: This is a 72 year old Caucasian female with a past medical history as indicated below, history of breast cancer status post left mastectomy in 2017 who presented to the ED via EMS after being found unresponsive by her husband.  History is obtained from ED and EMS records as patient is currently intubated and sedated.  Per ED records, patient's husband reported that she had 2 days of nausea and vomiting.  Patient took Zofran and fluconazole at home.  Yesterday evening, patient went to bed and started vomiting again.  When her husband went to get her to drink upon his return he found her unresponsive with her eyes rolled back and her jaw clenched.  He then called EMS.  Patient regained consciousness when EMS and EMS arrived.  She was incoherent and and combative.  She was given 4 mg of midazolam by EMS as well as 10 mg of Haldol.  She remained combative in the ED requiring emergent intubation for airway protection.  Her ED workup was positive for severe hypoglycemia with a blood glucose level greater than 500 mg/dL, WBC 16.5K, mild lactic acidosis and mildly elevated creatinine.  She had an unsuccessful lumbar puncture in the ED.  Her CT head was negative for any acute infarct or bleed.  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  Her EKG was unremarkable except for sinus tachycardia.  She is being treated for presumptive meningitis given the abrupt presentation of her symptoms.  She is being admitted to the ICU for management of presumptive meningitis, acute hypoxic respiratory failure and hyperosmolar hyperglycemic state.  Her oncologist is Dr. Grayland Ormond.  She was last seen November 23, 2016 for a routine surveillance visit.  PAST MEDICAL HISTORY :  She  has a past medical history of Anxiety, Cancer ([place]), Depression, Diabetes mellitus without complication ([place]), DVT (deep vein thrombosis) in pregnancy ([place]), Hyperlipidemia, Hypertension, Insomnia, Osteoarthritis, Rectal bleeding, Stroke ([place]), and Suicidal ideation.  PAST SURGICAL HISTORY: She  has a past surgical history that includes Breast biopsy (Left, 10/27/2015); Vein bypass surgery; Colonoscopy; Partial mastectomy with axillary sentinel lymph node biopsy (Left, 12/08/2015); Axillary lymph node dissection (Left, 12/08/2015); and Mastectomy (Left, 12/08/2015).  Allergies  Allergen Reactions  . Hydrocodone-Acetaminophen Other (See Comments)    Increased dream activity  . Meperidine Other (See Comments)    Rapid heart rate  . Penicillins Rash    Has patient had a PCN reaction causing immediate rash, facial/tongue/throat swelling, SOB or lightheadedness with hypotension: Yes Has patient had a PCN reaction causing severe rash involving mucus membranes or skin necrosis: No Has patient had a PCN reaction that required hospitalization: No Has patient had a PCN reaction occurring within the last 10 years: No If all of the above answers are "NO", then may proceed with Cephalosporin use.     No current facility-administered medications on file prior to encounter.    Current Outpatient Medications on File Prior to Encounter  Medication Sig  . ALPRAZolam (XANAX) 0.5 MG tablet Take 1 tablet by mouth 4 (four) times daily as needed for anxiety.   Marland Kitchen glimepiride (AMARYL) 4 MG tablet Take 4 mg by mouth daily with breakfast.  . imipramine (TOFRANIL) 50  MG tablet Take 2 tablets by mouth at bedtime.  . ondansetron (ZOFRAN) 4 MG tablet Take 1 tablet by mouth every 8 (eight) hours as needed for nausea.   Marland Kitchen PARoxetine (PAXIL) 40 MG tablet Take 1 tablet by mouth daily.  . QUEtiapine (SEROQUEL) 300 MG tablet Take 1 tablet by mouth at  bedtime.  . temazepam (RESTORIL) 15 MG capsule Take 3 capsules by mouth at bedtime.    FAMILY HISTORY:  Her indicated that the status of her mother is unknown. She indicated that the status of her father is unknown. She indicated that the status of her paternal uncle is unknown. She indicated that the status of her unknown relative is unknown.   SOCIAL HISTORY: She  reports that  has never smoked. she has never used smokeless tobacco. She reports that she does not drink alcohol or use drugs.  REVIEW OF SYSTEMS:   Unable to obtain as patient is currently intubated and sedated  SUBJECTIVE:   VITAL SIGNS: BP (!) 109/59 (BP Location: Left Arm)   Pulse 94   Temp 100.3 F (37.9 C)   Resp (!) 22   Ht 5\' 7"  (1.702 m)   Wt 78 kg (171 lb 15.3 oz)   SpO2 100%   BMI 26.93 kg/m   HEMODYNAMICS:    VENTILATOR SETTINGS: Vent Mode: AC FiO2 (%):  [40 %] 40 % Set Rate:  [12 bmp] 12 bmp Vt Set:  [450 mL] 450 mL PEEP:  [5 cmH20] 5 cmH20  INTAKE / OUTPUT: No intake/output data recorded.  PHYSICAL EXAMINATION: General: No acute distress, appropriate for age Neuro: Unresponsive to voice, withdraws to noxious stimulus, corneal reflexes intact gag reflex intact HEENT: PERRLA, neck is supple with good range of motion, no JVD Cardiovascular: Apical pulse regular, S1-S2, no murmur regurg or gallop, +2 pulses bilaterally, no edema Lungs: Bilateral breath sounds, no wheezes or rhonchi, breath sounds diminished in the bases Abdomen: Nondistended, normal bowel sounds in all 4 quadrants Musculoskeletal: Positive range of motion, no joint swelling Skin: Well-healed left mastectomy scar, multiple bruises and ecchymotic areas in upper extremities  LABS:  BMET Recent Labs  Lab 11/04/17 1700  NA 134*  K 3.5  CL 97*  CO2 23  BUN 12  CREATININE 1.04*  GLUCOSE 571*    Electrolytes Recent Labs  Lab 11/04/17 1700  CALCIUM 9.1    CBC Recent Labs  Lab 11/04/17 1700  WBC 16.5*  HGB  15.6  HCT 46.9  PLT 235    Coag's Recent Labs  Lab 11/04/17 1700  INR 1.01    Sepsis Markers Recent Labs  Lab 11/04/17 1700 11/04/17 2004  LATICACIDVEN 3.5* 2.6*    ABG Recent Labs  Lab 11/04/17 2004  PHART 7.36  PCO2ART 30*  PO2ART 86    Liver Enzymes Recent Labs  Lab 11/04/17 1700  AST 38  ALT 27  ALKPHOS 113  BILITOT 1.1  ALBUMIN 3.8    Cardiac Enzymes Recent Labs  Lab 11/04/17 1700  TROPONINI <0.03    Glucose Recent Labs  Lab 11/04/17 1655 11/04/17 2054  GLUCAP 560* 439*    Imaging Ct Head Wo Contrast  Result Date: 11/04/2017 CLINICAL DATA:  Altered mental status. EXAM: CT HEAD WITHOUT CONTRAST TECHNIQUE: Contiguous axial images were obtained from the base of the skull through the vertex without intravenous contrast. COMPARISON:  Head CT 10/12/2012 FINDINGS: Examination is limited by patient motion despite multiple attempts at rescanning. Brain: Stable age related cerebral atrophy, ventriculomegaly and periventricular white  matter disease. No extra-axial fluid collections are identified. No CT findings for acute hemispheric infarction or intracranial hemorrhage. No mass lesions. The brainstem and cerebellum are normal. Vascular: No hyperdense vessel or unexpected calcification. Skull: No acute skull fracture or worrisome bone lesions. Sinuses/Orbits: Bilateral maxillary sinus, ethmoid and sphenoid sinus disease with evidence of prior sinonasal surgery. The mastoid air cells and middle ear cavities are clear. The frontal sinuses are clear. Other: No scalp lesions or hematoma. IMPRESSION: 1. Limited examination due to patient motion despite multiple attempts. 2. Age related cerebral atrophy, ventriculomegaly and periventricular white matter disease. 3. No acute intracranial findings or mass lesions. Electronically Signed   By: Marijo Sanes M.D.   On: 11/04/2017 17:09   Dg Chest Portable 1 View  Result Date: 11/04/2017 CLINICAL DATA:  Status post  intubation. EXAM: PORTABLE CHEST 1 VIEW COMPARISON:  11/04/2017 FINDINGS: The endotracheal tube is 4.7 cm above the carina. The NG tube is coursing down the esophagus and into the stomach. The cardiac silhouette, mediastinal and hilar contours are stable. There is tortuosity of the thoracic aorta. Mild mediastinal widening and mild fullness in the aorticopulmonary window may be accentuated by the supine position of the patient and the AP projection. No acute pulmonary findings. IMPRESSION: The endotracheal tube and NG tubes are in good position as above. Mild mediastinal widening likely due to the AP projection and supine position of the patient. A followup upright chest film may be helpful when able. Electronically Signed   By: Marijo Sanes M.D.   On: 11/04/2017 21:03   Dg Chest Portable 1 View  Result Date: 11/04/2017 CLINICAL DATA:  Altered mental status. EXAM: PORTABLE CHEST 1 VIEW COMPARISON:  11/29/2016. FINDINGS: Normal sized heart. Clear lungs. Stable mild prominence of the interstitial markings. Thoracic spine degenerative changes. IMPRESSION: No acute abnormality. Stable mild chronic interstitial lung disease. Electronically Signed   By: Claudie Revering M.D.   On: 11/04/2017 18:17    STUDIES:  EEG pending  CULTURES: Blood cultures x2 Urine culture Sputum culture  ANTIBIOTICS: Ampicillin respiratory 1/13> Rocephin 1/13> Vancomycin 1/13> Acyclovir 1/13>  SIGNIFICANT EVENTS: 1/12> admitted  LINES/TUBES: Peripheral IVs ET tube Foley catheter Right IJ  DISCUSSION: This is a 72 year old female presenting with new onset seizures of uncertain etiology;  most likely metabolic in origin; doubt meningitis but given that patient is immunocompromised it will be appropriate to cover her empirically for bacterial and viral meningitis;  Sepsis of unknown source and hyperosmolar hyperglycemic state  ASSESSMENT / PLAN:  PULMONARY A: Acute hypoxic respiratory failure Lactic acidosis P:    Full vent support with current settings Chest x-ray and ABG as needed Nebulized bronchodilators VAP protocol Bicarb infusion  CARDIOVASCULAR A:  Sinus tachycardia Hypovolemic shock P:  IV fluids Hemodynamic monitoring per ICU protocol Trend cardiac enzymes CVP every 4 hours Pressors if needed to maintain mean arterial blood pressure 65 and above RENAL A:   AKI P:   IV fluids Monitor and replace electrolytes  GASTROINTESTINAL A:   Nausea and vomiting likely secondary to severe hypoglycemia P:   IV fluids Famotidine for GI prophylaxis  HEMATOLOGIC A:   No acute issues  p:  Trend CBC and transfuse for hemoglobin less than 7  INFECTIOUS A:   Sepsis of unknown source Presumptive meningitis Leukocytosis P:   Antibiotics as above Follow-up cultures We will consider infectious disease consult if patient's mentation does not improve Upon precautions Trend pro-calcitonin and adjust antibiotics  ENDOCRINE A:   Hyperosmolar hyperglycemic state  Uncontrolled type 2 diabetes P:   IV insulin per protocol IV fluids Bicarb infusion NEUROLOGIC A:   New onset seizures-imaging studies unremarkable Acute encephalopathy with severe agitation P:   RASS goal: -1 to -2 Neuro checks every 2 hours IV Keppra Fentanyl infusion and as needed Versed for sedation, vent discomfort and seizure activity Neurology consult EEG ordered Broad-spectrum antibiotics and antivirals to include CNS coverage  FAMILY  - Updates: Patient's spouse updated by nursing.  No family at bedside at this time.  Will update when available  - Inter-disciplinary family meet or Palliative Care meeting due by:  day [place]. [facility] ANP-BC Pulmonary and Critical Care Medicine [facility] Pager [phone] or [phone]  NB: This document was prepared using Dragon voice recognition software and may include unintentional dictation errors.    11/05/2017, 1:32 AM

## 2017-11-05 NOTE — Consult Note (Addendum)
Pharmacy Antibiotic Note  Yvette Tyler is a 72 y.o. female admitted on 11/04/2017 with acute encephalopathy and sepsis secondary to suspected meningitis. Pharmacy has been consulted for  Meropenem and vancomycin dosing. Patient has allergy to PCN (rash)- and can not be given ampicillin.   Plan: Ke: 0.047   T1/2: 14.74   Vd: 54.6  Start vancomycin 1250mg  IV every 18 hours with 6 hour stack dosing. Trough level ordered prior to 3rd dose. Calculated trough at Css is 19.5. Will monitor renal function and adjust dose as needed.   Start meropenem 2g IV every 8 hours.    1/13 0300 Acyclovir 10 mg/kg q 8 hours added per CCM consult.  1/13 meropenem d/c. Ceftriaxone and ampicillin added per CCM. Aware of allergy and OK to proceed.  Height: 5\' 7"  (170.2 cm) Weight: 171 lb 15.3 oz (78 kg) IBW/kg (Calculated) : 61.6  Temp (24hrs), Avg:100.8 F (38.2 C), Min:100.3 F (37.9 C), Max:101.5 F (38.6 C)  Recent Labs  Lab 11/04/17 1700 11/04/17 2004  WBC 16.5*  --   CREATININE 1.04*  --   LATICACIDVEN 3.5* 2.6*    Estimated Creatinine Clearance: 51.9 mL/min (A) (by C-G formula based on SCr of 1.04 mg/dL (H)).    Allergies  Allergen Reactions  . Hydrocodone-Acetaminophen Other (See Comments)    Increased dream activity  . Meperidine Other (See Comments)    Rapid heart rate  . Penicillins Rash    Has patient had a PCN reaction causing immediate rash, facial/tongue/throat swelling, SOB or lightheadedness with hypotension: Yes Has patient had a PCN reaction causing severe rash involving mucus membranes or skin necrosis: No Has patient had a PCN reaction that required hospitalization: No Has patient had a PCN reaction occurring within the last 10 years: No If all of the above answers are "NO", then may proceed with Cephalosporin use.     Antimicrobials this admission: 1/12 meropenem >>  1/12 vancomycin >>   Dose adjustments this admission:  Microbiology results:  Thank you for  allowing pharmacy to be a part of this patient's care.  Eloise Harman, PharmD, BCPS Clinical Pharmacist 11/05/2017 3:15 AM

## 2017-11-05 NOTE — Progress Notes (Signed)
Patient transported to MRI and CCU after being intubated in ER. No incidents noted. Patient tolerated RT interventions well.

## 2017-11-05 NOTE — Progress Notes (Signed)
[facility] Progress Note Patient Name: RAKIA FRAYNE DOB: [birth year] MRN: [id number]   Date of Service  11/05/2017  HPI/Events of Note  72 y.o. female who presents with altered mental status.  Her husband states that she has had some malaise for the past couple of days.  Tonight he helped her to the bathroom and back to her room.  She then proceeded to vomit and then had what sounds like a full on tonic-clonic seizure.  In the ambulance she is started to arouse and then became very agitated and confused.  She required heavy amounts of sedating medications here, eventually requiring intubation and mechanical ventilation.  Patient's husband states that she has been complaining of a headache for the past couple of days.  Strong suspicion for meningitis versus possible atypical stroke. Now on empiric Rx with Vancomycin, Meropenum and Dexamethasone. Patient also meets sepsis criteria.  Hospitalist were called for admission. PCCM asked to assume care in ICU. VSS.  eICU Interventions  No new orders.      Intervention Category Evaluation Type: New Patient Evaluation  Lysle Dingwall 11/05/2017, 2:05 AM

## 2017-11-05 NOTE — Progress Notes (Signed)
Received pt from ER/MRI on transport vent, report received from ER RT, pt resting on Servoi vent at this time, will continue to monitor

## 2017-11-05 NOTE — Procedures (Signed)
Central Venous Catheter Insertion Procedure Note TIMAYA BOJARSKI [id number] Aug 11, 1946  Procedure: Insertion of Central Venous Catheter Indications: Assessment of intravascular volume, Drug and/or fluid administration and Frequent blood sampling  Procedure Details Consent: Risks of procedure as well as the alternatives and risks of each were explained to the (patient/caregiver).  Consent for procedure obtained. and Unable to obtain consent because of emergent medical necessity. Time Out: Verified patient identification, verified procedure, site/side was marked, verified correct patient position, special equipment/implants available, medications/allergies/relevent history reviewed, required imaging and test results available.  Performed  Maximum sterile technique was used including antiseptics, cap, gloves, gown, hand hygiene, mask and sheet. Skin prep: Chlorhexidine; local anesthetic administered A antimicrobial bonded/coated triple lumen catheter was placed in the right internal jugular vein using the Seldinger technique.  Evaluation Blood flow good Complications: No apparent complications Patient did tolerate procedure well. Chest X-ray ordered to verify placement.  CXR: normal.  Procedure performed under direct supervision of Dr.Conforti. Ultrasound utilized for realtime vessel cannulation  Rockwell Zentz S. [facility] ANP-BC Pulmonary and Critical Care Medicine [facility] Pager [phone] or [phone]  NB: This document was prepared using Dragon voice recognition software and may include unintentional dictation errors.   11/05/2017, 9:01 AM

## 2017-11-05 NOTE — Consult Note (Signed)
Reason for Consult: seizures and suspected meningitis  Referring Physician: Dr. Margaretmary Eddy   CC: seizure  HPI: Yvette Tyler is an 72 y.o. female  who presents with altered mental status.  Patient is unable to contribute information to her HPI given that she is intubated and sedated.  Her husband states that she has had some malaise for the past couple of days.  Tonight he helped her to the bathroom and back to her room.  She then proceeded to vomit and then had what sounds like a full on tonic-clonic seizure.  In the ambulance she is started to arouse and then became very agitated and confused.  She required heavy amounts of sedating medications here, eventually requiring intubation.  Patient's husband states that she has been complaining of a headache for the past couple of days.   No further seizures overnight. Was loaded on Keppra    Past Medical History:  Diagnosis Date  . Anxiety   . Cancer ([place])   . Depression   . Diabetes mellitus without complication ([place])   . DVT (deep vein thrombosis) in pregnancy ([place])   . Hyperlipidemia   . Hypertension   . Insomnia   . Osteoarthritis   . Rectal bleeding   . Stroke ([place])   . Suicidal ideation     Past Surgical History:  Procedure Laterality Date  . AXILLARY LYMPH NODE DISSECTION Left 12/08/2015   Procedure: AXILLARY LYMPH NODE DISSECTION;  Surgeon: Leonie Green, MD;  Location: [facility] ORS;  Service: General;  Laterality: Left;  POSSIBLE AXILLARY NODE DISSECTION  . BREAST BIOPSY Left 10/27/2015   path pending  . COLONOSCOPY    . MASTECTOMY Left 12/08/2015  . PARTIAL MASTECTOMY WITH AXILLARY SENTINEL LYMPH NODE BIOPSY Left 12/08/2015   Procedure: LEFT MASTECTOMY WITH AXILLARY SENTINEL LYMPH NODE BIOPSY;  Surgeon: Leonie Green, MD;  Location: [facility] ORS;  Service: General;  Laterality: Left;  Marland Kitchen VEIN BYPASS SURGERY      Family History  Problem Relation Age of Onset  . Pancreatic cancer Father 39  . Colon cancer Paternal Uncle    . Pancreatic cancer Unknown   . Heart failure Mother   . Breast cancer Mother     Social History:  reports that  has never smoked. she has never used smokeless tobacco. She reports that she does not drink alcohol or use drugs.  Allergies  Allergen Reactions  . Hydrocodone-Acetaminophen Other (See Comments)    Increased dream activity  . Meperidine Other (See Comments)    Rapid heart rate  . Penicillins Rash    Has patient had a PCN reaction causing immediate rash, facial/tongue/throat swelling, SOB or lightheadedness with hypotension: Yes Has patient had a PCN reaction causing severe rash involving mucus membranes or skin necrosis: No Has patient had a PCN reaction that required hospitalization: No Has patient had a PCN reaction occurring within the last 10 years: No If all of the above answers are "NO", then may proceed with Cephalosporin use.     Medications: I have reviewed the patient's current medications.  ROS: Unable to obtain due to intubation and sedation   Physical Examination: Blood pressure (!) 162/69, pulse 76, temperature 98.7 F (37.1 C), temperature source Axillary, resp. rate 18, height 5\' 7"  (1.702 m), weight 171 lb 15.3 oz (78 kg), SpO2 100 %.  Not following commands [place] to pain Corneals present.    Laboratory Studies:   Basic Metabolic Panel: Recent Labs  Lab 11/04/17 1700 11/05/17 0254  NA  134* 139  K 3.5 4.0  CL 97* 112*  CO2 23 18*  GLUCOSE 571* 452*  BUN 12 14  CREATININE 1.04* 0.90  CALCIUM 9.1 7.9*  MG  --  2.0  PHOS  --  3.6    Liver Function Tests: Recent Labs  Lab 11/04/17 1700 11/05/17 0254  AST 38 27  ALT 27 23  ALKPHOS 113 84  BILITOT 1.1 1.2  PROT 7.7 6.0*  ALBUMIN 3.8 3.1*   No results for input(s): LIPASE, AMYLASE in the last 168 hours. Recent Labs  Lab 11/04/17 1701 11/05/17 0254  AMMONIA 14 17    CBC: Recent Labs  Lab 11/04/17 1700 11/05/17 0254  WBC 16.5* 11.6*  NEUTROABS 14.4*  --   HGB 15.6  13.4  HCT 46.9 40.3  MCV 89.9 90.9  PLT 235 203    Cardiac Enzymes: Recent Labs  Lab 11/04/17 1700 11/05/17 0254 11/05/17 0900  TROPONINI <0.03 0.03* <0.03    BNP: Invalid input(s): POCBNP  CBG: Recent Labs  Lab 11/05/17 0503 11/05/17 0626 11/05/17 0755 11/05/17 0903 11/05/17 1004  GLUCAP 391* 343* 290* 268* 177*    Microbiology: Results for orders placed or performed during the hospital encounter of 11/04/17  Respiratory Panel by PCR     Status: None   Collection Time: 11/04/17  5:54 PM  Result Value Ref Range Status   Adenovirus NOT DETECTED NOT DETECTED Final   Coronavirus 229E NOT DETECTED NOT DETECTED Final   Coronavirus HKU1 NOT DETECTED NOT DETECTED Final   Coronavirus NL63 NOT DETECTED NOT DETECTED Final   Coronavirus OC43 NOT DETECTED NOT DETECTED Final   Metapneumovirus NOT DETECTED NOT DETECTED Final   Rhinovirus / Enterovirus NOT DETECTED NOT DETECTED Final   Influenza A NOT DETECTED NOT DETECTED Final   Influenza B NOT DETECTED NOT DETECTED Final   Parainfluenza Virus 1 NOT DETECTED NOT DETECTED Final   Parainfluenza Virus 2 NOT DETECTED NOT DETECTED Final   Parainfluenza Virus 3 NOT DETECTED NOT DETECTED Final   Parainfluenza Virus 4 NOT DETECTED NOT DETECTED Final   Respiratory Syncytial Virus NOT DETECTED NOT DETECTED Final   Bordetella pertussis NOT DETECTED NOT DETECTED Final   Chlamydophila pneumoniae NOT DETECTED NOT DETECTED Final   Mycoplasma pneumoniae NOT DETECTED NOT DETECTED Final  MRSA PCR Screening     Status: Abnormal   Collection Time: 11/05/17  2:25 AM  Result Value Ref Range Status   MRSA by PCR POSITIVE (A) NEGATIVE Final    Comment:        The GeneXpert MRSA Assay (FDA approved for NASAL specimens only), is one component of a comprehensive MRSA colonization surveillance program. It is not intended to diagnose MRSA infection nor to guide or monitor treatment for MRSA infections. RESULT CALLED TO, READ BACK BY AND  VERIFIED WITH: ALECIA RAWLINS AT 7858 ON 11/05/17 RWW Performed at [facility], [address] [place], [place] [address]     Coagulation Studies: Recent Labs    11/04/17 1700 11/05/17 0254  LABPROT 13.2 13.4  INR 1.01 1.03    Urinalysis:  Recent Labs  Lab 11/04/17 1702  COLORURINE STRAW*  LABSPEC 1.023  PHURINE 6.0  GLUCOSEU >=500*  HGBUR SMALL*  BILIRUBINUR NEGATIVE  KETONESUR 20*  PROTEINUR 30*  NITRITE NEGATIVE  LEUKOCYTESUR NEGATIVE    Lipid Panel:     Component Value Date/Time   CHOL 197 10/13/2012 0557   TRIG 192 (H) 11/05/2017 0254   TRIG 237 (H) 10/13/2012 0557  HDL 29 (L) 10/13/2012 0557   VLDL 47 (H) 10/13/2012 0557   LDLCALC 121 (H) 10/13/2012 0557    HgbA1C:  Lab Results  Component Value Date   HGBA1C 11.1 (H) 10/13/2012    Urine Drug Screen:      Component Value Date/Time   LABOPIA NONE DETECTED 11/04/2017 1702   COCAINSCRNUR NONE DETECTED 11/04/2017 1702   LABBENZ POSITIVE (A) 11/04/2017 1702   AMPHETMU NONE DETECTED 11/04/2017 1702   THCU NONE DETECTED 11/04/2017 1702   LABBARB NONE DETECTED 11/04/2017 1702    Alcohol Level: No results for input(s): ETH in the last 168 hours.  Other results:  Imaging: Ct Head Wo Contrast  Result Date: 11/04/2017 CLINICAL DATA:  Altered mental status. EXAM: CT HEAD WITHOUT CONTRAST TECHNIQUE: Contiguous axial images were obtained from the base of the skull through the vertex without intravenous contrast. COMPARISON:  Head CT 10/12/2012 FINDINGS: Examination is limited by patient motion despite multiple attempts at rescanning. Brain: Stable age related cerebral atrophy, ventriculomegaly and periventricular white matter disease. No extra-axial fluid collections are identified. No CT findings for acute hemispheric infarction or intracranial hemorrhage. No mass lesions. The brainstem and cerebellum are normal. Vascular: No hyperdense vessel or unexpected calcification. Skull: No acute skull  fracture or worrisome bone lesions. Sinuses/Orbits: Bilateral maxillary sinus, ethmoid and sphenoid sinus disease with evidence of prior sinonasal surgery. The mastoid air cells and middle ear cavities are clear. The frontal sinuses are clear. Other: No scalp lesions or hematoma. IMPRESSION: 1. Limited examination due to patient motion despite multiple attempts. 2. Age related cerebral atrophy, ventriculomegaly and periventricular white matter disease. 3. No acute intracranial findings or mass lesions. Electronically Signed   By: Marijo Sanes M.D.   On: 11/04/2017 17:09   Mr Brain Wo Contrast  Result Date: 11/05/2017 CLINICAL DATA:  Altered mental status, agitation and combative. On antibiotics for yeast infection. History of breast cancer, diabetes, hypertension, hyperlipidemia and stroke. EXAM: MRI HEAD WITHOUT CONTRAST TECHNIQUE: Multiplanar, multiecho pulse sequences of the brain and surrounding structures were obtained without intravenous contrast. COMPARISON:  CT HEAD November 04, 2017 and MRI of the head October 13, 2012. FINDINGS: Multiple sequences are mildly motion degraded. BRAIN: No reduced diffusion to suggest acute ischemia. No susceptibility artifact to suggest hemorrhage. Moderate parenchymal brain volume loss. No hydrocephalus. Similar FLAIR T2 hyperintensity posterior RIGHT insula. Patchy supratentorial white matter FLAIR T2 hyperintensities. Prominent basal ganglia perivascular spaces associated with chronic small vessel ischemic disease. No mass or mass effect. No abnormal extra-axial fluid collections. VASCULAR: Normal major intracranial vascular flow voids present at skull base. SKULL AND UPPER CERVICAL SPINE: No abnormal sellar expansion. No suspicious calvarial bone marrow signal. Craniocervical junction maintained. SINUSES/ORBITS: Moderate pan paranasal sinusitis. Status post probable FESS. The included ocular globes and orbital contents are non-suspicious. OTHER: Life-support lines in  place. IMPRESSION: 1. No acute intracranial process on this mildly motion degraded examination. 2. Stable abnormal signal posterior RIGHT insula, differential diagnosis includes infarct, old infection, less likely low grade tumor. 3. Moderate parenchymal brain volume loss. 4. Mild chronic small vessel ischemic disease. Electronically Signed   By: Elon Alas M.D.   On: 11/05/2017 02:10   Dg Chest Port 1 View  Result Date: 11/05/2017 CLINICAL DATA:  Central line placement EXAM: PORTABLE CHEST 1 VIEW COMPARISON:  11/04/2017 FINDINGS: Endotracheal tube tip is about 4.8 cm superior to carina. Esophageal tube tip projects over the gastric fundus. Right-sided central venous catheter tip superimposes the cavoatrial region. No pneumothorax. Patchy atelectasis  at the bases. Stable cardiomediastinal silhouette. IMPRESSION: 1. Right-sided central venous catheter tip overlies the cavoatrial junction. No pneumothorax. 2. Patchy atelectasis at the bilateral lung bases. Electronically Signed   By: Donavan Foil M.D.   On: 11/05/2017 03:50   Dg Chest Portable 1 View  Result Date: 11/04/2017 CLINICAL DATA:  Status post intubation. EXAM: PORTABLE CHEST 1 VIEW COMPARISON:  11/04/2017 FINDINGS: The endotracheal tube is 4.7 cm above the carina. The NG tube is coursing down the esophagus and into the stomach. The cardiac silhouette, mediastinal and hilar contours are stable. There is tortuosity of the thoracic aorta. Mild mediastinal widening and mild fullness in the aorticopulmonary window may be accentuated by the supine position of the patient and the AP projection. No acute pulmonary findings. IMPRESSION: The endotracheal tube and NG tubes are in good position as above. Mild mediastinal widening likely due to the AP projection and supine position of the patient. A followup upright chest film may be helpful when able. Electronically Signed   By: Marijo Sanes M.D.   On: 11/04/2017 21:03   Dg Chest Portable 1  View  Result Date: 11/04/2017 CLINICAL DATA:  Altered mental status. EXAM: PORTABLE CHEST 1 VIEW COMPARISON:  11/29/2016. FINDINGS: Normal sized heart. Clear lungs. Stable mild prominence of the interstitial markings. Thoracic spine degenerative changes. IMPRESSION: No acute abnormality. Stable mild chronic interstitial lung disease. Electronically Signed   By: Claudie Revering M.D.   On: 11/04/2017 18:17     Assessment/Plan:  72 y.o. female  who presents with altered mental status.  Patient is unable to contribute information to her HPI given that she is intubated and sedated.  Her husband states that she has had some malaise for the past couple of days.  Tonight he helped her to the bathroom and back to her room.  She then proceeded to vomit and then had what sounds like a full on tonic-clonic seizure.  In the ambulance she is started to arouse and then became very agitated and confused.  She required heavy amounts of sedating medications here, eventually requiring intubation.  Patient's husband states that she has been complaining of a headache for the past couple of days.   No further seizures overnight. Was loaded on Keppra  Meningitis suspected - Failed LP x 3 - she should get LP by IR if not available today then tomorrow until that time broad spectrum antibiotics and acyclovir - Would like EEG tomorrow ordered - no abnormalities on CTH/MRI brain  - Con't Keppra 1g q12 hrs  11/05/2017, 10:18 AM

## 2017-11-05 NOTE — Progress Notes (Signed)
[place] [facility] [place] NAME: Yvette Tyler    MR#:  [id number]  DATE OF BIRTH:  05-Dec-1945  SUBJECTIVE:  CHIEF COMPLAINT:  Pt is intubated  REVIEW OF SYSTEMS:  ROS unobtainable - sedated  DRUG ALLERGIES:   Allergies  Allergen Reactions  . Hydrocodone-Acetaminophen Other (See Comments)    Increased dream activity  . Meperidine Other (See Comments)    Rapid heart rate  . Penicillins Rash    Has patient had a PCN reaction causing immediate rash, facial/tongue/throat swelling, SOB or lightheadedness with hypotension: Yes Has patient had a PCN reaction causing severe rash involving mucus membranes or skin necrosis: No Has patient had a PCN reaction that required hospitalization: No Has patient had a PCN reaction occurring within the last 10 years: No If all of the above answers are "NO", then may proceed with Cephalosporin use.     VITALS:  Blood pressure 115/61, pulse 64, temperature 98.3 F (36.8 C), temperature source Axillary, resp. rate 17, height 5\' 7"  (1.702 m), weight 78 kg (171 lb 15.3 oz), SpO2 94 %.  PHYSICAL EXAMINATION:  GENERAL:  72 y.o.-year-old patient lying in the bed with no acute distress.  EYES: Pupils equal, round, reactive to light and accommodation. No scleral icterus.  HEENT: Head atraumatic, normocephalic. Oropharynx with ET tube NECK:  Supple, no jugular venous distention. No thyroid enlargement, no tenderness.  LUNGS:Mod breath sounds bilaterally, no wheezing, rales,rhonchi or crepitation. No use of accessory muscles of respiration.  CARDIOVASCULAR: S1, S2 normal. No murmurs, rubs, or gallops.  ABDOMEN: Soft, nontender, nondistended. Bowel sounds present. No organomegaly or mass.  EXTREMITIES: No pedal edema, cyanosis, or clubbing.  NEUROLOGIC: intubated  PSYCHIATRIC: The patient is intubated SKIN: No obvious rash, lesion, or ulcer.    LABORATORY PANEL:   CBC Recent Labs  Lab 11/05/17 0254   WBC 11.6*  HGB 13.4  HCT 40.3  PLT 203   ------------------------------------------------------------------------------------------------------------------  Chemistries  Recent Labs  Lab 11/05/17 0254  NA 139  K 4.0  CL 112*  CO2 18*  GLUCOSE 452*  BUN 14  CREATININE 0.90  CALCIUM 7.9*  MG 2.0  AST 27  ALT 23  ALKPHOS 84  BILITOT 1.2   ------------------------------------------------------------------------------------------------------------------  Cardiac Enzymes Recent Labs  Lab 11/05/17 1412  TROPONINI <0.03   ------------------------------------------------------------------------------------------------------------------  RADIOLOGY:  Ct Head Wo Contrast  Result Date: 11/04/2017 CLINICAL DATA:  Altered mental status. EXAM: CT HEAD WITHOUT CONTRAST TECHNIQUE: Contiguous axial images were obtained from the base of the skull through the vertex without intravenous contrast. COMPARISON:  Head CT 10/12/2012 FINDINGS: Examination is limited by patient motion despite multiple attempts at rescanning. Brain: Stable age related cerebral atrophy, ventriculomegaly and periventricular white matter disease. No extra-axial fluid collections are identified. No CT findings for acute hemispheric infarction or intracranial hemorrhage. No mass lesions. The brainstem and cerebellum are normal. Vascular: No hyperdense vessel or unexpected calcification. Skull: No acute skull fracture or worrisome bone lesions. Sinuses/Orbits: Bilateral maxillary sinus, ethmoid and sphenoid sinus disease with evidence of prior sinonasal surgery. The mastoid air cells and middle ear cavities are clear. The frontal sinuses are clear. Other: No scalp lesions or hematoma. IMPRESSION: 1. Limited examination due to patient motion despite multiple attempts. 2. Age related cerebral atrophy, ventriculomegaly and periventricular white matter disease. 3. No acute intracranial findings or mass lesions. Electronically Signed    By: Marijo Sanes M.D.   On: 11/04/2017 17:09   Mr Brain Wo Contrast  Result Date: 11/05/2017 CLINICAL DATA:  Altered mental status, agitation and combative. On antibiotics for yeast infection. History of breast cancer, diabetes, hypertension, hyperlipidemia and stroke. EXAM: MRI HEAD WITHOUT CONTRAST TECHNIQUE: Multiplanar, multiecho pulse sequences of the brain and surrounding structures were obtained without intravenous contrast. COMPARISON:  CT HEAD November 04, 2017 and MRI of the head October 13, 2012. FINDINGS: Multiple sequences are mildly motion degraded. BRAIN: No reduced diffusion to suggest acute ischemia. No susceptibility artifact to suggest hemorrhage. Moderate parenchymal brain volume loss. No hydrocephalus. Similar FLAIR T2 hyperintensity posterior RIGHT insula. Patchy supratentorial white matter FLAIR T2 hyperintensities. Prominent basal ganglia perivascular spaces associated with chronic small vessel ischemic disease. No mass or mass effect. No abnormal extra-axial fluid collections. VASCULAR: Normal major intracranial vascular flow voids present at skull base. SKULL AND UPPER CERVICAL SPINE: No abnormal sellar expansion. No suspicious calvarial bone marrow signal. Craniocervical junction maintained. SINUSES/ORBITS: Moderate pan paranasal sinusitis. Status post probable FESS. The included ocular globes and orbital contents are non-suspicious. OTHER: Life-support lines in place. IMPRESSION: 1. No acute intracranial process on this mildly motion degraded examination. 2. Stable abnormal signal posterior RIGHT insula, differential diagnosis includes infarct, old infection, less likely low grade tumor. 3. Moderate parenchymal brain volume loss. 4. Mild chronic small vessel ischemic disease. Electronically Signed   By: Elon Alas M.D.   On: 11/05/2017 02:10   Dg Chest Port 1 View  Result Date: 11/05/2017 CLINICAL DATA:  Central line placement EXAM: PORTABLE CHEST 1 VIEW COMPARISON:   11/04/2017 FINDINGS: Endotracheal tube tip is about 4.8 cm superior to carina. Esophageal tube tip projects over the gastric fundus. Right-sided central venous catheter tip superimposes the cavoatrial region. No pneumothorax. Patchy atelectasis at the bases. Stable cardiomediastinal silhouette. IMPRESSION: 1. Right-sided central venous catheter tip overlies the cavoatrial junction. No pneumothorax. 2. Patchy atelectasis at the bilateral lung bases. Electronically Signed   By: Donavan Foil M.D.   On: 11/05/2017 03:50   Dg Chest Portable 1 View  Result Date: 11/04/2017 CLINICAL DATA:  Status post intubation. EXAM: PORTABLE CHEST 1 VIEW COMPARISON:  11/04/2017 FINDINGS: The endotracheal tube is 4.7 cm above the carina. The NG tube is coursing down the esophagus and into the stomach. The cardiac silhouette, mediastinal and hilar contours are stable. There is tortuosity of the thoracic aorta. Mild mediastinal widening and mild fullness in the aorticopulmonary window may be accentuated by the supine position of the patient and the AP projection. No acute pulmonary findings. IMPRESSION: The endotracheal tube and NG tubes are in good position as above. Mild mediastinal widening likely due to the AP projection and supine position of the patient. A followup upright chest film may be helpful when able. Electronically Signed   By: Marijo Sanes M.D.   On: 11/04/2017 21:03   Dg Chest Portable 1 View  Result Date: 11/04/2017 CLINICAL DATA:  Altered mental status. EXAM: PORTABLE CHEST 1 VIEW COMPARISON:  11/29/2016. FINDINGS: Normal sized heart. Clear lungs. Stable mild prominence of the interstitial markings. Thoracic spine degenerative changes. IMPRESSION: No acute abnormality. Stable mild chronic interstitial lung disease. Electronically Signed   By: Claudie Revering M.D.   On: 11/04/2017 18:17    EKG:   Orders placed or performed during the hospital encounter of 11/04/17  . ED EKG  . ED EKG  . EKG 12-Lead  . EKG  12-Lead    ASSESSMENT AND PLAN:   Sepsis ([place]) -suspicion for meningitis as her source.  Failed LP * 3 in ED,recommending LP  by IR Rocephin, ampicillin, acyclovir, vanc  IV antibiotics , IV Decadron cultures ordered.   Lactic acid is elevated,  trend her lactate until within normal limits  IV fluids     Acute encephalopathy -due to meningitis   Acute resp failure Intubated , vent management per intensivist    Seizure -ikely due to her encephalopathy loaded with IV Keppra, continue Keppra q 12 hrs MRI brain no acute findings    neurology consult appreciated  EEG    HTN (hypertension) -stable, currently normotensive, can use as needed antihypertensives if her blood pressure rises, continue home meds when she is more alert and taking p.o.    Diabetes ([place]) -sliding scale insulin with corresponding glucose checks    HLD (hyperlipidemia) -resume home med  anti-lipids when she is more alert and taking p.o      All the records are reviewed and case discussed with Care Management/Social Workerr. Management plans discussed with the patient, family and they are in agreement.  CODE STATUS: fc   TOTAL TIME TAKING CARE OF THIS PATIENT: 35 minutes.   POSSIBLE D/C IN 3 DAYS, DEPENDING ON CLINICAL CONDITION.  Note: This dictation was prepared with Dragon dictation along with smaller phrase technology. Any transcriptional errors that result from this process are unintentional.   Nicholes Mango M.D on 11/05/2017 at 10:30 PM  Between 7am to 6pm - Pager - [phone] After 6pm go to [email or web address] - password [email or web address] [facility]  [facility]  Office  [phone]  CC: Primary care physician; Glendon Axe, MD

## 2017-11-05 NOTE — ED Notes (Signed)
Patient to MRI with primary RN.

## 2017-11-05 NOTE — Progress Notes (Signed)
Initial Nutrition Assessment  DOCUMENTATION CODES:   Not applicable  INTERVENTION:  Plan is to initiate tube feeds following lumbar puncture. After lumbar puncture, recommend initiating Vital High Protein at 15 mL/hr and advancing every 8 hours to goal rate of Vital High Protein at 55 mL/hr (1320 mL goal daily volume) via OGT. Goal rate provides 1320 kcal, 116 grams of protein, 1109 mL H2O daily. With current propofol rate provides 1690 kcal daily.  Provide liquid MVI daily per tube as goal TF rate does not meet 100% RDIs for vitamins/minerals.  NUTRITION DIAGNOSIS:   Inadequate oral intake related to inability to eat as evidenced by NPO status.  GOAL:   Provide needs based on ASPEN/SCCM guidelines  MONITOR:   Vent status, Labs, Weight trends, TF tolerance, Skin, I & O's  REASON FOR ASSESSMENT:   Ventilator    ASSESSMENT:   72 year old female with PMHx of anxiety, depression, DM type 2, hx CVA, HLD, HTN, OA, hx breast cancer s/p partial left mastectomy 12/08/2015 who presents with AMS after a few day history of malaise, headache, N/V, and suffered what is thought to be tonic-clonic seizure at home. Patient was intubated 11/04/2017. Patient has sepsis, acute encephalopathy with severe agitation, concern for meningitis.   Patient intubated and sedated. No family members present at time of RD assessment. Per chart patient was 206-213 lbs in 2017. Appears to have been at current weight since 11/2016.  Access: OGT placed 1/13; terminates in gastric fundus per chest x-ray today; 64 cm at corner of mouth  MAP: 57-79 mmHg; however patient is off pressors now; received 2 L fluid bolus this AM  Patient is currently intubated on ventilator support MV: 7.6 L/min Temp (24hrs), Avg:100.7 F (38.2 C), Min:98.7 F (37.1 C), Max:101.5 F (38.6 C)  Propofol: 14 ml/hr (370 kcal daily)  Medications reviewed and include: Decadron 4 mg Q6hrs IV, regular insulin 0-10 units TID, acyclovir,  ampicillin, ceftriaxone, famotidine, fentanyl gtt, regular insulin gtt, Keppra, norepinephrine gtt now off, propofol gtt, vancomycin.  Labs reviewed: CBG 268-560, Chloride 112, CO2 18, Glucose 452.  Patient does not meet criteria for malnutrition at this time.  Discussed with RN. Also discussed with Dr. Jefferson Fuel. Okay to start tube feeds following lumbar puncture.  NUTRITION - FOCUSED PHYSICAL EXAM:    Most Recent Value  Orbital Region  No depletion  Upper Arm Region  Unable to assess  Thoracic and Lumbar Region  No depletion  Buccal Region  No depletion  Temple Region  No depletion  Clavicle Bone Region  No depletion  Clavicle and Acromion Bone Region  No depletion  Scapular Bone Region  Unable to assess  Dorsal Hand  Unable to assess  Patellar Region  No depletion  Anterior Thigh Region  No depletion  Posterior Calf Region  No depletion  Edema (RD Assessment)  None  Hair  Reviewed  Eyes  Unable to assess  Mouth  Unable to assess  Skin  Reviewed [pallor,  ecchymosis to bilateral arms]  Nails  Reviewed     Diet Order:  Diet NPO time specified  EDUCATION NEEDS:   No education needs have been identified at this time  Skin:  Skin Assessment: Skin Integrity Issues: Skin Integrity Issues:: Other (Comment) Other: ecchymosis bilateral arms; skin tear distal right arm; ITD to groin and breast  Last BM:  Unknown  Height:   Ht Readings from Last 1 Encounters:  11/04/17 5\' 7"  (1.702 m)    Weight:   Wt Readings  from Last 1 Encounters:  11/04/17 171 lb 15.3 oz (78 kg)    Ideal Body Weight:  61.4 kg  BMI:  Body mass index is 26.93 kg/m.  Estimated Nutritional Needs:   Kcal:  1740 (PSU 2003b w/ MSJ 1323, Ve 7.6, Tmax 38.6)  Protein:  94-117 grams (1.2-1.5 grams/kg)  Fluid:  1.9 L/day (25 mL/kg)  Willey Blade, MS, RD, LDN Office: [phone] Pager: [phone] After Hours/Weekend Pager: [phone]

## 2017-11-05 NOTE — ED Notes (Signed)
This RN to MRI with pt. Pt transferred to MRI equipment or cardiac monitor, IV pump and vent. Temp floey removed by thisRN at the instruction of the MRI tech due to foley having metal in the temp probe. Pt blood pressure at this time 139/72 HR of 97 and O2 sat of 100%. This RN to stay with pt until MRI completed and then will transport to ICU.

## 2017-11-06 ENCOUNTER — Inpatient Hospital Stay: Payer: Medicare Other

## 2017-11-06 ENCOUNTER — Inpatient Hospital Stay (HOSPITAL_COMMUNITY): Payer: Medicare Other

## 2017-11-06 DIAGNOSIS — J9600 Acute respiratory failure, unspecified whether with hypoxia or hypercapnia: Secondary | ICD-10-CM

## 2017-11-06 DIAGNOSIS — A419 Sepsis, unspecified organism: Secondary | ICD-10-CM

## 2017-11-06 LAB — BASIC METABOLIC PANEL
Anion gap: 12 (ref 5–15)
BUN: 19 mg/dL (ref 6–20)
CALCIUM: 7.7 mg/dL — AB (ref 8.9–10.3)
CO2: 23 mmol/L (ref 22–32)
CREATININE: 0.74 mg/dL (ref 0.44–1.00)
Chloride: 104 mmol/L (ref 101–111)
Glucose, Bld: 418 mg/dL — ABNORMAL HIGH (ref 65–99)
Potassium: 2.7 mmol/L — CL (ref 3.5–5.1)
SODIUM: 139 mmol/L (ref 135–145)

## 2017-11-06 LAB — CSF CELL COUNT WITH DIFFERENTIAL
Eosinophils, CSF: 0 %
LYMPHS CSF: 43 %
MONOCYTE-MACROPHAGE-SPINAL FLUID: 30 %
OTHER CELLS CSF: 0
RBC COUNT CSF: 10 /mm3 — AB (ref 0–3)
Segmented Neutrophils-CSF: 27 %
TUBE #: 3
WBC, CSF: 2 /mm3 (ref 0–5)

## 2017-11-06 LAB — GLUCOSE, CAPILLARY
GLUCOSE-CAPILLARY: 330 mg/dL — AB (ref 65–99)
GLUCOSE-CAPILLARY: 309 mg/dL — AB (ref 65–99)
GLUCOSE-CAPILLARY: 254 mg/dL — AB (ref 65–99)
Glucose-Capillary: 302 mg/dL — ABNORMAL HIGH (ref 65–99)
Glucose-Capillary: 244 mg/dL — ABNORMAL HIGH (ref 65–99)
Glucose-Capillary: 229 mg/dL — ABNORMAL HIGH (ref 65–99)

## 2017-11-06 LAB — URINE CULTURE: Culture: NO GROWTH

## 2017-11-06 LAB — CBC
HCT: 36.3 % (ref 35.0–47.0)
Hemoglobin: 12.2 g/dL (ref 12.0–16.0)
MCH: 30.1 pg (ref 26.0–34.0)
MCHC: 33.6 g/dL (ref 32.0–36.0)
MCV: 89.8 fL (ref 80.0–100.0)
PLATELETS: 170 10*3/uL (ref 150–440)
RBC: 4.04 MIL/uL (ref 3.80–5.20)
RDW: 13.4 % (ref 11.5–14.5)
WBC: 13.2 10*3/uL — AB (ref 3.6–11.0)

## 2017-11-06 LAB — VANCOMYCIN, TROUGH: VANCOMYCIN TR: 11 ug/mL — AB (ref 15–20)

## 2017-11-06 LAB — GLUCOSE, CSF: Glucose, CSF: 220 mg/dL — ABNORMAL HIGH (ref 40–70)

## 2017-11-06 LAB — PROTEIN, CSF: Total  Protein, CSF: 44 mg/dL (ref 15–45)

## 2017-11-06 LAB — TRIGLYCERIDES: Triglycerides: 77 mg/dL (ref <150)

## 2017-11-06 LAB — PROLACTIN: Prolactin: 43.9 ng/mL — ABNORMAL HIGH (ref 4.8–23.3)

## 2017-11-06 LAB — MAGNESIUM: MAGNESIUM: 1.8 mg/dL (ref 1.7–2.4)

## 2017-11-06 LAB — PHOSPHORUS: Phosphorus: 2.8 mg/dL (ref 2.5–4.6)

## 2017-11-06 LAB — PROCALCITONIN: PROCALCITONIN: 0.16 ng/mL

## 2017-11-06 MED ORDER — MUPIROCIN 2 % EX OINT
1.0000 "application " | TOPICAL_OINTMENT | Freq: Two times a day (BID) | CUTANEOUS | Status: AC
  Administered 2017-11-06 – 2017-11-10 (×9): 1 via NASAL
  Filled 2017-11-06 (×2): qty 22

## 2017-11-06 MED ORDER — FAMOTIDINE 20 MG PO TABS
20.0000 mg | ORAL_TABLET | Freq: Every day | ORAL | Status: DC
  Administered 2017-11-06: 20 mg
  Filled 2017-11-06: qty 1

## 2017-11-06 MED ORDER — POTASSIUM CHLORIDE 2 MEQ/ML IV SOLN
INTRAVENOUS | Status: DC
  Administered 2017-11-06: 11:00:00 via INTRAVENOUS
  Filled 2017-11-06 (×3): qty 1000

## 2017-11-06 MED ORDER — POTASSIUM CHLORIDE 10 MEQ/50ML IV SOLN
10.0000 meq | INTRAVENOUS | Status: DC
  Administered 2017-11-06 (×2): 10 meq via INTRAVENOUS
  Filled 2017-11-06 (×6): qty 50

## 2017-11-06 MED ORDER — PROPOFOL 1000 MG/100ML IV EMUL
5.0000 ug/kg/min | INTRAVENOUS | Status: DC
  Administered 2017-11-06: 5 ug/kg/min via INTRAVENOUS
  Filled 2017-11-06 (×2): qty 100

## 2017-11-06 MED ORDER — POTASSIUM CHLORIDE 10 MEQ/50ML IV SOLN
10.0000 meq | INTRAVENOUS | Status: AC
  Administered 2017-11-06 (×2): 10 meq via INTRAVENOUS
  Filled 2017-11-06 (×4): qty 50

## 2017-11-06 MED ORDER — INSULIN GLARGINE 100 UNIT/ML ~~LOC~~ SOLN
30.0000 [IU] | Freq: Every day | SUBCUTANEOUS | Status: DC
  Administered 2017-11-06: 30 [IU] via SUBCUTANEOUS
  Filled 2017-11-06 (×2): qty 0.3

## 2017-11-06 MED ORDER — INSULIN ASPART 100 UNIT/ML ~~LOC~~ SOLN
0.0000 [IU] | SUBCUTANEOUS | Status: DC
  Administered 2017-11-06: 15 [IU] via SUBCUTANEOUS
  Filled 2017-11-06: qty 1

## 2017-11-06 MED ORDER — POTASSIUM CHLORIDE 20 MEQ/15ML (10%) PO SOLN
40.0000 meq | Freq: Two times a day (BID) | ORAL | Status: AC
  Administered 2017-11-06 (×2): 40 meq
  Filled 2017-11-06 (×2): qty 30

## 2017-11-06 MED ORDER — DEXMEDETOMIDINE HCL IN NACL 400 MCG/100ML IV SOLN
0.0000 ug/kg/h | INTRAVENOUS | Status: DC
  Administered 2017-11-06: 1.2 ug/kg/h via INTRAVENOUS
  Administered 2017-11-06: 0.4 ug/kg/h via INTRAVENOUS
  Filled 2017-11-06 (×2): qty 100

## 2017-11-06 MED ORDER — INSULIN ASPART 100 UNIT/ML ~~LOC~~ SOLN
0.0000 [IU] | SUBCUTANEOUS | Status: DC
  Administered 2017-11-06: 5 [IU] via SUBCUTANEOUS
  Administered 2017-11-06: 11 [IU] via SUBCUTANEOUS
  Administered 2017-11-06: 5 [IU] via SUBCUTANEOUS
  Administered 2017-11-06: 8 [IU] via SUBCUTANEOUS
  Administered 2017-11-07: 2 [IU] via SUBCUTANEOUS
  Administered 2017-11-07: 3 [IU] via SUBCUTANEOUS
  Administered 2017-11-07 – 2017-11-08 (×3): 2 [IU] via SUBCUTANEOUS
  Filled 2017-11-06 (×9): qty 1

## 2017-11-06 MED ORDER — CHLORHEXIDINE GLUCONATE CLOTH 2 % EX PADS
6.0000 | MEDICATED_PAD | Freq: Every day | CUTANEOUS | Status: AC
  Administered 2017-11-06 – 2017-11-10 (×4): 6 via TOPICAL

## 2017-11-06 MED ORDER — VANCOMYCIN HCL IN DEXTROSE 1-5 GM/200ML-% IV SOLN
1000.0000 mg | Freq: Two times a day (BID) | INTRAVENOUS | Status: DC
  Administered 2017-11-06 – 2017-11-07 (×2): 1000 mg via INTRAVENOUS
  Filled 2017-11-06 (×4): qty 200

## 2017-11-06 MED ORDER — LIDOCAINE HCL (PF) 1 % IJ SOLN
5.0000 mL | Freq: Once | INTRAMUSCULAR | Status: DC
  Filled 2017-11-06: qty 5

## 2017-11-06 NOTE — Progress Notes (Signed)
Pt had axillary temp of 96 F at 2000. Husband states that her temps "tend to run low". Heated blankets applied at 2030. Temp was 95.7 F on reassessment at 2200. Bear hugger has been applied. Will continue to monitor.

## 2017-11-06 NOTE — Progress Notes (Signed)
Since 2300 Pt has been experiencing bradycardia with HR staying in the 50s. Pt is not symptomatic w/ BP and O2 sats staying within goal. Patria Mane, NP notified. Current orders are to titrate down on propofol/fentanyl and to notify of ongoing bradycardia. Will continue to monitor.

## 2017-11-06 NOTE — Progress Notes (Signed)
Per radiology, patient needs to lie flat for 24 hours s/p LP. Dr. Alva Garnet contacted and made aware of this. He advised to proceed w/ tube feeds only at trickle rate (do not increase), as well as the meds that are ordered via tube. No tube flushes ordered. Simonds ordered this be continued until the patient can have HOB elevated back to normal intubated position. This instruction passed on in this PM report.

## 2017-11-06 NOTE — Progress Notes (Addendum)
Inpatient Diabetes Program Recommendations  AACE/ADA: New Consensus Statement on Inpatient Glycemic Control (2015)  Target Ranges:  Prepandial:   less than 140 mg/dL      Peak postprandial:   less than 180 mg/dL (1-2 hours)      Critically ill patients:  140 - 180 mg/dL   Lab Results  Component Value Date   GLUCAP 330 (H) 11/06/2017   HGBA1C 11.1 (H) 10/13/2012    Review of Glycemic Control Results for TEREZ, MONTEE" (MRN [id number]) as of 11/06/2017 11:29  Ref. Range 11/05/2017 15:47 11/05/2017 18:19 11/05/2017 22:29 11/06/2017 07:09  Glucose-Capillary Latest Ref Range: 65 - 99 mg/dL 166 (H) 262 (H) 303 (H) 330 (H)   Diabetes history: Type 2 DM Outpatient Diabetes medications: Amaryl 4 mg BID, per outpatient office visit note on 04/2017, will need to verify Current orders for Inpatient glycemic control: Lantus 30 Units HS, Novolog 0-15 Units Q4H  Inpatient Diabetes Program Recommendations:    Noted Lantus was increased to 30 Units QHS this AM. Also, patient on vital and on dexamethasone 4 mg Q6H, causing increases to blood sugars. Consider Novolog 4 units Q4H with tube feeds.  Last A1C was back in 2013, consider repeating A1C.  Lastly, as steroids are decreased or as meals/tube feeds are changed, may need to adjust insulin. Will follow.   Thanks, Bronson Curb, MSN, RNC-OB Diabetes Coordinator [phone] (8a-5p)

## 2017-11-06 NOTE — Consult Note (Signed)
No changes overnight. On broad spectrum antibiotics.       Past Medical History:  Diagnosis Date  . Anxiety   . Cancer ([place])   . Depression   . Diabetes mellitus without complication ([place])   . DVT (deep vein thrombosis) in pregnancy ([place])   . Hyperlipidemia   . Hypertension   . Insomnia   . Osteoarthritis   . Rectal bleeding   . Stroke ([place])   . Suicidal ideation     Past Surgical History:  Procedure Laterality Date  . AXILLARY LYMPH NODE DISSECTION Left 12/08/2015   Procedure: AXILLARY LYMPH NODE DISSECTION;  Surgeon: Leonie Green, MD;  Location: [facility] ORS;  Service: General;  Laterality: Left;  POSSIBLE AXILLARY NODE DISSECTION  . BREAST BIOPSY Left 10/27/2015   path pending  . COLONOSCOPY    . MASTECTOMY Left 12/08/2015  . PARTIAL MASTECTOMY WITH AXILLARY SENTINEL LYMPH NODE BIOPSY Left 12/08/2015   Procedure: LEFT MASTECTOMY WITH AXILLARY SENTINEL LYMPH NODE BIOPSY;  Surgeon: Leonie Green, MD;  Location: [facility] ORS;  Service: General;  Laterality: Left;  Marland Kitchen VEIN BYPASS SURGERY      Family History  Problem Relation Age of Onset  . Pancreatic cancer Father 66  . Colon cancer Paternal Uncle   . Pancreatic cancer Unknown   . Heart failure Mother   . Breast cancer Mother     Social History:  reports that  has never smoked. she has never used smokeless tobacco. She reports that she does not drink alcohol or use drugs.  Allergies  Allergen Reactions  . Hydrocodone-Acetaminophen Other (See Comments)    Increased dream activity  . Meperidine Other (See Comments)    Rapid heart rate  . Penicillins Rash    Has patient had a PCN reaction causing immediate rash, facial/tongue/throat swelling, SOB or lightheadedness with hypotension: Yes Has patient had a PCN reaction causing severe rash involving mucus membranes or skin necrosis: No Has patient had a PCN reaction that required hospitalization: No Has patient had a PCN reaction occurring within the last 10 years:  No If all of the above answers are "NO", then may proceed with Cephalosporin use.     Medications: I have reviewed the patient's current medications.  ROS: Unable to obtain due to intubation and sedation   Physical Examination: Blood pressure (!) 126/55, pulse 86, temperature 97.9 F (36.6 C), temperature source Axillary, resp. rate 18, height 5\' 7"  (1.702 m), weight 192 lb 0.3 oz (87.1 kg), SpO2 92 %.  Not following commands [place] to pain Corneals present.    Laboratory Studies:   Basic Metabolic Panel: Recent Labs  Lab 11/04/17 1700 11/05/17 0254 11/06/17 0431  NA 134* 139 139  K 3.5 4.0 2.7*  CL 97* 112* 104  CO2 23 18* 23  GLUCOSE 571* 452* 418*  BUN 12 14 19   CREATININE 1.04* 0.90 0.74  CALCIUM 9.1 7.9* 7.7*  MG  --  2.0 1.8  PHOS  --  3.6 2.8    Liver Function Tests: Recent Labs  Lab 11/04/17 1700 11/05/17 0254  AST 38 27  ALT 27 23  ALKPHOS 113 84  BILITOT 1.1 1.2  PROT 7.7 6.0*  ALBUMIN 3.8 3.1*   No results for input(s): LIPASE, AMYLASE in the last 168 hours. Recent Labs  Lab 11/04/17 1701 11/05/17 0254  AMMONIA 14 17    CBC: Recent Labs  Lab 11/04/17 1700 11/05/17 0254 11/06/17 0600  WBC 16.5* 11.6* 13.2*  NEUTROABS 14.4*  --   --  HGB 15.6 13.4 12.2  HCT 46.9 40.3 36.3  MCV 89.9 90.9 89.8  PLT 235 203 170    Cardiac Enzymes: Recent Labs  Lab 11/04/17 1700 11/05/17 0254 11/05/17 0900 11/05/17 1412  TROPONINI <0.03 0.03* <0.03 <0.03    BNP: Invalid input(s): POCBNP  CBG: Recent Labs  Lab 11/05/17 1405 11/05/17 1547 11/05/17 1819 11/05/17 2229 11/06/17 0709  GLUCAP 91 166* 262* 303* 330*    Microbiology: Results for orders placed or performed during the hospital encounter of 11/04/17  Urine Culture     Status: None   Collection Time: 11/04/17  5:02 PM  Result Value Ref Range Status   Specimen Description   Final    URINE, RANDOM Performed at [facility], [address], [place], [place]  [address]    Special Requests   Final    NONE Performed at [facility], [address] [place], [place] [address]    Culture   Final    NO GROWTH Performed at [facility], [place] [address] [place], [place] [address]    Report Status 11/06/2017 FINAL  Final  Respiratory Panel by PCR     Status: None   Collection Time: 11/04/17  5:54 PM  Result Value Ref Range Status   Adenovirus NOT DETECTED NOT DETECTED Final   Coronavirus 229E NOT DETECTED NOT DETECTED Final   Coronavirus HKU1 NOT DETECTED NOT DETECTED Final   Coronavirus NL63 NOT DETECTED NOT DETECTED Final   Coronavirus OC43 NOT DETECTED NOT DETECTED Final   Metapneumovirus NOT DETECTED NOT DETECTED Final   Rhinovirus / Enterovirus NOT DETECTED NOT DETECTED Final   Influenza A NOT DETECTED NOT DETECTED Final   Influenza B NOT DETECTED NOT DETECTED Final   Parainfluenza Virus 1 NOT DETECTED NOT DETECTED Final   Parainfluenza Virus 2 NOT DETECTED NOT DETECTED Final   Parainfluenza Virus 3 NOT DETECTED NOT DETECTED Final   Parainfluenza Virus 4 NOT DETECTED NOT DETECTED Final   Respiratory Syncytial Virus NOT DETECTED NOT DETECTED Final   Bordetella pertussis NOT DETECTED NOT DETECTED Final   Chlamydophila pneumoniae NOT DETECTED NOT DETECTED Final   Mycoplasma pneumoniae NOT DETECTED NOT DETECTED Final  MRSA PCR Screening     Status: Abnormal   Collection Time: 11/05/17  2:25 AM  Result Value Ref Range Status   MRSA by PCR POSITIVE (A) NEGATIVE Final    Comment:        The GeneXpert MRSA Assay (FDA approved for NASAL specimens only), is one component of a comprehensive MRSA colonization surveillance program. It is not intended to diagnose MRSA infection nor to guide or monitor treatment for MRSA infections. RESULT CALLED TO, READ BACK BY AND VERIFIED WITH: ALECIA RAWLINS AT 3532 ON 11/05/17 RWW Performed at [facility], [address] [place], [place] [address]   Culture, blood (Routine X 2)  w Reflex to ID Panel     Status: None (Preliminary result)   Collection Time: 11/05/17  6:27 AM  Result Value Ref Range Status   Specimen Description BLOOD RIGHT WRIST  Final   Special Requests   Final    BOTTLES DRAWN AEROBIC AND ANAEROBIC Blood Culture adequate volume   Culture   Final    NO GROWTH 1 DAY Performed at [facility], [address], [place], [place] [address]    Report Status PENDING  Incomplete  Culture, blood (Routine X 2) w Reflex to ID Panel     Status: None (Preliminary result)  Collection Time: 11/05/17  6:27 AM  Result Value Ref Range Status   Specimen Description BLOOD RIGHT HAND  Final   Special Requests   Final    BOTTLES DRAWN AEROBIC AND ANAEROBIC Blood Culture adequate volume   Culture   Final    NO GROWTH 1 DAY Performed at [facility], [address], [place], [place] [address]    Report Status PENDING  Incomplete  Culture, respiratory (NON-Expectorated)     Status: None (Preliminary result)   Collection Time: 11/05/17  3:26 PM  Result Value Ref Range Status   Specimen Description   Final    TRACHEAL ASPIRATE Performed at [facility], [address] [place] [address], [place], [place] [address]    Special Requests   Final    NONE Performed at [facility], [address] [place], [place] [address]    Gram Stain   Final    FEW WBC PRESENT, PREDOMINANTLY MONONUCLEAR NO ORGANISMS SEEN Performed at [facility], [place] [address] [place] [address], [place], [place] [address]    Culture PENDING  Incomplete   Report Status PENDING  Incomplete    Coagulation Studies: Recent Labs    11/04/17 1700 11/05/17 0254  LABPROT 13.2 13.4  INR 1.01 1.03    Urinalysis:  Recent Labs  Lab 11/04/17 1702  COLORURINE STRAW*  LABSPEC 1.023  PHURINE 6.0  GLUCOSEU >=500*  HGBUR SMALL*  BILIRUBINUR NEGATIVE  KETONESUR 20*  PROTEINUR 30*  NITRITE NEGATIVE  LEUKOCYTESUR NEGATIVE    Lipid Panel:     Component Value Date/Time   CHOL 197  10/13/2012 0557   TRIG 192 (H) 11/05/2017 0254   TRIG 237 (H) 10/13/2012 0557   HDL 29 (L) 10/13/2012 0557   VLDL 47 (H) 10/13/2012 0557   LDLCALC 121 (H) 10/13/2012 0557    HgbA1C:  Lab Results  Component Value Date   HGBA1C 11.1 (H) 10/13/2012    Urine Drug Screen:      Component Value Date/Time   LABOPIA NONE DETECTED 11/04/2017 1702   COCAINSCRNUR NONE DETECTED 11/04/2017 1702   LABBENZ POSITIVE (A) 11/04/2017 1702   AMPHETMU NONE DETECTED 11/04/2017 1702   THCU NONE DETECTED 11/04/2017 1702   LABBARB NONE DETECTED 11/04/2017 1702    Alcohol Level: No results for input(s): ETH in the last 168 hours.  Other results:  Imaging: Ct Head Wo Contrast  Result Date: 11/04/2017 CLINICAL DATA:  Altered mental status. EXAM: CT HEAD WITHOUT CONTRAST TECHNIQUE: Contiguous axial images were obtained from the base of the skull through the vertex without intravenous contrast. COMPARISON:  Head CT 10/12/2012 FINDINGS: Examination is limited by patient motion despite multiple attempts at rescanning. Brain: Stable age related cerebral atrophy, ventriculomegaly and periventricular white matter disease. No extra-axial fluid collections are identified. No CT findings for acute hemispheric infarction or intracranial hemorrhage. No mass lesions. The brainstem and cerebellum are normal. Vascular: No hyperdense vessel or unexpected calcification. Skull: No acute skull fracture or worrisome bone lesions. Sinuses/Orbits: Bilateral maxillary sinus, ethmoid and sphenoid sinus disease with evidence of prior sinonasal surgery. The mastoid air cells and middle ear cavities are clear. The frontal sinuses are clear. Other: No scalp lesions or hematoma. IMPRESSION: 1. Limited examination due to patient motion despite multiple attempts. 2. Age related cerebral atrophy, ventriculomegaly and periventricular white matter disease. 3. No acute intracranial findings or mass lesions. Electronically Signed   By: Marijo Sanes  M.D.   On: 11/04/2017 17:09   Mr Brain Wo Contrast  Result Date:  11/05/2017 CLINICAL DATA:  Altered mental status, agitation and combative. On antibiotics for yeast infection. History of breast cancer, diabetes, hypertension, hyperlipidemia and stroke. EXAM: MRI HEAD WITHOUT CONTRAST TECHNIQUE: Multiplanar, multiecho pulse sequences of the brain and surrounding structures were obtained without intravenous contrast. COMPARISON:  CT HEAD November 04, 2017 and MRI of the head October 13, 2012. FINDINGS: Multiple sequences are mildly motion degraded. BRAIN: No reduced diffusion to suggest acute ischemia. No susceptibility artifact to suggest hemorrhage. Moderate parenchymal brain volume loss. No hydrocephalus. Similar FLAIR T2 hyperintensity posterior RIGHT insula. Patchy supratentorial white matter FLAIR T2 hyperintensities. Prominent basal ganglia perivascular spaces associated with chronic small vessel ischemic disease. No mass or mass effect. No abnormal extra-axial fluid collections. VASCULAR: Normal major intracranial vascular flow voids present at skull base. SKULL AND UPPER CERVICAL SPINE: No abnormal sellar expansion. No suspicious calvarial bone marrow signal. Craniocervical junction maintained. SINUSES/ORBITS: Moderate pan paranasal sinusitis. Status post probable FESS. The included ocular globes and orbital contents are non-suspicious. OTHER: Life-support lines in place. IMPRESSION: 1. No acute intracranial process on this mildly motion degraded examination. 2. Stable abnormal signal posterior RIGHT insula, differential diagnosis includes infarct, old infection, less likely low grade tumor. 3. Moderate parenchymal brain volume loss. 4. Mild chronic small vessel ischemic disease. Electronically Signed   By: Elon Alas M.D.   On: 11/05/2017 02:10   Dg Chest Port 1 View  Result Date: 11/05/2017 CLINICAL DATA:  Central line placement EXAM: PORTABLE CHEST 1 VIEW COMPARISON:  11/04/2017 FINDINGS:  Endotracheal tube tip is about 4.8 cm superior to carina. Esophageal tube tip projects over the gastric fundus. Right-sided central venous catheter tip superimposes the cavoatrial region. No pneumothorax. Patchy atelectasis at the bases. Stable cardiomediastinal silhouette. IMPRESSION: 1. Right-sided central venous catheter tip overlies the cavoatrial junction. No pneumothorax. 2. Patchy atelectasis at the bilateral lung bases. Electronically Signed   By: Donavan Foil M.D.   On: 11/05/2017 03:50   Dg Chest Portable 1 View  Result Date: 11/04/2017 CLINICAL DATA:  Status post intubation. EXAM: PORTABLE CHEST 1 VIEW COMPARISON:  11/04/2017 FINDINGS: The endotracheal tube is 4.7 cm above the carina. The NG tube is coursing down the esophagus and into the stomach. The cardiac silhouette, mediastinal and hilar contours are stable. There is tortuosity of the thoracic aorta. Mild mediastinal widening and mild fullness in the aorticopulmonary window may be accentuated by the supine position of the patient and the AP projection. No acute pulmonary findings. IMPRESSION: The endotracheal tube and NG tubes are in good position as above. Mild mediastinal widening likely due to the AP projection and supine position of the patient. A followup upright chest film may be helpful when able. Electronically Signed   By: Marijo Sanes M.D.   On: 11/04/2017 21:03   Dg Chest Portable 1 View  Result Date: 11/04/2017 CLINICAL DATA:  Altered mental status. EXAM: PORTABLE CHEST 1 VIEW COMPARISON:  11/29/2016. FINDINGS: Normal sized heart. Clear lungs. Stable mild prominence of the interstitial markings. Thoracic spine degenerative changes. IMPRESSION: No acute abnormality. Stable mild chronic interstitial lung disease. Electronically Signed   By: Claudie Revering M.D.   On: 11/04/2017 18:17     Assessment/Plan:  72 y.o. female  who presents with altered mental status.  Patient is unable to contribute information to her HPI given that  she is intubated and sedated.  Her husband states that she has had some malaise for the past couple of days.  Tonight he helped her to the bathroom and  back to her room.  She then proceeded to vomit and then had what sounds like a full on tonic-clonic seizure.  In the ambulance she is started to arouse and then became very agitated and confused.  She required heavy amounts of sedating medications here, eventually requiring intubation.  Patient's husband states that she has been complaining of a headache for the past couple of days.   Currently on Keppra Going to LP now under fluoro  Not sure if we have rapid biofire that we can see results of possible infection right after the LP For now con't antibiotics under biofire/cultures come back along with acyclovir EEG will be done today   11/06/2017, 12:21 PM

## 2017-11-06 NOTE — Consult Note (Signed)
Pharmacy Antibiotic Note  Yvette Tyler is a 72 y.o. female admitted on 11/04/2017 with acute encephalopathy and sepsis secondary to suspected meningitis. Pharmacy has been consulted for  Meropenem and vancomycin dosing. Patient has allergy to PCN (rash)- and can not be given ampicillin.   Plan: Ke: 0.047   T1/2: 14.74   Vd: 54.6  Start vancomycin 1250mg  IV every 18 hours with 6 hour stack dosing. Trough level ordered prior to 3rd dose. Calculated trough at Css is 19.5. Will monitor renal function and adjust dose as needed.   1/14 @ 1648: vancomycin trough= 11 mcg/ml.  Will increase vancomycin dosing to 1000 mg iv q 12 hours and f/u plans for abx. Although level may not yet be at steady-state, AKI has resolved and patient is r/o meningitis. Will plan on checking another level with the 4th or 5th dose depending on renal function and plans for abx.   Start meropenem 2g IV every 8 hours.    1/13 0300 Acyclovir 10 mg/kg q 8 hours added per CCM consult.  1/13 meropenem d/c. Ceftriaxone and ampicillin added per CCM. Aware of allergy and OK to proceed.  Height: 5\' 7"  (170.2 cm) Weight: 192 lb 0.3 oz (87.1 kg) IBW/kg (Calculated) : 61.6  Temp (24hrs), Avg:98 F (36.7 C), Min:97.8 F (36.6 C), Max:98.2 F (36.8 C)  Recent Labs  Lab 11/04/17 1700 11/04/17 2004 11/05/17 0254 11/05/17 0626 11/06/17 0431 11/06/17 0600 11/06/17 1709  WBC 16.5*  --  11.6*  --   --  13.2*  --   CREATININE 1.04*  --  0.90  --  0.74  --   --   LATICACIDVEN 3.5* 2.6*  --  1.9  --   --   --   VANCOTROUGH  --   --   --   --   --   --  11*    Estimated Creatinine Clearance: 71 mL/min (by C-G formula based on SCr of 0.74 mg/dL).    Allergies  Allergen Reactions  . Hydrocodone-Acetaminophen Other (See Comments)    Increased dream activity  . Meperidine Other (See Comments)    Rapid heart rate  . Penicillins Rash    Has patient had a PCN reaction causing immediate rash, facial/tongue/throat swelling, SOB  or lightheadedness with hypotension: Yes Has patient had a PCN reaction causing severe rash involving mucus membranes or skin necrosis: No Has patient had a PCN reaction that required hospitalization: No Has patient had a PCN reaction occurring within the last 10 years: No If all of the above answers are "NO", then may proceed with Cephalosporin use.     Antimicrobials this admission: 1/12 meropenem >>  1/12 vancomycin >>   Dose adjustments this admission:  Microbiology results:  Thank you for allowing pharmacy to be a part of this patient's care.  Napoleon Form, PharmD, BCPS Clinical Pharmacist 11/06/2017 6:37 PM

## 2017-11-06 NOTE — Progress Notes (Signed)
PULMONARY / CRITICAL CARE MEDICINE   Name: Yvette Tyler MRN: [id number] DOB: May 07, 1946    ADMISSION DATE:  11/04/2017  PT PROFILE:   20 F with hx of breast cancer, DM 2, depression adm via EMS/ED with 2-3 days of N/V, new onset of seizure (out of hospital) and subsequent combative delirium. Required intubation due to extreme combativeness. LP could not be successfully performed. Initially treated as possible meningitis vs encephalitis.   MAJOR EVENTS/TEST RESULTS: 01/12 Admission as above. Empiric abx and anti-convulsants initiated 01/12 CT head: No acute intracranial findings or mass lesions 01/13 Neurology Consultation:  01/13 MRI brain: No acute intracranial process on this mildly motion degraded Examination. 01/13 EEG:   INDWELLING DEVICES:: ETT 01/12 >>  R IJ CVL 01/12 >>   MICRO DATA: MRSA PCR 01/13 >> POS Resp virus panel 01/12 >> All NEG Urine 01/12 >>  Resp 01/13 >>  Blood 01/13 >>  CSF bact cx 01/14 >>  CSF virus 01/14 >>   ANTIMICROBIALS:  Acyclovir 01/13 >>  Vanc 01/13 >>  Ceftriaxone 01/13 >>  Ampicillin 01/13 >>      SUBJECTIVE:  Intubated, sedated.  RASS -3, intermittently agitated.  Not following commands.  VITAL SIGNS: BP (!) 126/55 (BP Location: Left Arm)   Pulse 86   Temp 97.9 F (36.6 C) (Axillary)   Resp 18   Ht 5\' 7"  (1.702 m)   Wt 87.1 kg (192 lb 0.3 oz)   SpO2 92%   BMI 30.07 kg/m   HEMODYNAMICS: CVP:  [8 mmHg-14 mmHg] 13 mmHg  VENTILATOR SETTINGS: Vent Mode: PRVC FiO2 (%):  [30 %] 30 % Set Rate:  [18 bmp] 18 bmp Vt Set:  [450 mL] 450 mL PEEP:  [5 cmH20] 5 cmH20 Plateau Pressure:  [14 cmH20-17 cmH20] 17 cmH20  INTAKE / OUTPUT: I/O last 3 completed shifts: In: 4034.4 [I.V.:3218.8; IV Piggyback:815.6] Out: 2670 [Urine:2670]  PHYSICAL EXAMINATION: General: Intubated, sedated, RASS -3, not following commands Neuro: Moves all extremities, EOMI, PERRL HEENT: NCAT, sclerae white Cardiovascular: Tachy, regular, no  M Lungs: Clear anteriorly Abdomen: Soft, + BS Ext: Extensive bruising on BUE, no LE edema, warm  LABS:  BMET Recent Labs  Lab 11/04/17 1700 11/05/17 0254 11/06/17 0431  NA 134* 139 139  K 3.5 4.0 2.7*  CL 97* 112* 104  CO2 23 18* 23  BUN 12 14 19   CREATININE 1.04* 0.90 0.74  GLUCOSE 571* 452* 418*    Electrolytes Recent Labs  Lab 11/04/17 1700 11/05/17 0254 11/06/17 0431  CALCIUM 9.1 7.9* 7.7*  MG  --  2.0 1.8  PHOS  --  3.6 2.8    CBC Recent Labs  Lab 11/04/17 1700 11/05/17 0254 11/06/17 0600  WBC 16.5* 11.6* 13.2*  HGB 15.6 13.4 12.2  HCT 46.9 40.3 36.3  PLT 235 203 170    Coag's Recent Labs  Lab 11/04/17 1700 11/05/17 0254  APTT  --  <24*  INR 1.01 1.03    Sepsis Markers Recent Labs  Lab 11/04/17 1700 11/04/17 2004 11/05/17 0254 11/05/17 0626 11/06/17 0431  LATICACIDVEN 3.5* 2.6*  --  1.9  --   PROCALCITON  --   --  0.26  --  0.16    ABG Recent Labs  Lab 11/04/17 2004 11/05/17 0310 11/05/17 0810  PHART 7.36 7.17* 7.35  PCO2ART 30* 43 47  PO2ART 86 92 90    Liver Enzymes Recent Labs  Lab 11/04/17 1700 11/05/17 0254  AST 38 27  ALT 27 23  ALKPHOS 113 84  BILITOT 1.1 1.2  ALBUMIN 3.8 3.1*    Cardiac Enzymes Recent Labs  Lab 11/05/17 0254 11/05/17 0900 11/05/17 1412  TROPONINI 0.03* <0.03 <0.03    Glucose Recent Labs  Lab 11/05/17 1405 11/05/17 1547 11/05/17 1819 11/05/17 2229 11/06/17 0709 11/06/17 1126  GLUCAP 91 166* 262* 303* 330* 309*    CXR: 01/13 NACPD    ASSESSMENT / PLAN:  NEUROLOGIC A:   Acute encephalopathy Possible new onset seizure P:   RASS goal: -2, -3 Change propofol to Precedex Continue fentanyl Continue Keppra Daily wakeup assessment Neurology following EEG results pending  PULMONARY A: Acute respiratory failure -intubated for AMS P:   Cont vent support - settings reviewed and/or adjusted PSV mode as tolerated Cont vent bundle Daily SBT if/when meets criteria    CARDIOVASCULAR A:  Sinus tachycardia, resolved Hypotension, resolved P:  Monitor rhythm and BP MAP goal >65 mmHg  RENAL A:   AKI, resolved Hypokalemia P:   Monitor BMET intermittently Monitor I/Os Correct electrolytes as indicated   GASTROINTESTINAL A:   No issues P:   SUP: Enteral famotidine Initiate TF protocol 01/14  HEMATOLOGIC A:   No issues P:  DVT px: Enoxaparin Monitor CBC intermittently Transfuse per usual guidelines   INFECTIOUS A:   Concern for meningitis versus encephalitis P:   Monitor temp, WBC count Micro and abx as above   ENDOCRINE A:   DM 2 Severe hyperglycemia P:   Discontinue dexamethasone Transition to Lantus plus SSI   FAMILY  - Updates: No family at bedside   CCM time: 45 mins  The above time includes time spent in consultation with patient and/or family members and reviewing care plan on multidisciplinary rounds  Merton Border, MD PCCM service Mobile [phone] Pager [phone] 11/06/2017, 1:29 PM

## 2017-11-06 NOTE — Progress Notes (Signed)
[facility] [place] NAME: Yvette Tyler    MR#:  [id number]  DATE OF BIRTH:  1946/06/10  SUBJECTIVE:  CHIEF COMPLAINT:  Pt is intubated, fluoroscopy guided spinal tap scheduled today  REVIEW OF SYSTEMS:  ROS unobtainable - sedated  DRUG ALLERGIES:   Allergies  Allergen Reactions  . Hydrocodone-Acetaminophen Other (See Comments)    Increased dream activity  . Meperidine Other (See Comments)    Rapid heart rate  . Penicillins Rash    Has patient had a PCN reaction causing immediate rash, facial/tongue/throat swelling, SOB or lightheadedness with hypotension: Yes Has patient had a PCN reaction causing severe rash involving mucus membranes or skin necrosis: No Has patient had a PCN reaction that required hospitalization: No Has patient had a PCN reaction occurring within the last 10 years: No If all of the above answers are "NO", then may proceed with Cephalosporin use.     VITALS:  Blood pressure 127/73, pulse 88, temperature (!) 96 F (35.6 C), temperature source Axillary, resp. rate 14, height 5\' 7"  (1.702 m), weight 87.1 kg (192 lb 0.3 oz), SpO2 92 %.  PHYSICAL EXAMINATION:  GENERAL:  72 y.o.-year-old patient lying in the bed with no acute distress.  EYES: Pupils equal, round, reactive to light and accommodation. No scleral icterus.  HEENT: Head atraumatic, normocephalic. Oropharynx with ET tube NECK:  Supple, no jugular venous distention. No thyroid enlargement, no tenderness.  LUNGS:Mod breath sounds bilaterally, no wheezing, rales,rhonchi or crepitation. No use of accessory muscles of respiration.  CARDIOVASCULAR: S1, S2 normal. No murmurs, rubs, or gallops.  ABDOMEN: Soft, nontender, nondistended. Bowel sounds present. No organomegaly or mass.  EXTREMITIES: No pedal edema, cyanosis, or clubbing.  NEUROLOGIC: intubated  PSYCHIATRIC: The patient is intubated SKIN: No obvious rash, lesion, or ulcer.    LABORATORY  PANEL:   CBC Recent Labs  Lab 11/06/17 0600  WBC 13.2*  HGB 12.2  HCT 36.3  PLT 170   ------------------------------------------------------------------------------------------------------------------  Chemistries  Recent Labs  Lab 11/05/17 0254 11/06/17 0431  NA 139 139  K 4.0 2.7*  CL 112* 104  CO2 18* 23  GLUCOSE 452* 418*  BUN 14 19  CREATININE 0.90 0.74  CALCIUM 7.9* 7.7*  MG 2.0 1.8  AST 27  --   ALT 23  --   ALKPHOS 84  --   BILITOT 1.2  --    ------------------------------------------------------------------------------------------------------------------  Cardiac Enzymes Recent Labs  Lab 11/05/17 1412  TROPONINI <0.03   ------------------------------------------------------------------------------------------------------------------  RADIOLOGY:  Mr Brain Wo Contrast  Result Date: 11/05/2017 CLINICAL DATA:  Altered mental status, agitation and combative. On antibiotics for yeast infection. History of breast cancer, diabetes, hypertension, hyperlipidemia and stroke. EXAM: MRI HEAD WITHOUT CONTRAST TECHNIQUE: Multiplanar, multiecho pulse sequences of the brain and surrounding structures were obtained without intravenous contrast. COMPARISON:  CT HEAD November 04, 2017 and MRI of the head October 13, 2012. FINDINGS: Multiple sequences are mildly motion degraded. BRAIN: No reduced diffusion to suggest acute ischemia. No susceptibility artifact to suggest hemorrhage. Moderate parenchymal brain volume loss. No hydrocephalus. Similar FLAIR T2 hyperintensity posterior RIGHT insula. Patchy supratentorial white matter FLAIR T2 hyperintensities. Prominent basal ganglia perivascular spaces associated with chronic small vessel ischemic disease. No mass or mass effect. No abnormal extra-axial fluid collections. VASCULAR: Normal major intracranial vascular flow voids present at skull base. SKULL AND UPPER CERVICAL SPINE: No abnormal sellar expansion. No suspicious calvarial bone  marrow signal. Craniocervical junction maintained. SINUSES/ORBITS: Moderate pan paranasal  sinusitis. Status post probable FESS. The included ocular globes and orbital contents are non-suspicious. OTHER: Life-support lines in place. IMPRESSION: 1. No acute intracranial process on this mildly motion degraded examination. 2. Stable abnormal signal posterior RIGHT insula, differential diagnosis includes infarct, old infection, less likely low grade tumor. 3. Moderate parenchymal brain volume loss. 4. Mild chronic small vessel ischemic disease. Electronically Signed   By: Elon Alas M.D.   On: 11/05/2017 02:10   Dg Chest Port 1 View  Result Date: 11/05/2017 CLINICAL DATA:  Central line placement EXAM: PORTABLE CHEST 1 VIEW COMPARISON:  11/04/2017 FINDINGS: Endotracheal tube tip is about 4.8 cm superior to carina. Esophageal tube tip projects over the gastric fundus. Right-sided central venous catheter tip superimposes the cavoatrial region. No pneumothorax. Patchy atelectasis at the bases. Stable cardiomediastinal silhouette. IMPRESSION: 1. Right-sided central venous catheter tip overlies the cavoatrial junction. No pneumothorax. 2. Patchy atelectasis at the bilateral lung bases. Electronically Signed   By: Donavan Foil M.D.   On: 11/05/2017 03:50   Dg Fluoro Guided Loc Of Needle/cath Tip For Spinal Inject Lt  Result Date: 11/06/2017 CLINICAL DATA:  Altered mental status EXAM: DIAGNOSTIC LUMBAR PUNCTURE UNDER FLUOROSCOPIC GUIDANCE FLUOROSCOPY TIME:  Fluoroscopy Time:  0.6 minute Radiation Exposure Index (if provided by the fluoroscopic device): 9.5 mGy Number of Acquired Spot Images: 0 PROCEDURE: Informed consent was obtained from the patient prior to the procedure, including potential complications of headache, allergy, and pain. With the patient prone, the lower back was prepped with Betadine. 1% Lidocaine was used for local anesthesia. Lumbar puncture was performed at the L4-5 level using a 22 gauge  needle with return of clear CSF. 12 ml of CSF were obtained for laboratory studies. The patient tolerated the procedure well and there were no apparent complications. IMPRESSION: Successful fluoroscopic guided lumbar puncture. Electronically Signed   By: Kathreen Devoid   On: 11/06/2017 12:42    EKG:   Orders placed or performed during the hospital encounter of 11/04/17  . ED EKG  . ED EKG  . EKG 12-Lead  . EKG 12-Lead    ASSESSMENT AND PLAN:   Sepsis ([place]) -suspicion for meningitis as her source of the Failed LP * 3 in ED,recommending LP by IR today was of encephalitis Rocephin, ampicillin, acyclovir, vanc  IV antibiotics , IV Decadron cultures ordered.   Lactic acid is elevated,  trend her lactate until within normal limits  IV fluids     Acute encephalopathy -due to meningitis versus encephalitis  Acute resp failure Intubated , vent management per intensivist    Seizure -ikely due to her encephalopathy loaded with IV Keppra, continue Keppra q 12 hrs MRI brain no acute findings    neurology consult appreciated  EEG today    HTN (hypertension) -stable, currently normotensive, can use as needed antihypertensives if her blood pressure rises, continue home meds when she is more alert and taking p.o.    Diabetes ([place]) -sliding scale insulin with corresponding glucose checks    HLD (hyperlipidemia) -resume home med  anti-lipids when she is more alert and taking p.o      All the records are reviewed and case discussed with Care Management/Social Workerr. Management plans discussed with the patient, family and they are in agreement.  CODE STATUS: fc   TOTAL TIME TAKING CARE OF THIS PATIENT: 35 minutes.   POSSIBLE D/C IN 3 DAYS, DEPENDING ON CLINICAL CONDITION.  Note: This dictation was prepared with Dragon dictation along with smaller phrase technology. Any  transcriptional errors that result from this process are unintentional.   Nicholes Mango M.D on 11/06/2017 at 10:23  PM  Between 7am to 6pm - Pager - [phone] After 6pm go to [email or web address] - password [email or web address] [facility]  [facility]  Office  [phone]  CC: Primary care physician; Glendon Axe, MD

## 2017-11-07 ENCOUNTER — Inpatient Hospital Stay: Payer: Medicare Other

## 2017-11-07 LAB — CBC
HEMATOCRIT: 33.6 % — AB (ref 35.0–47.0)
Hemoglobin: 11.5 g/dL — ABNORMAL LOW (ref 12.0–16.0)
MCH: 30.5 pg (ref 26.0–34.0)
MCHC: 34.1 g/dL (ref 32.0–36.0)
MCV: 89.6 fL (ref 80.0–100.0)
PLATELETS: 148 10*3/uL — AB (ref 150–440)
RBC: 3.75 MIL/uL — AB (ref 3.80–5.20)
RDW: 13.7 % (ref 11.5–14.5)
WBC: 12.7 10*3/uL — AB (ref 3.6–11.0)

## 2017-11-07 LAB — COMPREHENSIVE METABOLIC PANEL
ALK PHOS: 56 U/L (ref 38–126)
ALT: 22 U/L (ref 14–54)
AST: 22 U/L (ref 15–41)
Albumin: 2.4 g/dL — ABNORMAL LOW (ref 3.5–5.0)
Anion gap: 5 (ref 5–15)
BILIRUBIN TOTAL: 0.2 mg/dL — AB (ref 0.3–1.2)
BUN: 15 mg/dL (ref 6–20)
CO2: 27 mmol/L (ref 22–32)
CREATININE: 0.68 mg/dL (ref 0.44–1.00)
Calcium: 8.1 mg/dL — ABNORMAL LOW (ref 8.9–10.3)
Chloride: 115 mmol/L — ABNORMAL HIGH (ref 101–111)
Glucose, Bld: 169 mg/dL — ABNORMAL HIGH (ref 65–99)
Potassium: 3.9 mmol/L (ref 3.5–5.1)
Sodium: 147 mmol/L — ABNORMAL HIGH (ref 135–145)
Total Protein: 4.8 g/dL — ABNORMAL LOW (ref 6.5–8.1)

## 2017-11-07 LAB — BLOOD GAS, VENOUS
Acid-base deficit: 4.3 mmol/L — ABNORMAL HIGH (ref 0.0–2.0)
Bicarbonate: 23.3 mmol/L (ref 20.0–28.0)
PATIENT TEMPERATURE: 37
pCO2, Ven: 52 mmHg (ref 44.0–60.0)
pH, Ven: 7.26 (ref 7.250–7.430)

## 2017-11-07 LAB — GLUCOSE, CAPILLARY
GLUCOSE-CAPILLARY: 139 mg/dL — AB (ref 65–99)
GLUCOSE-CAPILLARY: 171 mg/dL — AB (ref 65–99)
GLUCOSE-CAPILLARY: 64 mg/dL — AB (ref 65–99)
Glucose-Capillary: 122 mg/dL — ABNORMAL HIGH (ref 65–99)
Glucose-Capillary: 135 mg/dL — ABNORMAL HIGH (ref 65–99)
Glucose-Capillary: 133 mg/dL — ABNORMAL HIGH (ref 65–99)

## 2017-11-07 LAB — PROCALCITONIN

## 2017-11-07 MED ORDER — ORAL CARE MOUTH RINSE
15.0000 mL | Freq: Two times a day (BID) | OROMUCOSAL | Status: DC
  Administered 2017-11-07 – 2017-11-13 (×12): 15 mL via OROMUCOSAL

## 2017-11-07 MED ORDER — DEXTROSE 50 % IV SOLN
INTRAVENOUS | Status: AC
  Administered 2017-11-07: 25 g via INTRAVENOUS
  Filled 2017-11-07: qty 50

## 2017-11-07 MED ORDER — NALOXONE HCL 0.4 MG/ML IJ SOLN
0.4000 mg | INTRAMUSCULAR | Status: DC | PRN
  Administered 2017-11-07: 0.4 mg via INTRAVENOUS

## 2017-11-07 MED ORDER — LORAZEPAM 2 MG/ML IJ SOLN: 1.0000 mg | INTRAMUSCULAR | Status: DC | PRN

## 2017-11-07 MED ORDER — METOPROLOL TARTRATE 5 MG/5ML IV SOLN
2.5000 mg | INTRAVENOUS | Status: DC | PRN
  Administered 2017-11-07 (×2): 5 mg via INTRAVENOUS
  Administered 2017-11-07: 2.5 mg via INTRAVENOUS
  Filled 2017-11-07 (×2): qty 5

## 2017-11-07 MED ORDER — METOPROLOL TARTRATE 5 MG/5ML IV SOLN
INTRAVENOUS | Status: AC
  Administered 2017-11-07: 5 mg via INTRAVENOUS
  Filled 2017-11-07: qty 5

## 2017-11-07 MED ORDER — HYDRALAZINE HCL 20 MG/ML IJ SOLN
10.0000 mg | INTRAMUSCULAR | Status: DC | PRN
  Administered 2017-11-07 (×2): 20 mg via INTRAVENOUS
  Filled 2017-11-07: qty 1
  Filled 2017-11-07: qty 2
  Filled 2017-11-07: qty 1

## 2017-11-07 MED ORDER — LABETALOL HCL 5 MG/ML IV SOLN
10.0000 mg | Freq: Once | INTRAVENOUS | Status: AC
  Administered 2017-11-07: 10 mg via INTRAVENOUS
  Filled 2017-11-07: qty 4

## 2017-11-07 MED ORDER — INSULIN GLARGINE 100 UNIT/ML ~~LOC~~ SOLN
15.0000 [IU] | Freq: Every day | SUBCUTANEOUS | Status: DC
  Administered 2017-11-07 – 2017-11-09 (×3): 15 [IU] via SUBCUTANEOUS
  Filled 2017-11-07 (×4): qty 0.15

## 2017-11-07 MED ORDER — DEXTROSE 50 % IV SOLN
25.0000 g | Freq: Once | INTRAVENOUS | Status: AC
Start: 2017-11-07 — End: 2017-11-07
  Administered 2017-11-07: 25 g via INTRAVENOUS

## 2017-11-07 MED ORDER — NALOXONE HCL 0.4 MG/ML IJ SOLN
INTRAMUSCULAR | Status: AC
  Administered 2017-11-07: 0.4 mg via INTRAVENOUS
  Filled 2017-11-07: qty 1

## 2017-11-07 MED ORDER — INSULIN GLARGINE 100 UNIT/ML ~~LOC~~ SOLN: 20.0000 [IU] | Freq: Every day | SUBCUTANEOUS | Status: DC

## 2017-11-07 MED ORDER — POTASSIUM CHLORIDE 2 MEQ/ML IV SOLN
INTRAVENOUS | Status: DC
  Administered 2017-11-07 – 2017-11-08 (×2): via INTRAVENOUS
  Filled 2017-11-07 (×5): qty 1000

## 2017-11-07 MED ORDER — LORAZEPAM 2 MG/ML IJ SOLN
1.0000 mg | Freq: Once | INTRAMUSCULAR | Status: AC
  Administered 2017-11-07: 1 mg via INTRAVENOUS
  Filled 2017-11-07: qty 1

## 2017-11-07 NOTE — Progress Notes (Signed)
Spoke with Hubert Azure, RN with infection prevention regarding isolation precautions.  Droplet precautions discontinued and contact precautions continued.  Will continue to assess and monitor.

## 2017-11-07 NOTE — Plan of Care (Signed)
  Respiratory: Ability to maintain a clear airway and adequate ventilation will improve 11/07/2017 1125 - Completed/Met by Farris Has D, RN   Patient extubated to [facility] at 0930 by RT.  O2 decreased to [facility] per MD order at 1015. Patient's O2 sats in low-mid 90s.

## 2017-11-07 NOTE — Progress Notes (Signed)
PULMONARY / CRITICAL CARE MEDICINE   Name: Yvette Tyler MRN: [id number] DOB: Oct 23, 1946    ADMISSION DATE:  11/04/2017  PT PROFILE:   43 F with hx of breast cancer, DM 2, depression adm via EMS/ED with 2-3 days of N/V, new onset of seizure (out of hospital) and subsequent combative delirium. Required intubation due to extreme combativeness. LP could not be successfully performed. Initially treated as possible meningitis vs encephalitis.   MAJOR EVENTS/TEST RESULTS: 01/12 Admission as above. Empiric abx and anti-convulsants initiated 01/12 CT head: No acute intracranial findings or mass lesions 01/13 Neurology Consultation:  01/13 MRI brain: No acute intracranial process on this mildly motion degraded examination. 01/14 EEG:  01/15 Passed SBT. + F/C intermittently. Extubated. Periodic breathing pattern improved after naloxone   INDWELLING DEVICES:: ETT 01/12 >> 01/15 R IJ CVL 01/12 >>   MICRO DATA: Influenza PCR 01/12 >> NEG MRSA PCR 01/13 >> POS Resp virus panel 01/12 >> All NEG Urine 01/12 >> NEG Resp 01/13 >>  Blood 01/13 >>  CSF bact cx 01/14 >>  CSF virus 01/14 >>  CSF HSV PCR 01/14 >>   PCT 01/13: 0.26, 0.16, < 0.10  ANTIMICROBIALS:  Vanc 01/13 >> 01/15 Ceftriaxone 01/13 >> 01/15 Ampicillin 01/13 >> 01/15 Acyclovir 01/13 >>     SUBJECTIVE:  Passed SBT. + F/C intermittently. Extubated. Periodic breathing pattern improved after naloxone  VITAL SIGNS: BP (!) 166/90   Pulse (!) 107   Temp 98.6 F (37 C) (Oral)   Resp 20   Ht 5\' 7"  (1.702 m)   Wt 89.1 kg (196 lb 6.9 oz)   SpO2 91%   BMI 30.77 kg/m   HEMODYNAMICS:    VENTILATOR SETTINGS: Vent Mode: PRVC FiO2 (%):  [35 %] 35 % Set Rate:  [14 bmp] 14 bmp Vt Set:  [450 mL] 450 mL PEEP:  [5 cmH20] 5 cmH20 Plateau Pressure:  [13 cmH20-17 cmH20] 14 cmH20  INTAKE / OUTPUT: I/O last 3 completed shifts: In: 2682.3 [I.V.:2682.3] Out: 1027 [Urine:1730]  PHYSICAL EXAMINATION: General: RASS 0,  intermittently following commands Neuro: Moves all extremities, EOMI, PERRL HEENT: NCAT, sclerae white Cardiovascular: Tachy, regular, no M Lungs: Clear anteriorly Abdomen: Soft, + BS Ext: Extensive bruising on BUE, no LE edema, warm  LABS:  BMET Recent Labs  Lab 11/05/17 0254 11/06/17 0431 11/07/17 0415  NA 139 139 147*  K 4.0 2.7* 3.9  CL 112* 104 115*  CO2 18* 23 27  BUN 14 19 15   CREATININE 0.90 0.74 0.68  GLUCOSE 452* 418* 169*    Electrolytes Recent Labs  Lab 11/05/17 0254 11/06/17 0431 11/07/17 0415  CALCIUM 7.9* 7.7* 8.1*  MG 2.0 1.8  --   PHOS 3.6 2.8  --     CBC Recent Labs  Lab 11/05/17 0254 11/06/17 0600 11/07/17 0415  WBC 11.6* 13.2* 12.7*  HGB 13.4 12.2 11.5*  HCT 40.3 36.3 33.6*  PLT 203 170 148*    Coag's Recent Labs  Lab 11/04/17 1700 11/05/17 0254  APTT  --  <24*  INR 1.01 1.03    Sepsis Markers Recent Labs  Lab 11/04/17 1700 11/04/17 2004 11/05/17 0254 11/05/17 0626 11/06/17 0431 11/07/17 0415  LATICACIDVEN 3.5* 2.6*  --  1.9  --   --   PROCALCITON  --   --  0.26  --  0.16 <0.10    ABG Recent Labs  Lab 11/04/17 2004 11/05/17 0310 11/05/17 0810  PHART 7.36 7.17* 7.35  PCO2ART 30* 43 47  PO2ART 86 92 90    Liver Enzymes Recent Labs  Lab 11/04/17 1700 11/05/17 0254 11/07/17 0415  AST 38 27 22  ALT 27 23 22   ALKPHOS 113 84 56  BILITOT 1.1 1.2 0.2*  ALBUMIN 3.8 3.1* 2.4*    Cardiac Enzymes Recent Labs  Lab 11/05/17 0254 11/05/17 0900 11/05/17 1412  TROPONINI 0.03* <0.03 <0.03    Glucose Recent Labs  Lab 11/06/17 1723 11/06/17 1954 11/06/17 2313 11/07/17 0316 11/07/17 0741 11/07/17 1110  GLUCAP 244* 229* 254* 139* 171* 122*    CXR: R>L effusions     ASSESSMENT / PLAN:  NEUROLOGIC A:   Acute encephalopathy Possible new onset seizure Lingering opioid effect P:   DC allsedatives Continue Keppra for now PRN naloxone Neurology following EEG results pending  PULMONARY A: Acute  respiratory failure -intubated for AMS Small bilateral pleural effusions  Opioid induced respiratory suppression  P:   Monitor in ICU post extubation Supplemental O2 as needed PRN naloxone  CARDIOVASCULAR A:  Sinus tachycardia, recurrent Hypotension, resolved Hypertension P:  PRN metoprolol to maintain HR < 115/min PRN hydralazine to maintain SBP < 160 mmHg  RENAL A:   AKI, resolved Hypokalemia Hypernatremia P:   Monitor BMET intermittently Monitor I/Os Correct electrolytes as indicated  IVFs ordered while NPO  GASTROINTESTINAL A:   No issues P:   SUP: EN/I post extubation NPO post extubation Consider SLP 01/16  HEMATOLOGIC A:   Very mild thrombocytopenia  P:  DVT px: Enoxaparin Monitor CBC intermittently Transfuse per usual guidelines   INFECTIOUS A:   This is not meningitis Still concern for possible encephalitis P:   Monitor temp, WBC count Micro and abx as above   ENDOCRINE A:   DM 2, controlled Severe hyperglycemia, resolved P:   Cont Lantus plus SSI Lantus dose decreased while NPO   FAMILY  - Updates: No family at bedside   CCM time: 45 mins  The above time includes time spent in consultation with patient and/or family members and reviewing care plan on multidisciplinary rounds  Merton Border, MD PCCM service Mobile [phone] Pager [phone] 11/07/2017, 1:32 PM

## 2017-11-07 NOTE — Progress Notes (Signed)
Pt temperature 98.2 at 0300. Bear hugger turned off. Will leave bear hugger on standby in room. Marland Kitchen

## 2017-11-07 NOTE — Progress Notes (Signed)
Patient is placed on high fowlers position, cuff deflated, suctioned orally and endotracheally and then extubated to 4 lpm O2 [place]

## 2017-11-07 NOTE — Progress Notes (Signed)
[facility] NAME: Yvette Tyler    MR#:  [id number]  DATE OF BIRTH:  1946/06/17  SUBJECTIVE:  CHIEF COMPLAINT:  Pt is extubated, fluoroscopy guided spinal tap done 1/14  REVIEW OF SYSTEMS:  ROS unobtainable - lethargic  DRUG ALLERGIES:   Allergies  Allergen Reactions  . Hydrocodone-Acetaminophen Other (See Comments)    Increased dream activity  . Meperidine Other (See Comments)    Rapid heart rate  . Penicillins Rash    Has patient had a PCN reaction causing immediate rash, facial/tongue/throat swelling, SOB or lightheadedness with hypotension: Yes Has patient had a PCN reaction causing severe rash involving mucus membranes or skin necrosis: No Has patient had a PCN reaction that required hospitalization: No Has patient had a PCN reaction occurring within the last 10 years: No If all of the above answers are "NO", then may proceed with Cephalosporin use.     VITALS:  Blood pressure (!) 161/86, pulse (!) 135, temperature 98.6 F (37 C), temperature source Oral, resp. rate 16, height 5\' 7"  (1.702 m), weight 89.1 kg (196 lb 6.9 oz), SpO2 92 %.  PHYSICAL EXAMINATION:  GENERAL:  72 y.o.-year-old patient lying in the bed with no acute distress.  EYES: Pupils equal, round, reactive to light and accommodation. No scleral icterus.  HEENT: Head atraumatic, normocephalic. Oropharynx clear NECK:  Supple, no jugular venous distention. No thyroid enlargement, no tenderness.  LUNGS:Mod breath sounds bilaterally, no wheezing, rales,rhonchi or crepitation. No use of accessory muscles of respiration.  CARDIOVASCULAR: S1, S2 normal. No murmurs, rubs, or gallops.  ABDOMEN: Soft, nontender, nondistended. Bowel sounds present. No organomegaly or mass.  EXTREMITIES: No pedal edema, cyanosis, or clubbing.  NEUROLOGIC: intubated  PSYCHIATRIC: The patient is intubated SKIN: No obvious rash, lesion, or ulcer.    LABORATORY PANEL:    CBC Recent Labs  Lab 11/07/17 0415  WBC 12.7*  HGB 11.5*  HCT 33.6*  PLT 148*   ------------------------------------------------------------------------------------------------------------------  Chemistries  Recent Labs  Lab 11/06/17 0431 11/07/17 0415  NA 139 147*  K 2.7* 3.9  CL 104 115*  CO2 23 27  GLUCOSE 418* 169*  BUN 19 15  CREATININE 0.74 0.68  CALCIUM 7.7* 8.1*  MG 1.8  --   AST  --  22  ALT  --  22  ALKPHOS  --  56  BILITOT  --  0.2*   ------------------------------------------------------------------------------------------------------------------  Cardiac Enzymes Recent Labs  Lab 11/05/17 1412  TROPONINI <0.03   ------------------------------------------------------------------------------------------------------------------  RADIOLOGY:  Dg Chest Port 1 View  Result Date: 11/07/2017 CLINICAL DATA:  73 year old female with respiratory failure. Subsequent encounter. EXAM: PORTABLE CHEST 1 VIEW COMPARISON:  11/05/2017 chest x-ray. FINDINGS: Endotracheal tube tip 4 cm above the carina. Right central line tip proximal to mid superior vena cava level. Nasogastric tube courses below the diaphragm. Tip is not included on the present exam. Interval development of bilateral pleural effusions with basilar consolidation which may represent atelectasis or infiltrates. Pulmonary vascular congestion. Mild cardiomegaly. No pneumothorax. No acute osseous abnormality. IMPRESSION: Interval development of bilateral pleural effusions with basilar consolidation which may represent atelectasis or infiltrates. Pulmonary vascular congestion. Mild cardiomegaly. Electronically Signed   By: Genia Del M.D.   On: 11/07/2017 06:28   Dg Fluoro Guided Loc Of Needle/cath Tip For Spinal Inject Lt  Result Date: 11/06/2017 CLINICAL DATA:  Altered mental status EXAM: DIAGNOSTIC LUMBAR PUNCTURE UNDER FLUOROSCOPIC GUIDANCE FLUOROSCOPY TIME:  Fluoroscopy Time:  0.6 minute Radiation  Exposure Index (if provided by the fluoroscopic device): 9.5 mGy Number of Acquired Spot Images: 0 PROCEDURE: Informed consent was obtained from the patient prior to the procedure, including potential complications of headache, allergy, and pain. With the patient prone, the lower back was prepped with Betadine. 1% Lidocaine was used for local anesthesia. Lumbar puncture was performed at the L4-5 level using a 22 gauge needle with return of clear CSF. 12 ml of CSF were obtained for laboratory studies. The patient tolerated the procedure well and there were no apparent complications. IMPRESSION: Successful fluoroscopic guided lumbar puncture. Electronically Signed   By: Kathreen Devoid   On: 11/06/2017 12:42    EKG:   Orders placed or performed during the hospital encounter of 11/04/17  . ED EKG  . ED EKG  . EKG 12-Lead  . EKG 12-Lead  . EKG 12-Lead  . EKG 12-Lead    ASSESSMENT AND PLAN:   Sepsis ([place]) -suspicion for meningitis as her source of the Failed LP * 3 in ED,had LP by IR  1/14 , WBC in CSF -2 Concerning for encephalitis Antibiotics Rocephin, ampicillin, vanc  discontinued by intensivist Continue acyclovir,  IV Decadron initially given which was discontinued eventually cultures ordered.   Lactic acid is elevated,  trend her lactate until within normal limits  IV fluids     Acute encephalopathy -due to meningitis versus encephalitis  AK I resolved with IV fluids  Acute resp failure SBT trials and extubated    Seizure -ikely due to her encephalopathy loaded with IV Keppra, continue Keppra q 12 hrs MRI brain no acute findings    neurology consult appreciated  EEG today    HTN (hypertension) -stable, currently normotensive, can use as needed antihypertensives if her blood pressure rises, continue home meds when she is more alert and taking p.o.    Diabetes ([place]) -sliding scale insulin with corresponding glucose checks    HLD (hyperlipidemia) -resume home med  anti-lipids  when she is more alert and taking p.o      All the records are reviewed and case discussed with Care Management/Social Workerr. Management plans discussed with the patient, family and they are in agreement.  CODE STATUS: fc   TOTAL TIME TAKING CARE OF THIS PATIENT: 35 minutes.   POSSIBLE D/C IN 3 DAYS, DEPENDING ON CLINICAL CONDITION.  Note: This dictation was prepared with Dragon dictation along with smaller phrase technology. Any transcriptional errors that result from this process are unintentional.   Nicholes Mango M.D on 11/07/2017 at 4:25 PM  Between 7am to 6pm - Pager - [phone] After 6pm go to [email or web address] - password [email or web address] [facility]  Office  [phone]  CC: Primary care physician; Glendon Axe, MD

## 2017-11-07 NOTE — Progress Notes (Signed)
Inpatient Diabetes Program Recommendations  AACE/ADA: New Consensus Statement on Inpatient Glycemic Control (2015)  Target Ranges:  Prepandial:   less than 140 mg/dL      Peak postprandial:   less than 180 mg/dL (1-2 hours)      Critically ill patients:  140 - 180 mg/dL   Lab Results  Component Value Date   GLUCAP 122 (H) 11/07/2017   HGBA1C 11.1 (H) 10/13/2012    Review of Glycemic Control Results for Yvette Tyler, Yvette Tyler" (MRN [id number]) as of 11/07/2017 11:53  Ref. Range 11/07/2017 03:16 11/07/2017 03:17 11/07/2017 04:15 11/07/2017 07:41 11/07/2017 11:10  Glucose-Capillary Latest Ref Range: 65 - 99 mg/dL 139 (H)   171 (H) 122 (H)   Diabetes history: Type 2 DM Outpatient Diabetes medications: Amaryl 4 mg BID, per outpatient office visit note on 04/2017, will need to verify Current orders for Inpatient glycemic control: Lantus 30 Units HS, Novolog 0-15 Units Q4H  Inpatient Diabetes Program Recommendations:    Last A1C was 11.1%  back in 2013, consider repeating A1C (add to labs last drawn).   Noted decrease to Lantus to 15 Units QHS, and DC of Decadron and tube feeds. Progressing on oxygen post extubation. No additional recs today.   Thanks, Bronson Curb, MSN, RNC-OB Diabetes Coordinator [phone] (8a-5p)

## 2017-11-08 LAB — PHOSPHORUS
PHOSPHORUS: 1.6 mg/dL — AB (ref 2.5–4.6)
PHOSPHORUS: 2 mg/dL — AB (ref 2.5–4.6)

## 2017-11-08 LAB — BASIC METABOLIC PANEL
Anion gap: 7 (ref 5–15)
BUN: 10 mg/dL (ref 6–20)
CO2: 31 mmol/L (ref 22–32)
CREATININE: 0.6 mg/dL (ref 0.44–1.00)
Calcium: 8.5 mg/dL — ABNORMAL LOW (ref 8.9–10.3)
Chloride: 104 mmol/L (ref 101–111)
GFR calc Af Amer: >60 mL/min (ref >60)
Glucose, Bld: 90 mg/dL (ref 65–99)
POTASSIUM: 3.4 mmol/L — AB (ref 3.5–5.1)
SODIUM: 142 mmol/L (ref 135–145)

## 2017-11-08 LAB — CBC
HCT: 38.9 % (ref 35.0–47.0)
Hemoglobin: 13.1 g/dL (ref 12.0–16.0)
MCH: 30.4 pg (ref 26.0–34.0)
MCHC: 33.6 g/dL (ref 32.0–36.0)
MCV: 90.4 fL (ref 80.0–100.0)
PLATELETS: 153 10*3/uL (ref 150–440)
RBC: 4.3 MIL/uL (ref 3.80–5.20)
RDW: 14 % (ref 11.5–14.5)
WBC: 12.4 10*3/uL — AB (ref 3.6–11.0)

## 2017-11-08 LAB — CULTURE, RESPIRATORY W GRAM STAIN

## 2017-11-08 LAB — HERPES SIMPLEX VIRUS(HSV) DNA BY PCR
HSV 1 DNA: NEGATIVE
HSV 2 DNA: NEGATIVE

## 2017-11-08 LAB — MAGNESIUM
MAGNESIUM: 1.8 mg/dL (ref 1.7–2.4)
Magnesium: 1.7 mg/dL (ref 1.7–2.4)

## 2017-11-08 LAB — GLUCOSE, CAPILLARY
GLUCOSE-CAPILLARY: 125 mg/dL — AB (ref 65–99)
Glucose-Capillary: 94 mg/dL (ref 65–99)
Glucose-Capillary: 107 mg/dL — ABNORMAL HIGH (ref 65–99)
Glucose-Capillary: 115 mg/dL — ABNORMAL HIGH (ref 65–99)
Glucose-Capillary: 168 mg/dL — ABNORMAL HIGH (ref 65–99)
Glucose-Capillary: 145 mg/dL — ABNORMAL HIGH (ref 65–99)

## 2017-11-08 LAB — POTASSIUM: POTASSIUM: 3.7 mmol/L (ref 3.5–5.1)

## 2017-11-08 LAB — CULTURE, RESPIRATORY: CULTURE: NORMAL

## 2017-11-08 MED ORDER — LABETALOL HCL 5 MG/ML IV SOLN
10.0000 mg | INTRAVENOUS | Status: DC | PRN
  Administered 2017-11-08: 10 mg via INTRAVENOUS
  Administered 2017-11-08: 20 mg via INTRAVENOUS
  Filled 2017-11-08 (×2): qty 4

## 2017-11-08 MED ORDER — IPRATROPIUM-ALBUTEROL 0.5-2.5 (3) MG/3ML IN SOLN: 3.0000 mL | RESPIRATORY_TRACT | Status: DC | PRN

## 2017-11-08 MED ORDER — SODIUM CHLORIDE 0.9 % IV SOLN
500.0000 mg | Freq: Two times a day (BID) | INTRAVENOUS | Status: DC
  Administered 2017-11-08 – 2017-11-09 (×2): 500 mg via INTRAVENOUS
  Filled 2017-11-08 (×3): qty 5

## 2017-11-08 MED ORDER — HYDRALAZINE HCL 20 MG/ML IJ SOLN: 10.0000 mg | INTRAMUSCULAR | Status: DC | PRN

## 2017-11-08 MED ORDER — METOPROLOL TARTRATE 5 MG/5ML IV SOLN: 2.5000 mg | INTRAVENOUS | Status: DC | PRN

## 2017-11-08 MED ORDER — CLONIDINE HCL 0.1 MG/24HR TD PTWK
0.1000 mg | MEDICATED_PATCH | TRANSDERMAL | Status: DC
  Administered 2017-11-08: 0.1 mg via TRANSDERMAL
  Filled 2017-11-08: qty 1

## 2017-11-08 MED ORDER — PNEUMOCOCCAL VAC POLYVALENT 25 MCG/0.5ML IJ INJ
0.5000 mL | INJECTION | INTRAMUSCULAR | Status: AC
Start: 2017-11-09 — End: 2017-11-09
  Administered 2017-11-09: 0.5 mL via INTRAMUSCULAR
  Filled 2017-11-08: qty 0.5

## 2017-11-08 MED ORDER — INSULIN ASPART 100 UNIT/ML ~~LOC~~ SOLN
0.0000 [IU] | Freq: Every day | SUBCUTANEOUS | Status: DC
  Administered 2017-11-10 – 2017-11-11 (×2): 3 [IU] via SUBCUTANEOUS
  Filled 2017-11-08 (×2): qty 1

## 2017-11-08 MED ORDER — ENSURE ENLIVE PO LIQD
237.0000 mL | Freq: Three times a day (TID) | ORAL | Status: DC
  Administered 2017-11-09 – 2017-11-13 (×13): 237 mL via ORAL

## 2017-11-08 MED ORDER — INSULIN ASPART 100 UNIT/ML ~~LOC~~ SOLN
0.0000 [IU] | Freq: Three times a day (TID) | SUBCUTANEOUS | Status: DC
  Administered 2017-11-08 – 2017-11-09 (×4): 3 [IU] via SUBCUTANEOUS
  Administered 2017-11-10: 11 [IU] via SUBCUTANEOUS
  Administered 2017-11-11: 5 [IU] via SUBCUTANEOUS
  Administered 2017-11-12: 8 [IU] via SUBCUTANEOUS
  Administered 2017-11-12: 3 [IU] via SUBCUTANEOUS
  Administered 2017-11-13: 2 [IU] via SUBCUTANEOUS
  Administered 2017-11-13: 8 [IU] via SUBCUTANEOUS
  Administered 2017-11-13: 3 [IU] via SUBCUTANEOUS
  Filled 2017-11-08 (×11): qty 1

## 2017-11-08 MED ORDER — INFLUENZA VAC SPLIT HIGH-DOSE 0.5 ML IM SUSY
0.5000 mL | PREFILLED_SYRINGE | INTRAMUSCULAR | Status: AC
  Administered 2017-11-09: 0.5 mL via INTRAMUSCULAR
  Filled 2017-11-08: qty 0.5

## 2017-11-08 NOTE — Clinical Social Work Note (Signed)
Clinical Social Work Assessment  Patient Details  Name: Yvette Tyler MRN: [id number] Date of Birth: 11/15/1945  Date of referral:  11/08/17               Reason for consult:  Discharge Planning                Permission sought to share information with:  Family Supports Permission granted to share information::  Yes, Verbal Permission Granted  Name::        Agency::     Relationship::     Contact Information:     Housing/Transportation Living arrangements for the past 2 months:  Single Family Home Source of Information:  Patient, Adult Children Patient Interpreter Needed:  None Criminal Activity/Legal Involvement Pertinent to Current Situation/Hospitalization:  No - Comment as needed Significant Relationships:  Adult Children, Spouse Lives with:  Spouse Do you feel safe going back to the place where you live?  Yes Need for family participation in patient care:  No (Coment)  Care giving concerns:  Patient resides at home with her husband.    Social Worker assessment / plan:  CSW met with patient and her son: Yvette Tyler ([phone]) in patient's room this afternoon. Patient's husband had returned home. CSW was able to meet with patient's nurse this afternoon who placed consult yesterday so she was able to explain the consult reason in detail.   CSW introduced self and explained role and reason for visit. Patient was not feeling well and stated she was a little nauseated. CSW asked if I could speak with her son for a few minutes and she gave permission. Patient's son explained that he has had concerns about his parents' home for quite some time. He stated that he has contacted DSS APS multiple times but continues to be told the same thing by DSS and that is that they are able to make their own decisions as to where they wish to live and that they are both competent adults. Patient's son explained that there are no rodents, animals, or bugs in the home. He stated that there is no  feces or urine in the home. He did state that he believes there is black mold growing on the walls, He stated his parents are hoarders and the ceiling is falling apart and leaking. He stated that his father will not seek medical assistance for himself and that his mother just stays in the house. CSW provided supportive counseling and stated that CSW had a discussion with the patient's nurse to request a PT consult when appropriate as patient may qualify for STR. CSW stated that this might be a short term solution unless she is willing to potentially stay long term in a facility. Patient's son expressed understanding. He stated that he gave his parents $14,000 that they squandered away and that he sends them $300 per month for food. He stated that they won't consider moving even though he has stated they could come live with him.  Patient's son was noticeably not feeling well and told CSW that he was feeling dizzy, having difficulty finding his words and has been having diarrhea. CSW advised that he should probably not be visiting his mom and that he should go to urgent care if he is having those symptoms. He agreed and stated he would go to urgent care before they closed. He provided CSW with his contact information and we agreed to touch base tomorrow.  Employment status:  Retired Insurance information:  Medicare   PT Recommendations:  Not assessed at this time Information / Referral to community resources:     Patient/Family's Response to care:  Patient's son expressed appreciation for CSW assistance.  Patient/Family's Understanding of and Emotional Response to Diagnosis, Current Treatment, and Prognosis:  Patient's son feels very helpless with his parents at this time and continues to try and pursue their well-being.  Emotional Assessment Appearance:  Appears stated age Attitude/Demeanor/Rapport:  (pleasant) Affect (typically observed):  Calm Orientation:  Oriented to Self, Oriented to  Place Alcohol / Substance use:  Not Applicable Psych involvement (Current and /or in the community):  No (Comment)  Discharge Needs  Concerns to be addressed:  Care Coordination Readmission within the last 30 days:  No Current discharge risk:  None Barriers to Discharge:  No Barriers Identified    , LCSW 11/08/2017, 4:33 PM  

## 2017-11-08 NOTE — Progress Notes (Signed)
Nutrition Follow-up  DOCUMENTATION CODES:   Not applicable  INTERVENTION:  Provide Ensure Enlive po TID, each supplement provides 350 kcal and 20 grams of protein.  Recommend replacing phosphorus.  NUTRITION DIAGNOSIS:   Inadequate oral intake related to decreased appetite as evidenced by per patient/family report.  New nutrition diagnosis.  GOAL:   Patient will meet greater than or equal to 90% of their needs  Progressing.  MONITOR:   PO intake, Supplement acceptance, Labs, Weight trends, Skin, I & O's  REASON FOR ASSESSMENT:   Ventilator    ASSESSMENT:   72 year old female with PMHx of anxiety, depression, DM type 2, hx CVA, HLD, HTN, OA, hx breast cancer s/p partial left mastectomy 12/08/2015 who presents with AMS after a few day history of malaise, headache, N/V, and suffered what is thought to be tonic-clonic seizure at home. Patient was intubated 11/04/2017. Patient has sepsis, acute encephalopathy with severe agitation, concern for meningitis.  -Patient was extubated on 1/15. Tube feeds stopped and OGT removed. -Patient was started on heart healthy/carbohydrate modified today.  Met with patient at bedside. She was alert enough to answer some questions, but is somewhat confused. She reports she does not have a good appetite, but may try to eat some lunch later. PTA she had a good appetite and ate well. She reports she was weight stable PTA.  Medications reviewed and include: Novolog 0-15 units TID, Novolog 0-5 units QHS, Lantus 15 units QHS, acyclovir, D5W with KCl 40 mEq/L at 50 mL/hr (60 grams dextrose, 204 kcal daily), Keppra.  Labs reviewed: CBG 64-133, Potassium 3.4, Phosphorus 1.6.  I/O: 2375 mL UOP (1.1 mL/kg/hr) yesterday  Weight trend: 87.4 kg 1/16; +9.4 kg from admission  Discussed with RN.  Diet Order:  Diet heart healthy/carb modified Room service appropriate? Yes; Fluid consistency: Thin  EDUCATION NEEDS:   No education needs have been  identified at this time  Skin:  Skin Assessment: Skin Integrity Issues: Skin Integrity Issues:: Other (Comment) Other: ecchymosis bilateral arms; skin tear distal right arm; ITD to groin and breast  Last BM:  Unknown  Height:   Ht Readings from Last 1 Encounters:  11/04/17 _0  (1.702 m)    Weight:   Wt Readings from Last 1 Encounters:  11/08/17 192 lb 10.9 oz (87.4 kg)    Ideal Body Weight:  61.4 kg  BMI:  Body mass index is 30.18 kg/m.  Estimated Nutritional Needs:   Kcal:  1585-1850 (MSJ x 1.2-1.4)  Protein:  94-110 grams (1.2-1.4 grams/kg)  Fluid:  1.9 L/day (25 mL/kg)  Willey Blade, MS, RD, LDN Office: [phone] Pager: [phone] After Hours/Weekend Pager: [phone]

## 2017-11-08 NOTE — Evaluation (Signed)
Clinical/Bedside Swallow Evaluation Patient Details  Name: Yvette Tyler MRN: [id number] Date of Birth: 1946/08/18  Today's Date: 11/08/2017 Time: SLP Start Time (ACUTE ONLY): 96 SLP Stop Time (ACUTE ONLY): 1330 SLP Time Calculation (min) (ACUTE ONLY): 60 min  Past Medical History:  Past Medical History:  Diagnosis Date  . Anxiety   . Cancer ([place])   . Depression   . Diabetes mellitus without complication ([place])   . DVT (deep vein thrombosis) in pregnancy ([place])   . Hyperlipidemia   . Hypertension   . Insomnia   . Osteoarthritis   . Rectal bleeding   . Stroke ([place])   . Suicidal ideation    Past Surgical History:  Past Surgical History:  Procedure Laterality Date  . AXILLARY LYMPH NODE DISSECTION Left 12/08/2015   Procedure: AXILLARY LYMPH NODE DISSECTION;  Surgeon: Leonie Green, MD;  Location: [facility] ORS;  Service: General;  Laterality: Left;  POSSIBLE AXILLARY NODE DISSECTION  . BREAST BIOPSY Left 10/27/2015   path pending  . COLONOSCOPY    . MASTECTOMY Left 12/08/2015  . PARTIAL MASTECTOMY WITH AXILLARY SENTINEL LYMPH NODE BIOPSY Left 12/08/2015   Procedure: LEFT MASTECTOMY WITH AXILLARY SENTINEL LYMPH NODE BIOPSY;  Surgeon: Leonie Green, MD;  Location: [facility] ORS;  Service: General;  Laterality: Left;  Marland Kitchen VEIN BYPASS SURGERY     HPI:  Pt is a 35 F with hx of breast cancer, DM 2, suicidal ideation, depression/anxiety, stroke, HTN adm via EMS/ED with 2-3 days of N/V, new onset of seizure (out of hospital) and subsequent combative delirium. Required intubation due to extreme combativeness for 4 days. LP could not be successfully performed. Initially treated as possible meningitis vs encephalitis. MRI brain: No acute intracranial process on this mildly motion degraded examination. Neurology initially treating for seizure; EEG done yesterday without any signs of seizures. MD will D/c Keppra in next 3 days as likely infection is the cause of seizures. HSV still pending would  just finish up 7 day course of acyclovir. Pt is currently awake/verbally conversive but continuing to state she is "tired". Speech clear. Follows commands w/ gentle cues. Cues required to reattend to tasks intermittently.    Assessment / Plan / Recommendation Clinical Impression  Pt appears to present w/ mild-moderate oropharyngeal phase dysphagia w/ increased risk for aspiration at this time secondary moreso to her declined Cognitive status and confusion. Pt exhibited moderate oral phase deficits impacted by the Cognitive status c/b decreased attention to task requiring cues for follow through during the eating/drinking trials and lengthy mastication/oral phase time w/ increased textured foods. Pt continued to exhibit chewing behaviors and lingual rolling despite NO bolus material being in the mouth. Pt demonstrated no overt s/s of aspiration w/ trials of liquids or purees/soft solid but noted increased respiratory effort b/t the trials intermittently so rest breaks were given b/t trials as strategies to lessen WOB/SOB. No decline in O2 sats from baseline of mid90s. Pt required full feeding support; weak and shaky UEs, and pt required many cues for attending to task of self-drinking. Due to pt's lacking of dentition and declined Cognitive status impacting oral attention w/ boluses, recommend a Dysphagia level 1(puree) diet w/ thin liquids w/ strict aspiration precautions to lessen risk for aspiration. Recommend Pills in Puree - Crushed; 100% monitoring during all oral intake and rest breaks as needed. ST services will f/u w/ toleration of diet and trials to upgrade when medically appropriate. The modified diet consistency will lessen demand of mastication exertion and attention  during oral phase. NSG updated.  SLP Visit Diagnosis: Dysphagia, oropharyngeal phase (R13.12)    Aspiration Risk  Mild aspiration risk    Diet Recommendation  Dysphagia level 1(puree) w/ Thin liquids; aspiration precautions;  feeding support and monitoring at all meals. Reduce distractions and give rest breaks to avoid fatigue.   Medication Administration: Crushed with puree    Other  Recommendations Recommended Consults: (Dietician f/u) Oral Care Recommendations: Oral care BID;Staff/trained caregiver to provide oral care   Follow up Recommendations Skilled Nursing facility      Frequency and Duration min 3x week  2 weeks       Prognosis Prognosis for Safe Diet Advancement: Fair(-Good) Barriers to Reach Goals: Cognitive deficits;Behavior      Swallow Study   General Date of Onset: 11/04/17 HPI: Pt is a 14 F with hx of breast cancer, DM 2, suicidal ideation, depression/anxiety, stroke, HTN adm via EMS/ED with 2-3 days of N/V, new onset of seizure (out of hospital) and subsequent combative delirium. Required intubation due to extreme combativeness for 4 days. LP could not be successfully performed. Initially treated as possible meningitis vs encephalitis. MRI brain: No acute intracranial process on this mildly motion degraded examination. Neurology initially treating for seizure; EEG done yesterday without any signs of seizures. MD will D/c Keppra in next 3 days as likely infection is the cause of seizures. HSV still pending would just finish up 7 day course of acyclovir. Pt is currently awake/verbally conversive but continuing to state she is "tired". Speech clear. Follows commands w/ gentle cues. Cues required to reattend to tasks intermittently.  Type of Study: Bedside Swallow Evaluation Previous Swallow Assessment: none Diet Prior to this Study: Regular;Thin liquids Temperature Spikes Noted: Yes(99.8;  wbc 12.4) Respiratory Status: Nasal cannula(2-3 liters) History of Recent Intubation: Yes Length of Intubations (days): 4 days Date extubated: 11/07/17 Behavior/Cognition: Alert;Cooperative;Pleasant mood;Confused;Distractible;Requires cueing;Lethargic/Drowsy("tired") Oral Cavity Assessment: Dry Oral Care  Completed by SLP: Recent completion by staff Oral Cavity - Dentition: (bottom front dentition only; few upper dentition) Vision: Functional for self-feeding Self-Feeding Abilities: Able to feed self;Needs assist;Needs set up;Total assist Patient Positioning: Upright in bed Baseline Vocal Quality: Normal;Low vocal intensity Volitional Cough: Strong Volitional Swallow: Able to elicit    Oral/Motor/Sensory Function Overall Oral Motor/Sensory Function: Within functional limits   Ice Chips Ice chips: Within functional limits Presentation: Spoon(fed; 3 trials)   Thin Liquid Thin Liquid: Within functional limits Presentation: Cup;Self Fed(full assisted; 8 trials then 1 by straw) Other Comments: support to hold cup    Nectar Thick Nectar Thick Liquid: Not tested   Honey Thick Honey Thick Liquid: Not tested   Puree Puree: Within functional limits Presentation: Spoon(fed; 6 trials)   Solid   GO   Solid: Impaired(mech soft trials(2)) Presentation: Spoon(fed; 2 trials) Oral Phase Impairments: Impaired mastication;Poor awareness of bolus Oral Phase Functional Implications: Impaired mastication(lengthy oral phase time d/t Cognitive status) Pharyngeal Phase Impairments: (none) Other Comments: Cognitive decline impairing oral phase          Orinda Kenner, MS, CCC-SLP Watson,Katherine 11/08/2017,4:51 PM

## 2017-11-08 NOTE — Clinical Social Work Note (Signed)
CSW received consult for "unfit living conditions." CSW reviewed patient's record and did not see any documentation regarding this or why this was entered. CSW will speak with patient and husband. Patient may benefit from a PT evaluation as well. Shela Leff MSW,LCSW [phone]

## 2017-11-08 NOTE — Progress Notes (Signed)
Pharmacy Antibiotic Note  Yvette Tyler is a 72 y.o. female admitted on 11/04/2017 with acute encephalopathy and sepsis secondary to suspected meningitis.  Pharmacy has been consulted for acyclovir dosing.  Plan: 1/16 Continue dose of acyclovir 780 mg IV q8h.   Height: 5\' 7"  (170.2 cm) Weight: 192 lb 10.9 oz (87.4 kg) IBW/kg (Calculated) : 61.6  Temp (24hrs), Avg:99 F (37.2 C), Min:98.3 F (36.8 C), Max:99.8 F (37.7 C)  Recent Labs  Lab 11/04/17 1700 11/04/17 2004 11/05/17 0254 11/05/17 0626 11/06/17 0431 11/06/17 0600 11/06/17 1709 11/07/17 0415 11/08/17 0504  WBC 16.5*  --  11.6*  --   --  13.2*  --  12.7* 12.4*  CREATININE 1.04*  --  0.90  --  0.74  --   --  0.68 0.60  LATICACIDVEN 3.5* 2.6*  --  1.9  --   --   --   --   --   VANCOTROUGH  --   --   --   --   --   --  11*  --   --     Estimated Creatinine Clearance: 71.1 mL/min (by C-G formula based on SCr of 0.6 mg/dL).    Allergies  Allergen Reactions  . Hydrocodone-Acetaminophen Other (See Comments)    Increased dream activity  . Meperidine Other (See Comments)    Rapid heart rate  . Penicillins Rash    Has patient had a PCN reaction causing immediate rash, facial/tongue/throat swelling, SOB or lightheadedness with hypotension: Yes Has patient had a PCN reaction causing severe rash involving mucus membranes or skin necrosis: No Has patient had a PCN reaction that required hospitalization: No Has patient had a PCN reaction occurring within the last 10 years: No If all of the above answers are "NO", then may proceed with Cephalosporin use.     Antimicrobials this admission: Vanc 01/13 >> 01/15 Ceftriaxone 01/13 >> 01/15 Ampicillin 01/13 >> 01/15 Acyclovir 01/13 >>   Dose adjustments this admission:   Microbiology results: Influenza PCR 01/12 >> NEG MRSA PCR 01/13 >> POS Resp virus panel 01/12 >> All NEG Urine 01/12 >> NEG Resp 01/13 >>  Blood 01/13 >>  CSF bact cx 01/14 >>  CSF virus 01/14 >>   CSF HSV PCR 01/14 >>     Thank you for allowing pharmacy to be a part of this patient's care.  [place] Payten Hobin 11/08/2017 1:32 PM

## 2017-11-08 NOTE — Consult Note (Signed)
Much more awake and pt is following commands.     Past Medical History:  Diagnosis Date  . Anxiety   . Cancer ([place])   . Depression   . Diabetes mellitus without complication ([place])   . DVT (deep vein thrombosis) in pregnancy ([place])   . Hyperlipidemia   . Hypertension   . Insomnia   . Osteoarthritis   . Rectal bleeding   . Stroke ([place])   . Suicidal ideation     Past Surgical History:  Procedure Laterality Date  . AXILLARY LYMPH NODE DISSECTION Left 12/08/2015   Procedure: AXILLARY LYMPH NODE DISSECTION;  Surgeon: Leonie Green, MD;  Location: [facility] ORS;  Service: General;  Laterality: Left;  POSSIBLE AXILLARY NODE DISSECTION  . BREAST BIOPSY Left 10/27/2015   path pending  . COLONOSCOPY    . MASTECTOMY Left 12/08/2015  . PARTIAL MASTECTOMY WITH AXILLARY SENTINEL LYMPH NODE BIOPSY Left 12/08/2015   Procedure: LEFT MASTECTOMY WITH AXILLARY SENTINEL LYMPH NODE BIOPSY;  Surgeon: Leonie Green, MD;  Location: [facility] ORS;  Service: General;  Laterality: Left;  [place] Kitchen VEIN BYPASS SURGERY      Family History  Problem Relation Age of Onset  . Pancreatic cancer Father 65  . Colon cancer Paternal Uncle   . Pancreatic cancer Unknown   . Heart failure Mother   . Breast cancer Mother     Social History:  reports that  has never smoked. she has never used smokeless tobacco. She reports that she does not drink alcohol or use drugs.  Allergies  Allergen Reactions  . Hydrocodone-Acetaminophen Other (See Comments)    Increased dream activity  . Meperidine Other (See Comments)    Rapid heart rate  . Penicillins Rash    Has patient had a PCN reaction causing immediate rash, facial/tongue/throat swelling, SOB or lightheadedness with hypotension: Yes Has patient had a PCN reaction causing severe rash involving mucus membranes or skin necrosis: No Has patient had a PCN reaction that required hospitalization: No Has patient had a PCN reaction occurring within the last 10 years: No If all of  the above answers are "NO", then may proceed with Cephalosporin use.     Medications: I have reviewed the patient's current medications.  ROS: Unable to obtain due to intubation and sedation   Physical Examination: Blood pressure (!) 161/76, pulse 96, temperature 99 F (37.2 C), temperature source Oral, resp. rate 14, height 5\' 7"  (1.702 m), weight 192 lb 10.9 oz (87.4 kg), SpO2 95 %. Mental Status: Alert, able to tell me name  Cranial Nerves: II: Discs flat bilaterally; Visual fields grossly normal, pupils equal, round, reactive to light and accommodation III,IV, VI: ptosis not present, extra-ocular motions intact bilaterally V,VII: smile symmetric, facial light touch sensation normal bilaterally VIII: hearing normal bilaterally IX,X: gag reflex present XI: bilateral shoulder shrug XII: midline tongue extension Motor: Right : Upper extremity   4/5    Left:     Upper extremity   4/5  Lower extremity   4/5     Lower extremity   4/5 Tone and bulk:normal tone throughout; no atrophy noted Sensory: Pinprick and light touch intact throughout, bilaterally Deep Tendon Reflexes: 1+ and symmetric throughout Plantars: Right: downgoing   Left: downgoing Cerebellar: Not tested Gait: not tested       Laboratory Studies:   Basic Metabolic Panel: Recent Labs  Lab 11/04/17 1700 11/05/17 0254 11/06/17 0431 11/07/17 0415 11/08/17 0504  NA 134* 139 139 147* 142  K 3.5 4.0 2.7*  3.9 3.4*  CL 97* 112* 104 115* 104  CO2 23 18* 23 27 31   GLUCOSE 571* 452* 418* 169* 90  BUN 12 14 19 15 10   CREATININE 1.04* 0.90 0.74 0.68 0.60  CALCIUM 9.1 7.9* 7.7* 8.1* 8.5*  MG  --  2.0 1.8  --  1.7  PHOS  --  3.6 2.8  --  1.6*    Liver Function Tests: Recent Labs  Lab 11/04/17 1700 11/05/17 0254 11/07/17 0415  AST 38 27 22  ALT 27 23 22   ALKPHOS 113 84 56  BILITOT 1.1 1.2 0.2*  PROT 7.7 6.0* 4.8*  ALBUMIN 3.8 3.1* 2.4*   No results for input(s): LIPASE, AMYLASE in the last 168  hours. Recent Labs  Lab 11/04/17 1701 11/05/17 0254  AMMONIA 14 17    CBC: Recent Labs  Lab 11/04/17 1700 11/05/17 0254 11/06/17 0600 11/07/17 0415 11/08/17 0504  WBC 16.5* 11.6* 13.2* 12.7* 12.4*  NEUTROABS 14.4*  --   --   --   --   HGB 15.6 13.4 12.2 11.5* 13.1  HCT 46.9 40.3 36.3 33.6* 38.9  MCV 89.9 90.9 89.8 89.6 90.4  PLT 235 203 170 148* 153    Cardiac Enzymes: Recent Labs  Lab 11/04/17 1700 11/05/17 0254 11/05/17 0900 11/05/17 1412  TROPONINI <0.03 0.03* <0.03 <0.03    BNP: Invalid input(s): POCBNP  CBG: Recent Labs  Lab 11/07/17 1737 11/07/17 1928 11/08/17 0003 11/08/17 0503 11/08/17 0730  GLUCAP 135* 133* 125* 64 107*    Microbiology: Results for orders placed or performed during the hospital encounter of 11/04/17  Urine Culture     Status: None   Collection Time: 11/04/17  5:02 PM  Result Value Ref Range Status   Specimen Description   Final    URINE, RANDOM Performed at [facility], [address], [place], [place] [address]    Special Requests   Final    NONE Performed at [facility], [address], [place], [place] [address]    Culture   Final    NO GROWTH Performed at [facility], [place] [address], [place], [place] [address]    Report Status 11/06/2017 FINAL  Final  Respiratory Panel by PCR     Status: None   Collection Time: 11/04/17  5:54 PM  Result Value Ref Range Status   Adenovirus NOT DETECTED NOT DETECTED Final   Coronavirus 229E NOT DETECTED NOT DETECTED Final   Coronavirus HKU1 NOT DETECTED NOT DETECTED Final   Coronavirus NL63 NOT DETECTED NOT DETECTED Final   Coronavirus OC43 NOT DETECTED NOT DETECTED Final   Metapneumovirus NOT DETECTED NOT DETECTED Final   Rhinovirus / Enterovirus NOT DETECTED NOT DETECTED Final   Influenza A NOT DETECTED NOT DETECTED Final   Influenza B NOT DETECTED NOT DETECTED Final   Parainfluenza Virus 1 NOT DETECTED NOT DETECTED Final   Parainfluenza Virus 2  NOT DETECTED NOT DETECTED Final   Parainfluenza Virus 3 NOT DETECTED NOT DETECTED Final   Parainfluenza Virus 4 NOT DETECTED NOT DETECTED Final   Respiratory Syncytial Virus NOT DETECTED NOT DETECTED Final   Bordetella pertussis NOT DETECTED NOT DETECTED Final   Chlamydophila pneumoniae NOT DETECTED NOT DETECTED Final   Mycoplasma pneumoniae NOT DETECTED NOT DETECTED Final  MRSA PCR Screening     Status: Abnormal   Collection Time: 11/05/17  2:25 AM  Result Value Ref Range Status   MRSA by PCR POSITIVE (A) NEGATIVE Final    Comment:  The GeneXpert MRSA Assay (FDA approved for NASAL specimens only), is one component of a comprehensive MRSA colonization surveillance program. It is not intended to diagnose MRSA infection nor to guide or monitor treatment for MRSA infections. RESULT CALLED TO, READ BACK BY AND VERIFIED WITH: ALECIA RAWLINS AT 9563 ON 11/05/17 RWW Performed at [facility], [address] [place], [place] [address]   Culture, blood (Routine X 2) w Reflex to ID Panel     Status: None (Preliminary result)   Collection Time: 11/05/17  6:27 AM  Result Value Ref Range Status   Specimen Description BLOOD RIGHT WRIST  Final   Special Requests   Final    BOTTLES DRAWN AEROBIC AND ANAEROBIC Blood Culture adequate volume   Culture   Final    NO GROWTH 3 DAYS Performed at [facility], [address] [place], [place] [address]    Report Status PENDING  Incomplete  Culture, blood (Routine X 2) w Reflex to ID Panel     Status: None (Preliminary result)   Collection Time: 11/05/17  6:27 AM  Result Value Ref Range Status   Specimen Description BLOOD RIGHT HAND  Final   Special Requests   Final    BOTTLES DRAWN AEROBIC AND ANAEROBIC Blood Culture adequate volume   Culture   Final    NO GROWTH 3 DAYS Performed at [facility], [address], [place], [place] [address]    Report Status PENDING  Incomplete  Culture, respiratory  (NON-Expectorated)     Status: None   Collection Time: 11/05/17  3:26 PM  Result Value Ref Range Status   Specimen Description   Final    TRACHEAL ASPIRATE Performed at [facility], [address], [place], [place] [address]    Special Requests   Final    NONE Performed at [facility], [address] [place], [place] [address]    Gram Stain   Final    FEW WBC PRESENT, PREDOMINANTLY MONONUCLEAR NO ORGANISMS SEEN    Culture   Final    RARE Consistent with normal respiratory flora. Performed at [facility], [place] [address] [place], [place] [address]    Report Status 11/08/2017 FINAL  Final  CSF culture     Status: None (Preliminary result)   Collection Time: 11/06/17 12:35 PM  Result Value Ref Range Status   Specimen Description   Final    CSF Performed at [facility], [address] [place], [place] [address]    Special Requests   Final    NONE Performed at [facility], [place], [place] [address]    Gram Stain NO ORGANISMS SEEN CYTOSPIN SMEAR   Final   Culture   Final    NO GROWTH < 24 HOURS Performed at [facility], [place] [address], [place], [place] [address]    Report Status PENDING  Incomplete  Virus culture     Status: None   Collection Time: 11/06/17 12:35 PM  Result Value Ref Range Status   Viral Culture Comment  Final    Comment: (NOTE) Preliminary Report: No virus isolated at 24 hours. Next report to follow after 4 days. Performed At: [facility] [place], [place] [id number] Rush Farmer MD VO:[id number]    Source of Sample CSF  Final    Comment: Performed at [facility], [address] [place], [place] [address]    Coagulation Studies: No results for  input(s): LABPROT, INR in the last 72 hours.  Urinalysis:  Recent Labs  Lab 11/04/17 1702  COLORURINE STRAW*  LABSPEC 1.023  PHURINE 6.0  GLUCOSEU >=500*  HGBUR SMALL*  BILIRUBINUR  NEGATIVE  KETONESUR 20*  PROTEINUR 30*  NITRITE NEGATIVE  LEUKOCYTESUR NEGATIVE    Lipid Panel:     Component Value Date/Time   CHOL 197 10/13/2012 0557   TRIG 77 11/06/2017 2058   TRIG 237 (H) 10/13/2012 0557   HDL 29 (L) 10/13/2012 0557   VLDL 47 (H) 10/13/2012 0557   LDLCALC 121 (H) 10/13/2012 0557    HgbA1C:  Lab Results  Component Value Date   HGBA1C 11.1 (H) 10/13/2012    Urine Drug Screen:      Component Value Date/Time   LABOPIA NONE DETECTED 11/04/2017 1702   COCAINSCRNUR NONE DETECTED 11/04/2017 1702   LABBENZ POSITIVE (A) 11/04/2017 1702   AMPHETMU NONE DETECTED 11/04/2017 1702   THCU NONE DETECTED 11/04/2017 1702   LABBARB NONE DETECTED 11/04/2017 1702    Alcohol Level: No results for input(s): ETH in the last 168 hours.  Other results:  Imaging: Dg Chest Port 1 View  Result Date: 11/07/2017 CLINICAL DATA:  72 year old female with respiratory failure. Subsequent encounter. EXAM: PORTABLE CHEST 1 VIEW COMPARISON:  11/05/2017 chest x-ray. FINDINGS: Endotracheal tube tip 4 cm above the carina. Right central line tip proximal to mid superior vena cava level. Nasogastric tube courses below the diaphragm. Tip is not included on the present exam. Interval development of bilateral pleural effusions with basilar consolidation which may represent atelectasis or infiltrates. Pulmonary vascular congestion. Mild cardiomegaly. No pneumothorax. No acute osseous abnormality. IMPRESSION: Interval development of bilateral pleural effusions with basilar consolidation which may represent atelectasis or infiltrates. Pulmonary vascular congestion. Mild cardiomegaly. Electronically Signed   By: Genia Del M.D.   On: 11/07/2017 06:28   Dg Fluoro Guided Loc Of Needle/cath Tip For Spinal Inject Lt  Result Date: 11/06/2017 CLINICAL DATA:  Altered mental status EXAM: DIAGNOSTIC LUMBAR PUNCTURE UNDER FLUOROSCOPIC GUIDANCE FLUOROSCOPY TIME:  Fluoroscopy Time:  0.6 minute Radiation  Exposure Index (if provided by the fluoroscopic device): 9.5 mGy Number of Acquired Spot Images: 0 PROCEDURE: Informed consent was obtained from the patient prior to the procedure, including potential complications of headache, allergy, and pain. With the patient prone, the lower back was prepped with Betadine. 1% Lidocaine was used for local anesthesia. Lumbar puncture was performed at the L4-5 level using a 22 gauge needle with return of clear CSF. 12 ml of CSF were obtained for laboratory studies. The patient tolerated the procedure well and there were no apparent complications. IMPRESSION: Successful fluoroscopic guided lumbar puncture. Electronically Signed   By: Kathreen Devoid   On: 11/06/2017 12:42     Assessment/Plan:  72 y.o. female  who presents with altered mental status.  Patient is unable to contribute information to her HPI given that she is intubated and sedated.  Her husband states that she has had some malaise for the past couple of days.  Tonight he helped her to the bathroom and back to her room.  She then proceeded to vomit and then had what sounds like a full on tonic-clonic seizure.  In the ambulance she is started to arouse and then became very agitated and confused.  She required heavy amounts of sedating medications here, eventually requiring intubation.  Patient's husband states that she has been complaining of a headache for the past couple of days.   Currently on Keppra and  will titrate to 500 BID EEG done yesterday without any signs of seizures D/c Keppra in next 3 days as likely infection is the cause of seizures HSV still pending would just finish up 7 day course of acyclovir    11/08/2017, 12:05 PM

## 2017-11-08 NOTE — Progress Notes (Signed)
[place] [facility] [place] NAME: Yvette Tyler    MR#:  [id number]  DATE OF BIRTH:  08/21/1946  SUBJECTIVE:  CHIEF COMPLAINT:  Pt is extubated, fluoroscopy guided spinal tap done 1/14 Awake and alert but intermittent episodes of confusion noticed.  Husband at bedside   REVIEW OF SYSTEMS:  ROS limited-feels better Respiratory denies any shortness of breath Cardiac denies any chest pain  DRUG ALLERGIES:   Allergies  Allergen Reactions  . Hydrocodone-Acetaminophen Other (See Comments)    Increased dream activity  . Meperidine Other (See Comments)    Rapid heart rate  . Penicillins Rash    Has patient had a PCN reaction causing immediate rash, facial/tongue/throat swelling, SOB or lightheadedness with hypotension: Yes Has patient had a PCN reaction causing severe rash involving mucus membranes or skin necrosis: No Has patient had a PCN reaction that required hospitalization: No Has patient had a PCN reaction occurring within the last 10 years: No If all of the above answers are "NO", then may proceed with Cephalosporin use.     VITALS:  Blood pressure (!) 158/82, pulse 95, temperature 99 F (37.2 C), temperature source Oral, resp. rate (!) 21, height 5\' 7"  (1.702 m), weight 87.4 kg (192 lb 10.9 oz), SpO2 91 %.  PHYSICAL EXAMINATION:  GENERAL:  72 y.o.-year-old patient lying in the bed with no acute distress.  EYES: Pupils equal, round, reactive to light and accommodation. No scleral icterus.  HEENT: Head atraumatic, normocephalic. Oropharynx clear NECK:  Supple, no jugular venous distention. No thyroid enlargement, no tenderness.  LUNGS:Mod breath sounds bilaterally, no wheezing, rales,rhonchi or crepitation. No use of accessory muscles of respiration.  CARDIOVASCULAR: S1, S2 normal. No murmurs, rubs, or gallops.  ABDOMEN: Soft, nontender, nondistended. Bowel sounds present. EXTREMITIES: No pedal edema, cyanosis, or clubbing.   NEUROLOGIC: Awake and alert with intermittent episodes of confusion PSYCHIATRIC: The patient is awake and alert but intermittent episodes of confusion SKIN: No obvious rash, lesion, or ulcer.    LABORATORY PANEL:   CBC Recent Labs  Lab 11/08/17 0504  WBC 12.4*  HGB 13.1  HCT 38.9  PLT 153   ------------------------------------------------------------------------------------------------------------------  Chemistries  Recent Labs  Lab 11/07/17 0415 11/08/17 0504  NA 147* 142  K 3.9 3.4*  CL 115* 104  CO2 27 31  GLUCOSE 169* 90  BUN 15 10  CREATININE 0.68 0.60  CALCIUM 8.1* 8.5*  MG  --  1.7  AST 22  --   ALT 22  --   ALKPHOS 56  --   BILITOT 0.2*  --    ------------------------------------------------------------------------------------------------------------------  Cardiac Enzymes Recent Labs  Lab 11/05/17 1412  TROPONINI <0.03   ------------------------------------------------------------------------------------------------------------------  RADIOLOGY:  Dg Chest Port 1 View  Result Date: 11/07/2017 CLINICAL DATA:  72 year old female with respiratory failure. Subsequent encounter. EXAM: PORTABLE CHEST 1 VIEW COMPARISON:  11/05/2017 chest x-ray. FINDINGS: Endotracheal tube tip 4 cm above the carina. Right central line tip proximal to mid superior vena cava level. Nasogastric tube courses below the diaphragm. Tip is not included on the present exam. Interval development of bilateral pleural effusions with basilar consolidation which may represent atelectasis or infiltrates. Pulmonary vascular congestion. Mild cardiomegaly. No pneumothorax. No acute osseous abnormality. IMPRESSION: Interval development of bilateral pleural effusions with basilar consolidation which may represent atelectasis or infiltrates. Pulmonary vascular congestion. Mild cardiomegaly. Electronically Signed   By: Genia Del M.D.   On: 11/07/2017 06:28    EKG:   Orders placed  or performed  during the hospital encounter of 11/04/17  . ED EKG  . ED EKG  . EKG 12-Lead  . EKG 12-Lead  . EKG 12-Lead  . EKG 12-Lead    ASSESSMENT AND PLAN:   Sepsis ([place]) -suspicion for meningitis as her source of altered mental status Failed LP * 3 in ED,had LP by IR  1/14 , WBC in CSF -2 CSF culture, viral culture are negative so far Possible encephalitis Antibiotics Rocephin, ampicillin, vanc  discontinued by intensivist acyclovir,  IV Decadron initially given which was discontinued eventually cultures ordered.   Lactic acid is elevated,  trend her lactate until within normal limits  IV fluids     Acute encephalopathy -due to meningitis versus encephalitis  AK I resolved with IV fluids  Acute resp failure  extubated November 07, 2017    Seizure -ikely due to her encephalopathy loaded with IV Keppra, continue Keppra q 12 hrs, dose need to be adjusted by neurology MRI brain no acute findings    neurology consult appreciated  EEG today    HTN (hypertension) -stable, currently normotensive, can use as needed antihypertensives if her blood pressure rises, continue home meds when she is more alert and taking p.o.    Diabetes ([place]) -sliding scale insulin with corresponding glucose checks    HLD (hyperlipidemia) -resume home med  anti-lipids when she is more alert      All the records are reviewed and case discussed with Care Management/Social Workerr. Management plans discussed with the patient, family and they are in agreement.  CODE STATUS: fc   TOTAL TIME TAKING CARE OF THIS PATIENT: 35 minutes.   POSSIBLE D/C IN 2 DAYS, DEPENDING ON CLINICAL CONDITION.  Note: This dictation was prepared with Dragon dictation along with smaller phrase technology. Any transcriptional errors that result from this process are unintentional.   Nicholes Mango M.D on 11/08/2017 at 3:51 PM  Between 7am to 6pm - Pager - [phone] After 6pm go to [email or web address] - password [email or web address] [facility]  [facility]  Office  [phone]  CC: Primary care physician; Glendon Axe, MD

## 2017-11-08 NOTE — Progress Notes (Signed)
PULMONARY / CRITICAL CARE MEDICINE   Name: Yvette Tyler MRN: [id number] DOB: 1945-11-10    ADMISSION DATE:  11/04/2017  PT PROFILE:   89 F with hx of breast cancer, DM 2, depression adm via EMS/ED with 2-3 days of N/V, new onset of seizure (out of hospital) and subsequent combative delirium. Required intubation due to extreme combativeness. LP could not be successfully performed. Initially treated as possible meningitis vs encephalitis.   MAJOR EVENTS/TEST RESULTS: 01/12 Admission as above. Empiric abx and anti-convulsants initiated 01/12 CT head: No acute intracranial findings or mass lesions 01/13 Neurology Consultation:  01/13 MRI brain: No acute intracranial process on this mildly motion degraded examination. 01/14 EEG: No epileptiform activity is noted. 01/15 Passed SBT. + F/C intermittently. Extubated. Periodic breathing pattern improved after naloxone 01/16 level of consciousness much improved.  No distress.  Remains somewhat poorly oriented.  Denies intentional overdose.   INDWELLING DEVICES:: ETT 01/12 >> 01/15 R IJ CVL 01/12 >>   MICRO DATA: Influenza PCR 01/12 >> NEG MRSA PCR 01/13 >> POS Resp virus panel 01/12 >> All NEG Urine 01/12 >> NEG Resp 01/13 >>  Blood 01/13 >>  CSF bact cx 01/14 >>  CSF virus 01/14 >>  CSF HSV PCR 01/14 >>   PCT 01/13: 0.26, 0.16, < 0.10  ANTIMICROBIALS:  Vanc 01/13 >> 01/15 Ceftriaxone 01/13 >> 01/15 Ampicillin 01/13 >> 01/15 Acyclovir 01/13 >>     SUBJECTIVE:  Level of consciousness much improved.  No distress.  Remains somewhat poorly oriented.  Denies intentional overdose.  VITAL SIGNS: BP (!) 168/81   Pulse (!) 110   Temp 99 F (37.2 C) (Oral)   Resp 14   Ht 5\' 7"  (1.702 m)   Wt 87.4 kg (192 lb 10.9 oz)   SpO2 92%   BMI 30.18 kg/m   HEMODYNAMICS:    VENTILATOR SETTINGS:    INTAKE / OUTPUT: I/O last 3 completed shifts: In: 4921.2 [I.V.:2362.2; NG/GT:149.8; IV Piggyback:2409.2] Out: 2625  [Urine:2625]  PHYSICAL EXAMINATION: General: RASS 0, + F/C. Poorly oriented Neuro: Cranial nerves intact, motor and sensory intact HEENT: NCAT, sclerae white Cardiovascular: Regular, no M Lungs: Clear anteriorly Abdomen: Soft, + BS Ext: Extensive bruising on BUE, no LE edema, warm  LABS:  BMET Recent Labs  Lab 11/06/17 0431 11/07/17 0415 11/08/17 0504  NA 139 147* 142  K 2.7* 3.9 3.4*  CL 104 115* 104  CO2 23 27 31   BUN 19 15 10   CREATININE 0.74 0.68 0.60  GLUCOSE 418* 169* 90    Electrolytes Recent Labs  Lab 11/05/17 0254 11/06/17 0431 11/07/17 0415 11/08/17 0504  CALCIUM 7.9* 7.7* 8.1* 8.5*  MG 2.0 1.8  --  1.7  PHOS 3.6 2.8  --  1.6*    CBC Recent Labs  Lab 11/06/17 0600 11/07/17 0415 11/08/17 0504  WBC 13.2* 12.7* 12.4*  HGB 12.2 11.5* 13.1  HCT 36.3 33.6* 38.9  PLT 170 148* 153    Coag's Recent Labs  Lab 11/04/17 1700 11/05/17 0254  APTT  --  <24*  INR 1.01 1.03    Sepsis Markers Recent Labs  Lab 11/04/17 1700 11/04/17 2004 11/05/17 0254 11/05/17 0626 11/06/17 0431 11/07/17 0415  LATICACIDVEN 3.5* 2.6*  --  1.9  --   --   PROCALCITON  --   --  0.26  --  0.16 <0.10    ABG Recent Labs  Lab 11/04/17 2004 11/05/17 0310 11/05/17 0810  PHART 7.36 7.17* 7.35  PCO2ART 30* 43 47  PO2ART 86 92 90    Liver Enzymes Recent Labs  Lab 11/04/17 1700 11/05/17 0254 11/07/17 0415  AST 38 27 22  ALT 27 23 22   ALKPHOS 113 84 56  BILITOT 1.1 1.2 0.2*  ALBUMIN 3.8 3.1* 2.4*    Cardiac Enzymes Recent Labs  Lab 11/05/17 0254 11/05/17 0900 11/05/17 1412  TROPONINI 0.03* <0.03 <0.03    Glucose Recent Labs  Lab 11/07/17 1737 11/07/17 1928 11/08/17 0003 11/08/17 0503 11/08/17 0730 11/08/17 1157  GLUCAP 135* 133* 125* 94 107* 115*    CXR: No new film    ASSESSMENT / PLAN:  NEUROLOGIC A:   Acute encephalopathy, resolving Possible new onset seizure Possible encephalitis P:   Avoid all sedatives Keppra dose to be  decreased per neurology Complete 7 days acyclovir  PULMONARY A: Acute respiratory failure, resolved Small bilateral pleural effusions  P:   Supplemental O2 as needed  CARDIOVASCULAR A:  Sinus tachycardia, resolved Hypotension, resolved Hypertension, controlled P:  PRN metoprolol to maintain HR < 115/min PRN hydralazine to maintain SBP < 160 mmHg  RENAL A:   AKI, resolved Recurrent hypokalemia Hypernatremia P:   Monitor BMET intermittently Monitor I/Os Correct electrolytes as indicated  Continue IVF's until able to take p.o.'s  GASTROINTESTINAL A:   No issues P:   SUP: EN/I post extubation NPO post extubation SLP evaluation requested  HEMATOLOGIC A:   Mild thrombocytopenia, resolved P:  DVT px: Enoxaparin Monitor CBC intermittently Transfuse per usual guidelines   INFECTIOUS A:   Possible encephalitis P:   Monitor temp, WBC count Micro and abx as above   ENDOCRINE A:   DM 2, controlled Severe hyperglycemia, resolved P:   Cont Lantus plus SSI Change SSI to ACHS once diet is initiated   FAMILY  - Updates: No family at bedside   Will watch in SDU through today   Merton Border, MD PCCM service Mobile [phone] Pager [phone] 11/08/2017, 1:05 PM

## 2017-11-09 DIAGNOSIS — F411 Generalized anxiety disorder: Secondary | ICD-10-CM

## 2017-11-09 LAB — BASIC METABOLIC PANEL
Anion gap: 7 (ref 5–15)
BUN: 7 mg/dL (ref 6–20)
CALCIUM: 8.8 mg/dL — AB (ref 8.9–10.3)
CHLORIDE: 97 mmol/L — AB (ref 101–111)
CO2: 32 mmol/L (ref 22–32)
CREATININE: 0.55 mg/dL (ref 0.44–1.00)
GFR calc non Af Amer: >60 mL/min (ref >60)
Glucose, Bld: 175 mg/dL — ABNORMAL HIGH (ref 65–99)
Potassium: 3.4 mmol/L — ABNORMAL LOW (ref 3.5–5.1)
SODIUM: 136 mmol/L (ref 135–145)

## 2017-11-09 LAB — GLUCOSE, CAPILLARY
GLUCOSE-CAPILLARY: 181 mg/dL — AB (ref 65–99)
GLUCOSE-CAPILLARY: 171 mg/dL — AB (ref 65–99)
Glucose-Capillary: 199 mg/dL — ABNORMAL HIGH (ref 65–99)
Glucose-Capillary: 165 mg/dL — ABNORMAL HIGH (ref 65–99)
Glucose-Capillary: 139 mg/dL — ABNORMAL HIGH (ref 65–99)

## 2017-11-09 LAB — PHOSPHORUS: PHOSPHORUS: 2.3 mg/dL — AB (ref 2.5–4.6)

## 2017-11-09 MED ORDER — SODIUM CHLORIDE 0.9 % IV SOLN
500.0000 mg | Freq: Every day | INTRAVENOUS | Status: AC
  Administered 2017-11-10: 500 mg via INTRAVENOUS
  Filled 2017-11-09: qty 5

## 2017-11-09 MED ORDER — ALPRAZOLAM 0.5 MG PO TABS
0.5000 mg | ORAL_TABLET | Freq: Four times a day (QID) | ORAL | Status: DC | PRN
  Administered 2017-11-09 – 2017-11-13 (×5): 0.5 mg via ORAL
  Filled 2017-11-09 (×5): qty 1

## 2017-11-09 MED ORDER — IMIPRAMINE HCL 50 MG PO TABS
100.0000 mg | ORAL_TABLET | Freq: Every day | ORAL | Status: DC
  Administered 2017-11-09 – 2017-11-12 (×4): 100 mg via ORAL
  Filled 2017-11-09 (×5): qty 2

## 2017-11-09 MED ORDER — QUETIAPINE FUMARATE 300 MG PO TABS
300.0000 mg | ORAL_TABLET | Freq: Every day | ORAL | Status: DC
  Administered 2017-11-09 – 2017-11-12 (×4): 300 mg via ORAL
  Filled 2017-11-09 (×5): qty 1

## 2017-11-09 MED ORDER — VALACYCLOVIR HCL 500 MG PO TABS
1000.0000 mg | ORAL_TABLET | Freq: Three times a day (TID) | ORAL | Status: AC
  Administered 2017-11-09 – 2017-11-11 (×7): 1000 mg via ORAL
  Filled 2017-11-09 (×8): qty 2

## 2017-11-09 MED ORDER — AMLODIPINE BESYLATE 5 MG PO TABS
5.0000 mg | ORAL_TABLET | Freq: Every day | ORAL | Status: DC
  Administered 2017-11-09 – 2017-11-13 (×5): 5 mg via ORAL
  Filled 2017-11-09 (×5): qty 1

## 2017-11-09 MED ORDER — HYDRALAZINE HCL 20 MG/ML IJ SOLN: 10.0000 mg | INTRAMUSCULAR | Status: DC | PRN

## 2017-11-09 MED ORDER — METOPROLOL TARTRATE 25 MG PO TABS
25.0000 mg | ORAL_TABLET | Freq: Two times a day (BID) | ORAL | Status: DC
  Administered 2017-11-09 – 2017-11-10 (×3): 25 mg via ORAL
  Filled 2017-11-09 (×3): qty 1

## 2017-11-09 MED ORDER — POTASSIUM CHLORIDE 20 MEQ PO PACK
40.0000 meq | PACK | Freq: Once | ORAL | Status: AC
  Administered 2017-11-09: 40 meq via ORAL
  Filled 2017-11-09: qty 2

## 2017-11-09 MED ORDER — PAROXETINE HCL 20 MG PO TABS
40.0000 mg | ORAL_TABLET | Freq: Every day | ORAL | Status: DC
  Administered 2017-11-09 – 2017-11-13 (×5): 40 mg via ORAL
  Filled 2017-11-09 (×5): qty 2

## 2017-11-09 NOTE — Progress Notes (Signed)
PT is recommending SNF. Clinical Education officer, museum (CSW) attempted to meet with patient however per nurse tech she is confused. CSW attempted to contact patient's son [name] and husband Delfino Lovett however they did not answer. Kyle's voicemail was full and CSW was unable to leave a message.   [employer], LCSW [phone]

## 2017-11-09 NOTE — Progress Notes (Signed)
Patient ID: Yvette Tyler, female   DOB: [birth year], 72 y.o.   MRN: [id number]  [facility] PROGRESS NOTE  Yvette Tyler:[id number] DOB: 11/15/1945 DOA: 11/04/2017 PCP: Yvette Axe, MD  HPI/Subjective: Patient states that she did get cleaned up properly.  She is asking for a Kleenex to spit out the phlegm.  Feels weak.  Objective: Vitals:   11/09/17 0513 11/09/17 0758  BP: (!) 187/89 (!) 179/82  Pulse: (!) 105 (!) 112  Resp:  16  Temp: 97.8 F (36.6 C) 98.9 F (37.2 C)  SpO2: 92% 93%    Intake/Output Summary (Last 24 hours) at 11/09/2017 0859 Last data filed at 11/09/2017 0511 Gross per 24 hour  Intake 1260.13 ml  Output 3600 ml  Net -2339.87 ml   Filed Weights   11/07/17 0447 11/08/17 0600 11/09/17 0500  Weight: 89.1 kg (196 lb 6.9 oz) 87.4 kg (192 lb 10.9 oz) 54.4 kg (120 lb)    ROS: Review of Systems  Constitutional: Negative for chills and fever.  Eyes: Negative for blurred vision.  Respiratory: Negative for cough and shortness of breath.   Cardiovascular: Negative for chest pain.  Gastrointestinal: Negative for abdominal pain, constipation, diarrhea, nausea and vomiting.  Genitourinary: Negative for dysuria.  Musculoskeletal: Negative for joint pain.  Neurological: Negative for dizziness and headaches.   Exam: Physical Exam  HENT:  Nose: No mucosal edema.  Mouth/Throat: No oropharyngeal exudate or posterior oropharyngeal edema.  Eyes: Conjunctivae, EOM and lids are normal. Pupils are equal, round, and reactive to light.  Neck: No JVD present. Carotid bruit is not present. No edema present. No thyroid mass and no thyromegaly present.  Cardiovascular: S1 normal and S2 normal. Exam reveals no gallop.  No murmur heard. Pulses:      Dorsalis pedis pulses are 2+ on the right side, and 2+ on the left side.  Respiratory: No respiratory distress. She has no wheezes. She has no rhonchi. She has no rales.  GI: Soft. Bowel sounds are normal. There is no  tenderness.  Musculoskeletal:       Right ankle: She exhibits no swelling.       Left ankle: She exhibits no swelling.  Lymphadenopathy:    She has no cervical adenopathy.  Neurological: She is alert. No cranial nerve deficit.  Skin: Skin is warm. No rash noted. Nails show no clubbing.  Psychiatric: She has a normal mood and affect.      Data Reviewed: Basic Metabolic Panel: Recent Labs  Lab 11/05/17 0254 11/06/17 0431 11/07/17 0415 11/08/17 0504 11/08/17 2115 11/09/17 0720  NA 139 139 147* 142  --  136  K 4.0 2.7* 3.9 3.4* 3.7 3.4*  CL 112* 104 115* 104  --  97*  CO2 18* 23 27 31   --  32  GLUCOSE 452* 418* 169* 90  --  175*  BUN 14 19 15 10   --  7  CREATININE 0.90 0.74 0.68 0.60  --  0.55  CALCIUM 7.9* 7.7* 8.1* 8.5*  --  8.8*  MG 2.0 1.8  --  1.7 1.8  --   PHOS 3.6 2.8  --  1.6* 2.0* 2.3*   Liver Function Tests: Recent Labs  Lab 11/04/17 1700 11/05/17 0254 11/07/17 0415  AST 38 27 22  ALT 27 23 22   ALKPHOS 113 84 56  BILITOT 1.1 1.2 0.2*  PROT 7.7 6.0* 4.8*  ALBUMIN 3.8 3.1* 2.4*    Recent Labs  Lab 11/04/17 1701 11/05/17 0254  AMMONIA 14  17   CBC: Recent Labs  Lab 11/04/17 1700 11/05/17 0254 11/06/17 0600 11/07/17 0415 11/08/17 0504  WBC 16.5* 11.6* 13.2* 12.7* 12.4*  NEUTROABS 14.4*  --   --   --   --   HGB 15.6 13.4 12.2 11.5* 13.1  HCT 46.9 40.3 36.3 33.6* 38.9  MCV 89.9 90.9 89.8 89.6 90.4  PLT 235 203 170 148* 153   Cardiac Enzymes: Recent Labs  Lab 11/04/17 1700 11/05/17 0254 11/05/17 0900 11/05/17 1412  TROPONINI <0.03 0.03* <0.03 <0.03    CBG: Recent Labs  Lab 11/08/17 0503 11/08/17 0730 11/08/17 1157 11/08/17 1803 11/08/17 2219  GLUCAP 94 107* 115* 168* 145*    Recent Results (from the past 240 hour(s))  Urine Culture     Status: None   Collection Time: 11/04/17  5:02 PM  Result Value Ref Range Status   Specimen Description   Final    URINE, RANDOM Performed at [facility], [address],  [place], [place] [address]    Special Requests   Final    NONE Performed at [facility], [address] [place], [place] [address]    Culture   Final    NO GROWTH Performed at [facility], [place] [address] [place], [place] [address]    Report Status 11/06/2017 FINAL  Final  Respiratory Panel by PCR     Status: None   Collection Time: 11/04/17  5:54 PM  Result Value Ref Range Status   Adenovirus NOT DETECTED NOT DETECTED Final   Coronavirus 229E NOT DETECTED NOT DETECTED Final   Coronavirus HKU1 NOT DETECTED NOT DETECTED Final   Coronavirus NL63 NOT DETECTED NOT DETECTED Final   Coronavirus OC43 NOT DETECTED NOT DETECTED Final   Metapneumovirus NOT DETECTED NOT DETECTED Final   Rhinovirus / Enterovirus NOT DETECTED NOT DETECTED Final   Influenza A NOT DETECTED NOT DETECTED Final   Influenza B NOT DETECTED NOT DETECTED Final   Parainfluenza Virus 1 NOT DETECTED NOT DETECTED Final   Parainfluenza Virus 2 NOT DETECTED NOT DETECTED Final   Parainfluenza Virus 3 NOT DETECTED NOT DETECTED Final   Parainfluenza Virus 4 NOT DETECTED NOT DETECTED Final   Respiratory Syncytial Virus NOT DETECTED NOT DETECTED Final   Bordetella pertussis NOT DETECTED NOT DETECTED Final   Chlamydophila pneumoniae NOT DETECTED NOT DETECTED Final   Mycoplasma pneumoniae NOT DETECTED NOT DETECTED Final  MRSA PCR Screening     Status: Abnormal   Collection Time: 11/05/17  2:25 AM  Result Value Ref Range Status   MRSA by PCR POSITIVE (A) NEGATIVE Final    Comment:        The GeneXpert MRSA Assay (FDA approved for NASAL specimens only), is one component of a comprehensive MRSA colonization surveillance program. It is not intended to diagnose MRSA infection nor to guide or monitor treatment for MRSA infections. RESULT CALLED TO, READ BACK BY AND VERIFIED WITH: ALECIA RAWLINS AT 8338 ON 11/05/17 RWW Performed at [facility], [address] [place], [place] [address]   Culture, blood  (Routine X 2) w Reflex to ID Panel     Status: None (Preliminary result)   Collection Time: 11/05/17  6:27 AM  Result Value Ref Range Status   Specimen Description BLOOD RIGHT WRIST  Final   Special Requests   Final    BOTTLES DRAWN AEROBIC AND ANAEROBIC Blood Culture adequate volume   Culture   Final    NO GROWTH 4 DAYS Performed at  [facility], [address], [place], [place] [address]    Report Status PENDING  Incomplete  Culture, blood (Routine X 2) w Reflex to ID Panel     Status: None (Preliminary result)   Collection Time: 11/05/17  6:27 AM  Result Value Ref Range Status   Specimen Description BLOOD RIGHT HAND  Final   Special Requests   Final    BOTTLES DRAWN AEROBIC AND ANAEROBIC Blood Culture adequate volume   Culture   Final    NO GROWTH 4 DAYS Performed at [facility], [address], [place], [place] [address]    Report Status PENDING  Incomplete  Culture, respiratory (NON-Expectorated)     Status: None   Collection Time: 11/05/17  3:26 PM  Result Value Ref Range Status   Specimen Description   Final    TRACHEAL ASPIRATE Performed at [facility], [address] [place], [place] [address]    Special Requests   Final    NONE Performed at [facility], [address] [place], [place] [address]    Gram Stain   Final    FEW WBC PRESENT, PREDOMINANTLY MONONUCLEAR NO ORGANISMS SEEN    Culture   Final    RARE Consistent with normal respiratory flora. Performed at [facility], [place] [address], [place], [place] [address]    Report Status 11/08/2017 FINAL  Final  CSF culture     Status: None (Preliminary result)   Collection Time: 11/06/17 12:35 PM  Result Value Ref Range Status   Specimen Description   Final    CSF Performed at [facility], [address], [place], [place] [address]    Special Requests   Final    NONE Performed at [facility], [place], [place] [address]     Gram Stain NO ORGANISMS SEEN CYTOSPIN SMEAR   Final   Culture   Final    NO GROWTH < 24 HOURS Performed at [facility], [place] [address], [place], [place] [address]    Report Status PENDING  Incomplete  Virus culture     Status: None   Collection Time: 11/06/17 12:35 PM  Result Value Ref Range Status   Viral Culture Comment  Final    Comment: (NOTE) Preliminary Report: No virus isolated at 24 hours. Next report to follow after 4 days. Performed At: [facility] [place], [place] [id number] Rush Farmer MD QQ:[id number]    Source of Sample CSF  Final    Comment: Performed at [facility], [address] [place],  [address]      Scheduled Meds: . Chlorhexidine Gluconate Cloth  6 each Topical Q0600  . enoxaparin (LOVENOX) injection  40 mg Subcutaneous Q24H  . feeding supplement (ENSURE ENLIVE)  237 mL Oral TID BM  . Influenza vac split quadrivalent PF  0.5 mL Intramuscular Tomorrow-1000  . insulin aspart  0-15 Units Subcutaneous TID WC  . insulin aspart  0-5 Units Subcutaneous QHS  . insulin glargine  15 Units Subcutaneous QHS  . lidocaine (PF)  5 mL Other Once  . mouth rinse  15 mL Mouth Rinse BID  . metoprolol tartrate  25 mg Oral BID  . mupirocin ointment  1 application Nasal BID  . nystatin   Topical BID  . pneumococcal 23 valent vaccine  0.5 mL Intramuscular Tomorrow-1000  . potassium chloride  40 mEq Oral Once   Continuous Infusions: . sodium chloride    .  acyclovir Stopped (11/09/17 1601)  . levETIRAcetam Stopped (11/09/17 0807)  . sodium chloride      Assessment/Plan:  1. Possible encephalitis.  Seems to be clinically improving.  Antibiotics discontinued.  Patient still on acyclovir.  Sepsis ruled out with cultures being negative. 2. Acute encephalopathy.  Husband thinks it may be secondary to the psychiatric medications.  Psychiatric medications have been on hold since coming into the hospital.  We will get  psychiatric consultation. 3. Accelerated hypertension tachycardia.  Start low-dose metoprolol. 4. Acute kidney injury improved with IV fluid hydration 5. Hypokalemia order klor.  Try to stop IV fluids. 6. Type 2 diabetes mellitus on Lantus and sliding scale 7. Seizure.  On IV Keppra 8. Weakness.  Physical therapy evaluation  Code Status:     Code Status Orders  (From admission, onward)        Start     Ordered   11/05/17 0220  Full code  Continuous     11/05/17 0220    Code Status History    Date Active Date Inactive Code Status Order ID Comments User Context   12/08/2015 17:50 12/09/2015 19:51 Full Code [id number]  Leonie Green, MD Inpatient     Family Communication: spoke with husband Disposition Plan: TBD  Consultants:  Neurology  Antibiotics:  acyclovir  Time spent: 28 minutes  [place]

## 2017-11-09 NOTE — Progress Notes (Signed)
Upon assessing pt and giving meds, noticed right IJ was removed. Pt stated she "thought it was a necklace and pulled it off." Tip was intact. Stitches removed and pressure applied. MD Saline notified.

## 2017-11-09 NOTE — Consult Note (Signed)
[place] [facility] Consult   Reason for Consult: Consult for 72 year old woman with a complicated past psychiatric history who is currently in the hospital for an episode of altered mental state presumed to be a seizure Referring Physician:  [place] Patient Identification: Yvette Tyler MRN:  [id number] Principal Diagnosis: Generalized anxiety disorder Diagnosis:   Patient Active Problem List   Diagnosis Date Noted  . Generalized anxiety disorder [F41.1] 11/09/2017  . Meningitis [G03.9]   . Hyperglycemic hyperosmolar nonketotic coma ([place]) [E11.01, E11.65]   . Acute encephalopathy [G93.40] 11/04/2017  . HTN (hypertension) [I10] 11/04/2017  . HLD (hyperlipidemia) [E78.5] 11/04/2017  . Diabetes ([place]) [E11.9] 11/04/2017  . Leukocytosis [D72.829] 11/04/2017  . History of stroke [Z86.73] 11/04/2017  . Sepsis ([place]) [A41.9] 11/04/2017  . Primary cancer of upper inner quadrant of left female breast [facility]) [C50.212] 11/13/2015    Total Time spent with patient: 1 hour  Subjective:   Yvette Tyler is a 72 y.o. female patient admitted with "I do not think anyone really knows".  HPI: Patient interviewed.  Husband also present and also providing much information.  Chart reviewed.  72 year old woman came into the hospital with altered mental status.  The description given by her husband is that she had been sick for several days and appeared to be having heaving and vomiting.  He tried to lift her up in the bed and then she "looked dead" with her eyes rolled back in her head biting her tongue.  Reportedly she was agitated and combative and delirious with the EMTs and had to be intubated when she came into the hospital.  Subsequent workup has not found a specific etiology.  LP was negative.  Scans did not show a specific new etiology.  Patient's mental status has improved dramatically.  I asked both the patient and her husband if she is now back to her baseline mental state and both of them  said that she essentially was.  Patient denies being depressed.  Denies suicidal thoughts.  Denies any hallucinations.  She does tell me she is still not sleeping and feels very anxious and bad about her sleep.  She mentions that several times.  We reviewed her psychiatric medicine.  Patient is on a complicated set of psychiatric medicines but it looks like they have been provided for her chronically and she does have a regular outpatient psychiatrist.  We do not have old psychiatric notes in the chart but from what she says it sounds like she has been treated for a combination of depression and anxiety symptoms for years.  She tells me that she is not always 100% compliant with her medicine but she is confused about which ones she might or might not of been taking.  She seems to have been under the impression that Levaquin was incompatible with at least some of her medicines but she is not sure which ones.  Social history: Lives with her husband.  The to have been married for 50 years.  The patient tells me in confidence when her husband is out of the room that they do not get along all that well but she does not suggest any specific complaint.  Medical history: Multiple medical problems.  Diabetes a past history of stroke.  Current altered mental status of unclear etiology.  Had been on a lot of antibiotics this year for various infections.  Apparently as a result had developed a persistent and problematic yeast infection as well.  Substance abuse history: They denied  there ever being any I do not see any history of it  Past Psychiatric History: Again we do not have notes and the patient and her husband are very tangential and it is a little difficult to get the details.  She has been seeing psychiatrist probably for decades.  It sounds like she started taking sedating medicines as an adolescent and has been on a wide variety of them since.  Denies suicidality.  Denies having had inpatient treatment.  She  is able to "for me however the current medicines that she is taking including the Xanax although she says she does not always take it 4 times a day.  Risk to Self: Is patient at risk for suicide?: No Risk to Others:   Prior Inpatient Therapy:   Prior Outpatient Therapy:    Past Medical History:  Past Medical History:  Diagnosis Date  . Anxiety   . Cancer ([place])   . Depression   . Diabetes mellitus without complication ([employer])   . DVT (deep vein thrombosis) in pregnancy ([place])   . Hyperlipidemia   . Hypertension   . Insomnia   . Osteoarthritis   . Rectal bleeding   . Stroke ([place])   . Suicidal ideation     Past Surgical History:  Procedure Laterality Date  . AXILLARY LYMPH NODE DISSECTION Left 12/08/2015   Procedure: AXILLARY LYMPH NODE DISSECTION;  Surgeon: Leonie Green, MD;  Location: [facility] ORS;  Service: General;  Laterality: Left;  POSSIBLE AXILLARY NODE DISSECTION  . BREAST BIOPSY Left 10/27/2015   path pending  . COLONOSCOPY    . MASTECTOMY Left 12/08/2015  . PARTIAL MASTECTOMY WITH AXILLARY SENTINEL LYMPH NODE BIOPSY Left 12/08/2015   Procedure: LEFT MASTECTOMY WITH AXILLARY SENTINEL LYMPH NODE BIOPSY;  Surgeon: Leonie Green, MD;  Location: [facility] ORS;  Service: General;  Laterality: Left;  [place] VEIN BYPASS SURGERY     Family History:  Family History  Problem Relation Age of Onset  . Pancreatic cancer Father 7  . Colon cancer Paternal Uncle   . Pancreatic cancer Unknown   . Heart failure Mother   . Breast cancer Mother    Family Psychiatric  History: Positive for anxiety disorder Social History:  Social History   Substance and Sexual Activity  Alcohol Use No     Social History   Substance and Sexual Activity  Drug Use No    Social History   Socioeconomic History  . Marital status: Married    Spouse name: None  . Number of children: None  . Years of education: None  . Highest education level: None  Social Needs  . Financial resource strain: None   . Food insecurity - worry: None  . Food insecurity - inability: None  . Transportation needs - medical: None  . Transportation needs - non-medical: None  Occupational History  . None  Tobacco Use  . Smoking status: Never Smoker  . Smokeless tobacco: Never Used  Substance and Sexual Activity  . Alcohol use: No  . Drug use: No  . Sexual activity: None  Other Topics Concern  . None  Social History Narrative  . None   Additional Social History:    Allergies:   Allergies  Allergen Reactions  . Hydrocodone-Acetaminophen Other (See Comments)    Increased dream activity  . Meperidine Other (See Comments)    Rapid heart rate  . Penicillins Rash    Has patient had a PCN reaction causing immediate rash, facial/tongue/throat swelling, SOB or lightheadedness  with hypotension: Yes Has patient had a PCN reaction causing severe rash involving mucus membranes or skin necrosis: No Has patient had a PCN reaction that required hospitalization: No Has patient had a PCN reaction occurring within the last 10 years: No If all of the above answers are "NO", then may proceed with Cephalosporin use.     Labs:  Results for orders placed or performed during the hospital encounter of 11/04/17 (from the past 48 hour(s))  Glucose, capillary     Status: Abnormal   Collection Time: 11/08/17 12:03 AM  Result Value Ref Range   Glucose-Capillary 125 (H) 65 - 99 mg/dL  Glucose, capillary     Status: None   Collection Time: 11/08/17  5:03 AM  Result Value Ref Range   Glucose-Capillary 94 65 - 99 mg/dL  Basic metabolic panel     Status: Abnormal   Collection Time: 11/08/17  5:04 AM  Result Value Ref Range   Sodium 142 135 - 145 mmol/L   Potassium 3.4 (L) 3.5 - 5.1 mmol/L   Chloride 104 101 - 111 mmol/L   CO2 31 22 - 32 mmol/L   Glucose, Bld 90 65 - 99 mg/dL   BUN 10 6 - 20 mg/dL   Creatinine, Ser 0.60 0.44 - 1.00 mg/dL   Calcium 8.5 (L) 8.9 - 10.3 mg/dL   GFR calc non Af Amer >60 >60 mL/min    GFR calc Af Amer >60 >60 mL/min    Comment: (NOTE) The eGFR has been calculated using the CKD EPI equation. This calculation has not been validated in all clinical situations. eGFR's persistently <60 mL/min signify possible Chronic Kidney Disease.    Anion gap 7 5 - 15    Comment: Performed at [facility], [address] [place], [place] [address]  CBC     Status: Abnormal   Collection Time: 11/08/17  5:04 AM  Result Value Ref Range   WBC 12.4 (H) 3.6 - 11.0 K/uL   RBC 4.30 3.80 - 5.20 MIL/uL   Hemoglobin 13.1 12.0 - 16.0 g/dL   HCT 38.9 35.0 - 47.0 %   MCV 90.4 80.0 - 100.0 fL   MCH 30.4 26.0 - 34.0 pg   MCHC 33.6 32.0 - 36.0 g/dL   RDW 14.0 11.5 - 14.5 %   Platelets 153 150 - 440 K/uL    Comment: Performed at [facility], [address] [place], [place] [address]  Magnesium     Status: None   Collection Time: 11/08/17  5:04 AM  Result Value Ref Range   Magnesium 1.7 1.7 - 2.4 mg/dL    Comment: Performed at [facility], [address] [place], [place] [address]  Phosphorus     Status: Abnormal   Collection Time: 11/08/17  5:04 AM  Result Value Ref Range   Phosphorus 1.6 (L) 2.5 - 4.6 mg/dL    Comment: Performed at [facility], [address] [place], [place] [address]  Glucose, capillary     Status: Abnormal   Collection Time: 11/08/17  7:30 AM  Result Value Ref Range   Glucose-Capillary 107 (H) 65 - 99 mg/dL  Glucose, capillary     Status: Abnormal   Collection Time: 11/08/17 11:57 AM  Result Value Ref Range   Glucose-Capillary 115 (H) 65 - 99 mg/dL  Glucose, capillary     Status: Abnormal   Collection Time: 11/08/17  6:03 PM  Result Value Ref Range   Glucose-Capillary 168 (H) 65 -  99 mg/dL  Phosphorus     Status: Abnormal   Collection Time: 11/08/17  9:15 PM  Result Value Ref Range   Phosphorus 2.0 (L) 2.5 - 4.6 mg/dL    Comment: Performed at [facility], [address] [place], [place] [address]   Magnesium     Status: None   Collection Time: 11/08/17  9:15 PM  Result Value Ref Range   Magnesium 1.8 1.7 - 2.4 mg/dL    Comment: Performed at [facility], [address] [place], [place] [address]  Potassium     Status: None   Collection Time: 11/08/17  9:15 PM  Result Value Ref Range   Potassium 3.7 3.5 - 5.1 mmol/L    Comment: Performed at [facility], [address] [place], [place] [address]  Glucose, capillary     Status: Abnormal   Collection Time: 11/08/17 10:19 PM  Result Value Ref Range   Glucose-Capillary 145 (H) 65 - 99 mg/dL  Basic metabolic panel     Status: Abnormal   Collection Time: 11/09/17  7:20 AM  Result Value Ref Range   Sodium 136 135 - 145 mmol/L   Potassium 3.4 (L) 3.5 - 5.1 mmol/L   Chloride 97 (L) 101 - 111 mmol/L   CO2 32 22 - 32 mmol/L   Glucose, Bld 175 (H) 65 - 99 mg/dL   BUN 7 6 - 20 mg/dL   Creatinine, Ser 0.55 0.44 - 1.00 mg/dL   Calcium 8.8 (L) 8.9 - 10.3 mg/dL   GFR calc non Af Amer >60 >60 mL/min   GFR calc Af Amer >60 >60 mL/min    Comment: (NOTE) The eGFR has been calculated using the CKD EPI equation. This calculation has not been validated in all clinical situations. eGFR's persistently <60 mL/min signify possible Chronic Kidney Disease.    Anion gap 7 5 - 15    Comment: Performed at [facility], [address] [place], [place] [address]  Phosphorus     Status: Abnormal   Collection Time: 11/09/17  7:20 AM  Result Value Ref Range   Phosphorus 2.3 (L) 2.5 - 4.6 mg/dL    Comment: Performed at [facility], [address] [place],  [address]  Glucose, capillary     Status: Abnormal   Collection Time: 11/09/17  8:50 AM  Result Value Ref Range   Glucose-Capillary 181 (H) 65 - 99 mg/dL  Glucose, capillary     Status: Abnormal   Collection Time: 11/09/17 10:35 AM  Result Value Ref Range   Glucose-Capillary 199 (H) 65 - 99 mg/dL   Comment 1 Notify RN   Glucose, capillary      Status: Abnormal   Collection Time: 11/09/17 12:52 PM  Result Value Ref Range   Glucose-Capillary 171 (H) 65 - 99 mg/dL   Comment 1 Notify RN   Glucose, capillary     Status: Abnormal   Collection Time: 11/09/17  5:34 PM  Result Value Ref Range   Glucose-Capillary 165 (H) 65 - 99 mg/dL    Current Facility-Administered Medications  Medication Dose Route Frequency Provider Last Rate Last Dose  . 0.9 %  sodium chloride infusion  250 mL Intravenous PRN Lance Coon, MD      . acetaminophen (TYLENOL) tablet 650 mg  650 mg Oral Q4H PRN Lance Coon, MD      . ALPRAZolam Duanne Moron) tablet 0.5 mg  0.5 mg Oral Q6H PRN Kerrin Markman, Madie Reno, MD      .  amLODipine (NORVASC) tablet 5 mg  5 mg Oral Daily Loletha Grayer, MD   5 mg at 11/09/17 1900  . Chlorhexidine Gluconate Cloth 2 % PADS 6 each  6 each Topical Q0600 Gouru, Aruna, MD   6 each at 11/08/17 0500  . enoxaparin (LOVENOX) injection 40 mg  40 mg Subcutaneous Q24H Lance Coon, MD   40 mg at 11/08/17 2005  . feeding supplement (ENSURE ENLIVE) (ENSURE ENLIVE) liquid 237 mL  237 mL Oral TID BM Wilhelmina Mcardle, MD   237 mL at 11/09/17 1519  . hydrALAZINE (APRESOLINE) injection 10 mg  10 mg Intravenous Q4H PRN Wieting, Richard, MD      . imipramine (TOFRANIL) tablet 100 mg  100 mg Oral QHS Curley Hogen T, MD      . insulin aspart (novoLOG) injection 0-15 Units  0-15 Units Subcutaneous TID WC Wilhelmina Mcardle, MD   3 Units at 11/09/17 1806  . insulin aspart (novoLOG) injection 0-5 Units  0-5 Units Subcutaneous QHS Wilhelmina Mcardle, MD      . insulin glargine (LANTUS) injection 15 Units  15 Units Subcutaneous QHS Wilhelmina Mcardle, MD   15 Units at 11/08/17 2225  . ipratropium-albuterol (DUONEB) 0.5-2.5 (3) MG/3ML nebulizer solution 3 mL  3 mL Nebulization Q4H PRN Wilhelmina Mcardle, MD      . Derrill Memo ON 11/10/2017] levETIRAcetam (KEPPRA) 500 mg in sodium chloride 0.9 % 100 mL IVPB  500 mg Intravenous Daily Leotis Pain, MD      . lidocaine (PF)  (XYLOCAINE) 1 % injection 5 mL  5 mL Other Once Conforti, Pardeep Pautz, DO      . MEDLINE mouth rinse  15 mL Mouth Rinse BID Wilhelmina Mcardle, MD   15 mL at 11/09/17 1127  . metoprolol tartrate (LOPRESSOR) injection 2.5-5 mg  2.5-5 mg Intravenous Q3H PRN Wilhelmina Mcardle, MD      . metoprolol tartrate (LOPRESSOR) tablet 25 mg  25 mg Oral BID Loletha Grayer, MD   25 mg at 11/09/17 0910  . mupirocin ointment (BACTROBAN) 2 % 1 application  1 application Nasal BID Nicholes Mango, MD   1 application at 97/94/80 1224  . nystatin (MYCOSTATIN/NYSTOP) topical powder   Topical BID Dorene Sorrow S, NP      . ondansetron (ZOFRAN) injection 4 mg  4 mg Intravenous Q6H PRN Lance Coon, MD   4 mg at 11/09/17 1222  . PARoxetine (PAXIL) tablet 40 mg  40 mg Oral Daily Nickolis Diel T, MD      . QUEtiapine (SEROQUEL) tablet 300 mg  300 mg Oral QHS Chukwudi Ewen T, MD      . sodium chloride 0.9 % bolus 1,000 mL  1,000 mL Intravenous PRN Lance Coon, MD      . valACYclovir (VALTREX) tablet 1,000 mg  1,000 mg Oral TID Loletha Grayer, MD        Musculoskeletal: Strength & Muscle Tone: decreased Gait & Station: unsteady Patient leans: N/A  Psychiatric Specialty Exam: Physical Exam  Nursing note and vitals reviewed. Constitutional: She appears well-developed and well-nourished.  HENT:  Head: Normocephalic and atraumatic.  Eyes: Conjunctivae are normal. Pupils are equal, round, and reactive to light.  Neck: Normal range of motion.  Cardiovascular: Regular rhythm and normal heart sounds.  Respiratory: Effort normal. No respiratory distress.  GI: Soft.  Musculoskeletal: Normal range of motion.  Neurological: She is alert.  Skin: Skin is warm and dry.  Psychiatric: Judgment normal. Her mood appears anxious. Her speech  is tangential. She is not agitated, not aggressive, not hyperactive and not combative. Thought content is not paranoid. She expresses no homicidal and no suicidal ideation.    Review of Systems   Constitutional: Positive for chills and malaise/fatigue.  HENT: Negative.   Eyes: Negative.   Respiratory: Negative.   Cardiovascular: Negative.   Gastrointestinal: Positive for nausea.  Musculoskeletal: Negative.   Skin: Negative.   Neurological: Positive for weakness.  Psychiatric/Behavioral: Positive for memory loss. Negative for depression, hallucinations, substance abuse and suicidal ideas. The patient is nervous/anxious and has insomnia.     Blood pressure (!) 195/97, pulse (!) 101, temperature 99.1 F (37.3 C), temperature source Oral, resp. rate 16, height 5' 7"  (1.702 m), weight 54.4 kg (120 lb), SpO2 93 %.Body mass index is 18.79 kg/m.  General Appearance: Casual  Eye Contact:  Fair  Speech:  Slow  Volume:  Decreased  Mood:  Anxious and Dysphoric  Affect:  Appropriate  Thought Process:  Disorganized and Irrelevant  Orientation:  Full (Time, Place, and Person)  Thought Content:  Rumination and Tangential  Suicidal Thoughts:  No  Homicidal Thoughts:  No  Memory:  Immediate;   Fair Recent;   Fair Remote;   Fair  Judgement:  Fair  Insight:  Fair  Psychomotor Activity:  Decreased  Concentration:  Concentration: Fair  Recall:  [employer] of Knowledge:  Fair  Language:  Fair  Akathisia:  No  Handed:  Right  AIMS (if indicated):     Assets:  Desire for Improvement Housing Resilience Social Support  ADL's:  Intact  Cognition:  Impaired,  Mild  Sleep:        Treatment Plan Summary: Daily contact with patient to assess and evaluate symptoms and progress in treatment, Medication management and Plan 72 year old woman who has long been on a combination of medicines including Paxil, imipramine, Xanax, Restoril and Seroquel.  All of her medicines have been held while she is in the hospital.  Patient complains very clearly to me about sleep and begs to be able to sleep again.  It sounds like she is not always 100% compliant with her medicine but for the most part stays on  the one she has been on chronically and just uses the Xanax as needed.  I am going to restart the Seroquel, Paxil, imipramine and the as needed Xanax.  We will hold off on the Restoril for the moment to make sure she is not oversedated.  I will reevaluate and see her tomorrow.  No recommendation about any other change.  Does not need psychiatric hospitalization.  Disposition: No evidence of imminent risk to self or others at present.   Patient does not meet criteria for psychiatric inpatient admission. Supportive therapy provided about ongoing stressors.  Alethia Berthold, MD 11/09/2017 8:16 PM

## 2017-11-09 NOTE — NC FL2 (Signed)
[place] LEVEL OF CARE SCREENING TOOL     IDENTIFICATION  Patient Name: Yvette Tyler Birthdate: Oct 11, 1946 Sex: female Admission Date (Current Location): 11/04/2017  [place] and [place] Number:  Engineering geologist and Address:  [facility], [address], [place], [place] [address]      Provider Number: [id number]  Attending Physician Name and Address:  Loletha Grayer, MD  Relative Name and Phone Number:       Current Level of Care: Hospital Recommended Level of Care: [place] Prior Approval Number:    Date Approved/Denied:   PASRR Number: [id number])  Discharge Plan: SNF    Current Diagnoses: Patient Active Problem List   Diagnosis Date Noted  . Meningitis   . Hyperglycemic hyperosmolar nonketotic coma ([place])   . Acute encephalopathy 11/04/2017  . HTN (hypertension) 11/04/2017  . HLD (hyperlipidemia) 11/04/2017  . Diabetes ([place]) 11/04/2017  . Leukocytosis 11/04/2017  . History of stroke 11/04/2017  . Sepsis ([place]) 11/04/2017  . Primary cancer of upper inner quadrant of left female breast ([place]) 11/13/2015    Orientation RESPIRATION BLADDER Height & Weight     Self, Situation, Place  Normal Continent Weight: 120 lb (54.4 kg) Height:  5\' 7"  (170.2 cm)  BEHAVIORAL SYMPTOMS/MOOD NEUROLOGICAL BOWEL NUTRITION STATUS      Continent Diet(Dysphagia 1)  AMBULATORY STATUS COMMUNICATION OF NEEDS Skin   Extensive Assist Verbally Normal                       Personal Care Assistance Level of Assistance  Bathing, Feeding, Dressing Bathing Assistance: Limited assistance Feeding assistance: Independent Dressing Assistance: Limited assistance     Functional Limitations Info  Sight, Hearing, Speech Sight Info: Adequate Hearing Info: Adequate Speech Info: Adequate    SPECIAL CARE FACTORS FREQUENCY  PT (By licensed PT), OT (By licensed OT)     PT Frequency: (5) OT Frequency: (5)             Contractures      Additional Factors Info  Code Status, Allergies, Isolation Precautions Code Status Info: (Full Code) Allergies Info: (HYDROCODONE-ACETAMINOPHEN, MEPERIDINE, PENICILLINS )     Isolation Precautions Info: (Nasal Swab)     Current Medications (11/09/2017):  This is the current hospital active medication list Current Facility-Administered Medications  Medication Dose Route Frequency Provider Last Rate Last Dose  . 0.9 %  sodium chloride infusion  250 mL Intravenous PRN Lance Coon, MD      . acetaminophen (TYLENOL) tablet 650 mg  650 mg Oral Q4H PRN Lance Coon, MD      . acyclovir (ZOVIRAX) 780 mg in dextrose 5 % 150 mL IVPB  10 mg/kg Intravenous Q8H Mikael Spray, NP   Stopped at 11/09/17 0807  . Chlorhexidine Gluconate Cloth 2 % PADS 6 each  6 each Topical Q0600 Gouru, Aruna, MD   6 each at 11/08/17 0500  . enoxaparin (LOVENOX) injection 40 mg  40 mg Subcutaneous Q24H Lance Coon, MD   40 mg at 11/08/17 2005  . feeding supplement (ENSURE ENLIVE) (ENSURE ENLIVE) liquid 237 mL  237 mL Oral TID BM Wilhelmina Mcardle, MD   Stopped at 11/08/17 1400  . hydrALAZINE (APRESOLINE) injection 10-40 mg  10-40 mg Intravenous Q4H PRN Wilhelmina Mcardle, MD      . Influenza vac split quadrivalent PF (FLUZONE HIGH-DOSE) injection 0.5 mL  0.5 mL Intramuscular Tomorrow-1000 Awilda Bill, NP      .  insulin aspart (novoLOG) injection 0-15 Units  0-15 Units Subcutaneous TID WC Wilhelmina Mcardle, MD   3 Units at 11/09/17 0910  . insulin aspart (novoLOG) injection 0-5 Units  0-5 Units Subcutaneous QHS Wilhelmina Mcardle, MD      . insulin glargine (LANTUS) injection 15 Units  15 Units Subcutaneous QHS Wilhelmina Mcardle, MD   15 Units at 11/08/17 2225  . ipratropium-albuterol (DUONEB) 0.5-2.5 (3) MG/3ML nebulizer solution 3 mL  3 mL Nebulization Q4H PRN Wilhelmina Mcardle, MD      . levETIRAcetam (KEPPRA) 500 mg in sodium chloride 0.9 % 100 mL IVPB  500 mg Intravenous Q12H Leotis Pain, MD   Stopped at 11/09/17 [phone]  . lidocaine (PF) (XYLOCAINE) 1 % injection 5 mL  5 mL Other Once Conforti, John, DO      . MEDLINE mouth rinse  15 mL Mouth Rinse BID Wilhelmina Mcardle, MD   15 mL at 11/08/17 2305  . metoprolol tartrate (LOPRESSOR) injection 2.5-5 mg  2.5-5 mg Intravenous Q3H PRN Wilhelmina Mcardle, MD      . metoprolol tartrate (LOPRESSOR) tablet 25 mg  25 mg Oral BID Loletha Grayer, MD   25 mg at 11/09/17 0910  . mupirocin ointment (BACTROBAN) 2 % 1 application  1 application Nasal BID Gouru, Aruna, MD   1 application at 79/39/03 1100  . nystatin (MYCOSTATIN/NYSTOP) topical powder   Topical BID Dorene Sorrow S, NP      . ondansetron (ZOFRAN) injection 4 mg  4 mg Intravenous Q6H PRN Lance Coon, MD   4 mg at 11/08/17 2223  . sodium chloride 0.9 % bolus 1,000 mL  1,000 mL Intravenous PRN Lance Coon, MD         Discharge Medications: Please see discharge summary for a list of discharge medications.  Relevant Imaging Results:  Relevant Lab Results:   Additional Information (SSN: [id number])  Smith Mince, Student-Social Work

## 2017-11-09 NOTE — Consult Note (Signed)
Much more awake and pt is following commands.  No seizure activity    Past Medical History:  Diagnosis Date  . Anxiety   . Cancer ([place])   . Depression   . Diabetes mellitus without complication ([place])   . DVT (deep vein thrombosis) in pregnancy ([place])   . Hyperlipidemia   . Hypertension   . Insomnia   . Osteoarthritis   . Rectal bleeding   . Stroke ([place])   . Suicidal ideation     Past Surgical History:  Procedure Laterality Date  . AXILLARY LYMPH NODE DISSECTION Left 12/08/2015   Procedure: AXILLARY LYMPH NODE DISSECTION;  Surgeon: Leonie Green, MD;  Location: [facility] ORS;  Service: General;  Laterality: Left;  POSSIBLE AXILLARY NODE DISSECTION  . BREAST BIOPSY Left 10/27/2015   path pending  . COLONOSCOPY    . MASTECTOMY Left 12/08/2015  . PARTIAL MASTECTOMY WITH AXILLARY SENTINEL LYMPH NODE BIOPSY Left 12/08/2015   Procedure: LEFT MASTECTOMY WITH AXILLARY SENTINEL LYMPH NODE BIOPSY;  Surgeon: Leonie Green, MD;  Location: [facility] ORS;  Service: General;  Laterality: Left;  Marland Kitchen VEIN BYPASS SURGERY      Family History  Problem Relation Age of Onset  . Pancreatic cancer Father 63  . Colon cancer Paternal Uncle   . Pancreatic cancer Unknown   . Heart failure Mother   . Breast cancer Mother     Social History:  reports that  has never smoked. she has never used smokeless tobacco. She reports that she does not drink alcohol or use drugs.  Allergies  Allergen Reactions  . Hydrocodone-Acetaminophen Other (See Comments)    Increased dream activity  . Meperidine Other (See Comments)    Rapid heart rate  . Penicillins Rash    Has patient had a PCN reaction causing immediate rash, facial/tongue/throat swelling, SOB or lightheadedness with hypotension: Yes Has patient had a PCN reaction causing severe rash involving mucus membranes or skin necrosis: No Has patient had a PCN reaction that required hospitalization: No Has patient had a PCN reaction occurring within the last 10  years: No If all of the above answers are "NO", then may proceed with Cephalosporin use.     Medications: I have reviewed the patient's current medications.  ROS: Unable to obtain due to intubation and sedation   Physical Examination: Blood pressure (!) 179/82, pulse (!) 112, temperature 98.9 F (37.2 C), temperature source Oral, resp. rate 16, height 5\' 7"  (1.702 m), weight 120 lb (54.4 kg), SpO2 93 %. Mental Status: Alert, able to tell me name  Cranial Nerves: II: Discs flat bilaterally; Visual fields grossly normal, pupils equal, round, reactive to light and accommodation III,IV, VI: ptosis not present, extra-ocular motions intact bilaterally V,VII: smile symmetric, facial light touch sensation normal bilaterally VIII: hearing normal bilaterally IX,X: gag reflex present XI: bilateral shoulder shrug XII: midline tongue extension Motor: Right : Upper extremity   4/5    Left:     Upper extremity   4/5  Lower extremity   4/5     Lower extremity   4/5 Tone and bulk:normal tone throughout; no atrophy noted Sensory: Pinprick and light touch intact throughout, bilaterally Deep Tendon Reflexes: 1+ and symmetric throughout Plantars: Right: downgoing   Left: downgoing Cerebellar: Not tested Gait: not tested       Laboratory Studies:   Basic Metabolic Panel: Recent Labs  Lab 11/05/17 0254 11/06/17 0431 11/07/17 0415 11/08/17 0504 11/08/17 2115 11/09/17 0720  NA 139 139 147* 142  --  136  K 4.0 2.7* 3.9 3.4* 3.7 3.4*  CL 112* 104 115* 104  --  97*  CO2 18* 23 27 31   --  32  GLUCOSE 452* 418* 169* 90  --  175*  BUN 14 19 15 10   --  7  CREATININE 0.90 0.74 0.68 0.60  --  0.55  CALCIUM 7.9* 7.7* 8.1* 8.5*  --  8.8*  MG 2.0 1.8  --  1.7 1.8  --   PHOS 3.6 2.8  --  1.6* 2.0* 2.3*    Liver Function Tests: Recent Labs  Lab 11/04/17 1700 11/05/17 0254 11/07/17 0415  AST 38 27 22  ALT 27 23 22   ALKPHOS 113 84 56  BILITOT 1.1 1.2 0.2*  PROT 7.7 6.0* 4.8*  ALBUMIN  3.8 3.1* 2.4*   No results for input(s): LIPASE, AMYLASE in the last 168 hours. Recent Labs  Lab 11/04/17 1701 11/05/17 0254  AMMONIA 14 17    CBC: Recent Labs  Lab 11/04/17 1700 11/05/17 0254 11/06/17 0600 11/07/17 0415 11/08/17 0504  WBC 16.5* 11.6* 13.2* 12.7* 12.4*  NEUTROABS 14.4*  --   --   --   --   HGB 15.6 13.4 12.2 11.5* 13.1  HCT 46.9 40.3 36.3 33.6* 38.9  MCV 89.9 90.9 89.8 89.6 90.4  PLT 235 203 170 148* 153    Cardiac Enzymes: Recent Labs  Lab 11/04/17 1700 11/05/17 0254 11/05/17 0900 11/05/17 1412  TROPONINI <0.03 0.03* <0.03 <0.03    BNP: Invalid input(s): POCBNP  CBG: Recent Labs  Lab 11/08/17 1157 11/08/17 1803 11/08/17 2219 11/09/17 0850 11/09/17 1035  GLUCAP 115* 168* 145* 181* 199*    Microbiology: Results for orders placed or performed during the hospital encounter of 11/04/17  Urine Culture     Status: None   Collection Time: 11/04/17  5:02 PM  Result Value Ref Range Status   Specimen Description   Final    URINE, RANDOM Performed at [facility], [address] [place], [place] [address]    Special Requests   Final    NONE Performed at [facility], [address], [place], [place] [address]    Culture   Final    NO GROWTH Performed at [facility], [place] [address], [place], [place] [address]    Report Status 11/06/2017 FINAL  Final  Respiratory Panel by PCR     Status: None   Collection Time: 11/04/17  5:54 PM  Result Value Ref Range Status   Adenovirus NOT DETECTED NOT DETECTED Final   Coronavirus 229E NOT DETECTED NOT DETECTED Final   Coronavirus HKU1 NOT DETECTED NOT DETECTED Final   Coronavirus NL63 NOT DETECTED NOT DETECTED Final   Coronavirus OC43 NOT DETECTED NOT DETECTED Final   Metapneumovirus NOT DETECTED NOT DETECTED Final   Rhinovirus / Enterovirus NOT DETECTED NOT DETECTED Final   Influenza A NOT DETECTED NOT DETECTED Final   Influenza B NOT DETECTED NOT DETECTED Final    Parainfluenza Virus 1 NOT DETECTED NOT DETECTED Final   Parainfluenza Virus 2 NOT DETECTED NOT DETECTED Final   Parainfluenza Virus 3 NOT DETECTED NOT DETECTED Final   Parainfluenza Virus 4 NOT DETECTED NOT DETECTED Final   Respiratory Syncytial Virus NOT DETECTED NOT DETECTED Final   Bordetella pertussis NOT DETECTED NOT DETECTED Final   Chlamydophila pneumoniae NOT DETECTED NOT DETECTED Final   Mycoplasma pneumoniae NOT DETECTED NOT DETECTED Final  MRSA PCR Screening     Status: Abnormal  Collection Time: 11/05/17  2:25 AM  Result Value Ref Range Status   MRSA by PCR POSITIVE (A) NEGATIVE Final    Comment:        The GeneXpert MRSA Assay (FDA approved for NASAL specimens only), is one component of a comprehensive MRSA colonization surveillance program. It is not intended to diagnose MRSA infection nor to guide or monitor treatment for MRSA infections. RESULT CALLED TO, READ BACK BY AND VERIFIED WITH: ALECIA RAWLINS AT 5093 ON 11/05/17 RWW Performed at [facility], [address] [place], [place] [address]   Culture, blood (Routine X 2) w Reflex to ID Panel     Status: None (Preliminary result)   Collection Time: 11/05/17  6:27 AM  Result Value Ref Range Status   Specimen Description BLOOD RIGHT WRIST  Final   Special Requests   Final    BOTTLES DRAWN AEROBIC AND ANAEROBIC Blood Culture adequate volume   Culture   Final    NO GROWTH 4 DAYS Performed at [facility], [address], [place], [place] [address]    Report Status PENDING  Incomplete  Culture, blood (Routine X 2) w Reflex to ID Panel     Status: None (Preliminary result)   Collection Time: 11/05/17  6:27 AM  Result Value Ref Range Status   Specimen Description BLOOD RIGHT HAND  Final   Special Requests   Final    BOTTLES DRAWN AEROBIC AND ANAEROBIC Blood Culture adequate volume   Culture   Final    NO GROWTH 4 DAYS Performed at [facility], [address] [place], [place]  [address]    Report Status PENDING  Incomplete  Culture, respiratory (NON-Expectorated)     Status: None   Collection Time: 11/05/17  3:26 PM  Result Value Ref Range Status   Specimen Description   Final    TRACHEAL ASPIRATE Performed at [facility], [address] [place], [place] [address]    Special Requests   Final    NONE Performed at [facility], [address] [place], [place] [address]    Gram Stain   Final    FEW WBC PRESENT, PREDOMINANTLY MONONUCLEAR NO ORGANISMS SEEN    Culture   Final    RARE Consistent with normal respiratory flora. Performed at [facility], [place] [address] [place], [place] [address]    Report Status 11/08/2017 FINAL  Final  CSF culture     Status: None (Preliminary result)   Collection Time: 11/06/17 12:35 PM  Result Value Ref Range Status   Specimen Description   Final    CSF Performed at [facility], [address], [place], [place] [address]    Special Requests   Final    NONE Performed at [facility], [place], [place] [address]    Gram Stain NO ORGANISMS SEEN CYTOSPIN SMEAR   Final   Culture   Final    NO GROWTH 2 DAYS Performed at [facility], [place] [address], [place],  [address]    Report Status PENDING  Incomplete  Virus culture     Status: None   Collection Time: 11/06/17 12:35 PM  Result Value Ref Range Status   Viral Culture Comment  Final    Comment: (NOTE) Preliminary Report: No virus isolated at 24 hours. Next report to follow after 4 days. Performed At: [facility] [place], [place] [id number] Rush Farmer MD IW:[id number]  Source of Sample CSF  Final    Comment: Performed at [facility], [address] [place], [place] [address]    Coagulation Studies: No results for input(s): LABPROT, INR in the last 72 hours.  Urinalysis:  Recent Labs  Lab 11/04/17 1702  COLORURINE STRAW*  LABSPEC 1.023   PHURINE 6.0  GLUCOSEU >=500*  HGBUR SMALL*  BILIRUBINUR NEGATIVE  KETONESUR 20*  PROTEINUR 30*  NITRITE NEGATIVE  LEUKOCYTESUR NEGATIVE    Lipid Panel:     Component Value Date/Time   CHOL 197 10/13/2012 0557   TRIG 77 11/06/2017 2058   TRIG 237 (H) 10/13/2012 0557   HDL 29 (L) 10/13/2012 0557   VLDL 47 (H) 10/13/2012 0557   LDLCALC 121 (H) 10/13/2012 0557    HgbA1C:  Lab Results  Component Value Date   HGBA1C 11.1 (H) 10/13/2012    Urine Drug Screen:      Component Value Date/Time   LABOPIA NONE DETECTED 11/04/2017 1702   COCAINSCRNUR NONE DETECTED 11/04/2017 1702   LABBENZ POSITIVE (A) 11/04/2017 1702   AMPHETMU NONE DETECTED 11/04/2017 1702   THCU NONE DETECTED 11/04/2017 1702   LABBARB NONE DETECTED 11/04/2017 1702    Alcohol Level: No results for input(s): ETH in the last 168 hours.  Other results:  Imaging: No results found.   Assessment/Plan:  72 y.o. female  who presents with altered mental status.  Patient is unable to contribute information to her HPI given that she is intubated and sedated.  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 S/p LP and resolved    Seizure in setting of infection.   Acyclovir can be switched orally and finish 7 day course Keppra switch to 500mg  today for 1 more dose and then d/c No seizures on EEG D/c planning    11/09/2017, 10:59 AM

## 2017-11-09 NOTE — Progress Notes (Signed)
Pt's BP 195/97. MD [place] notified. New orders for amlodipine placed.

## 2017-11-09 NOTE — Evaluation (Signed)
Physical Therapy Evaluation Patient Details Name: Yvette Tyler MRN: [id number] DOB: September 25, 1946 Today's Date: 11/09/2017   History of Present Illness  Pt is a 72 y.o. female with a history of breast cancer, DM2, HTN, HLD, CVA who presents 11/04/17 for evaluation of altered mental status.  According to the patient's husband patient took fluconazole 2 days ago for an yeast infection. She has been having 2 days of nausea and several episodes of  NBNB emesis. Today she took zofran and went in the shower, when she came out, she started to vomit again. Huband assisted patient to bed and went to grab her ginger ale. When he came back and she was unresponsive and had her eyes rolled up and her jaw clenched. She then started to speak nonsensically, became very agitated and combative which prompted him to call EMS. Per EMS, patient was incoherent, combative, agitated requiring multiple rounds of sedation with no changes in her mental status. Patient arrived to ED altered, nonsensical words, combative, agitated.  Pt intubated 11/04/17 and extubated 11/07/17.  Admitted with acute encephalopathy, possible encephalitis, accelerated htn tachycardia, AKI, hypokalemia, seizure.  Clinical Impression  Prior to hospital admission, pt appears to have been ambulatory.  Pt lives with her husband.  Currently pt is mod assist supine to sit; mod to max assist with transfers; and min to mod assist to ambulate a few feet with RW.  After getting to chair, pt with nausea and emesis (nursing present and aware).  Pt demonstrating overall generalized weakness and confusion during session.  Pt would benefit from skilled PT to address noted impairments and functional limitations (see below for any additional details).  Upon hospital discharge, recommend pt discharge to [facility].    Follow Up Recommendations SNF    Equipment Recommendations  Rolling walker with 5" wheels    Recommendations for Other Services       Precautions /  Restrictions Precautions Precautions: Fall Precaution Comments: Aspiration Restrictions Weight Bearing Restrictions: No      Mobility  Bed Mobility Overal bed mobility: Needs Assistance Bed Mobility: Supine to Sit     Supine to sit: Mod assist;HOB elevated     General bed mobility comments: assist for trunk supine to sit  Transfers Overall transfer level: Needs assistance Equipment used: Rolling walker (2 wheeled) Transfers: Sit to/from Stand Sit to Stand: Mod assist;Max assist         General transfer comment: 1st trial pt requiring max assist to stand with RW and 2nd attempt mod assist standing with RW; vc's for hand and LE placement required; assist to initiate stand and come to upright posture  Ambulation/Gait Ambulation/Gait assistance: Min assist;Mod assist Ambulation Distance (Feet): 3 Feet(bed to recliner) Assistive device: Rolling walker (2 wheeled)   Gait velocity: decreased   General Gait Details: assist to steady with RW; decreased B step length/foot clearance/heelstrike  Stairs            Wheelchair Mobility    Modified Rankin (Stroke Patients Only)       Balance Overall balance assessment: Needs assistance Sitting-balance support: Single extremity supported;Feet supported Sitting balance-Leahy Scale: Poor Sitting balance - Comments: requires at least single UE support for static sitting balance   Standing balance support: Bilateral upper extremity supported Standing balance-Leahy Scale: Poor Standing balance comment: requires B UE support on RW for static standing balance  Pertinent Vitals/Pain Pain Assessment: No/denies pain  Vitals (HR and O2 on room air) stable and WFL throughout treatment session.    Home Living Family/patient expects to be discharged to:: Private residence Living Arrangements: Spouse/significant other Available Help at Discharge: Family Type of Home: House Home Access:  Stairs to enter Entrance Stairs-Rails: None Entrance Stairs-Number of Steps: 2 Home Layout: One level Home Equipment: Environmental consultant - 4 wheels      Prior Function           Comments: Pt reports a couple recent falls and that she does not use AD to ambulate with in home (holds onto furniture) and uses 4ww in community (no family present to verify information)     Hand Dominance        Extremity/Trunk Assessment   Upper Extremity Assessment Upper Extremity Assessment: Generalized weakness    Lower Extremity Assessment Lower Extremity Assessment: Generalized weakness    Cervical / Trunk Assessment Cervical / Trunk Assessment: Normal  Communication   Communication: No difficulties  Cognition Arousal/Alertness: Awake/alert Behavior During Therapy: WFL for tasks assessed/performed Overall Cognitive Status: (Oriented to person and place but not time or situation.)                                 General Comments: Increased time to answer questions and tends to be verbose, tangential, and non-direct with answers.      General Comments General comments (skin integrity, edema, etc.): Pt resting in bed upon PT arrival.  Nursing cleared pt for participation in physical therapy.  Pt agreeable to PT session.    Exercises     Assessment/Plan    PT Assessment Patient needs continued PT services  PT Problem List Decreased strength;Decreased activity tolerance;Decreased balance;Decreased mobility;Decreased knowledge of use of DME;Decreased knowledge of precautions       PT Treatment Interventions DME instruction;Gait training;Stair training;Functional mobility training;Therapeutic activities;Therapeutic exercise;Balance training;Patient/family education    PT Goals (Current goals can be found in the Care Plan section)  Acute Rehab PT Goals Patient Stated Goal: to improve strength and mobility PT Goal Formulation: With patient Time For Goal Achievement:  11/23/17 Potential to Achieve Goals: Good    Frequency Min 2X/week   Barriers to discharge Decreased caregiver support      Co-evaluation               AM-PAC PT "6 Clicks" Daily Activity  Outcome Measure Difficulty turning over in bed (including adjusting bedclothes, sheets and blankets)?: Unable Difficulty moving from lying on back to sitting on the side of the bed? : Unable   Help needed moving to and from a bed to chair (including a wheelchair)?: A Lot Help needed walking in hospital room?: A Lot Help needed climbing 3-5 steps with a railing? : Total 6 Click Score: 7    End of Session Equipment Utilized During Treatment: Gait belt Activity Tolerance: Patient limited by fatigue;Other (comment)(Limited d/t nausea) Patient left: in chair;with call bell/phone within reach;with chair alarm set;with nursing/sitter in room Nurse Communication: Mobility status;Precautions PT Visit Diagnosis: Other abnormalities of gait and mobility (R26.89);Muscle weakness (generalized) (M62.81);History of falling (Z91.81)    Time: 3300-7622 PT Time Calculation (min) (ACUTE ONLY): 35 min   Charges:   PT Evaluation $PT Eval Low Complexity: 1 Low PT Treatments $Therapeutic Activity: 8-22 mins   PT G CodesLeitha Bleak, PT 11/09/17, 5:08 PM [phone]

## 2017-11-10 DIAGNOSIS — F411 Generalized anxiety disorder: Secondary | ICD-10-CM

## 2017-11-10 LAB — GLUCOSE, CAPILLARY
GLUCOSE-CAPILLARY: 61 mg/dL — AB (ref 65–99)
GLUCOSE-CAPILLARY: 109 mg/dL — AB (ref 65–99)
Glucose-Capillary: 76 mg/dL (ref 65–99)
Glucose-Capillary: 301 mg/dL — ABNORMAL HIGH (ref 65–99)
Glucose-Capillary: 274 mg/dL — ABNORMAL HIGH (ref 65–99)

## 2017-11-10 LAB — CULTURE, BLOOD (ROUTINE X 2)
Culture: NO GROWTH
Culture: NO GROWTH
SPECIAL REQUESTS: ADEQUATE
SPECIAL REQUESTS: ADEQUATE

## 2017-11-10 MED ORDER — METOPROLOL TARTRATE 25 MG PO TABS
25.0000 mg | ORAL_TABLET | Freq: Two times a day (BID) | ORAL | Status: DC
  Administered 2017-11-10 – 2017-11-13 (×6): 25 mg via ORAL
  Filled 2017-11-10 (×6): qty 1

## 2017-11-10 MED ORDER — POTASSIUM CHLORIDE 20 MEQ PO PACK
40.0000 meq | PACK | Freq: Once | ORAL | Status: AC
  Administered 2017-11-10: 40 meq via ORAL
  Filled 2017-11-10: qty 2

## 2017-11-10 NOTE — Progress Notes (Signed)
Clinical Education officer, museum (CSW) attempted to meet with patient to discuss her D/C plan however she was confused. CSW contacted patient's son [name] and made him aware that PT is recommending SNF. [name] is agreeable to SNF search in [place]. CSW presented bed offers to [facility]. Per [name] he will go tour the 2 offers [facility] and get back with CSW.   [employer], LCSW [phone]

## 2017-11-10 NOTE — Progress Notes (Signed)
Hypoglycemic Event  CBG: 61  Treatment: 8 oz. Orange juice   Symptoms: asymptomatic   Follow-up CBG: Time:0900 CBG Result:76  Possible Reasons for Event: Pt did not eat last night  Comments/MD notified:Notified MD [place]     Maricela Bo Saryna Kneeland

## 2017-11-10 NOTE — Consult Note (Signed)
[place] [facility] Consult   Reason for Consult: Follow-up consult for 72 year old woman with a history of past mental health issues Referring Physician:  [place] Patient Identification: Yvette Tyler MRN:  [id number] Principal Diagnosis: Generalized anxiety disorder Diagnosis:   Patient Active Problem List   Diagnosis Date Noted  . Generalized anxiety disorder [F41.1] 11/09/2017  . Meningitis [G03.9]   . Hyperglycemic hyperosmolar nonketotic coma ([place]) [E11.01, E11.65]   . Acute encephalopathy [G93.40] 11/04/2017  . HTN (hypertension) [I10] 11/04/2017  . HLD (hyperlipidemia) [E78.5] 11/04/2017  . Diabetes ([place]) [E11.9] 11/04/2017  . Leukocytosis [D72.829] 11/04/2017  . History of stroke [Z86.73] 11/04/2017  . Sepsis ([place]) [A41.9] 11/04/2017  . 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 female patient admitted with "I am being mean".  HPI: Patient seen chart reviewed.  Also got to speak to her son who is present.  Patient was awake when I came to see her this evening.  Very interactive.  She had a lot to say.  A lot of complaints only some of them coherent.  Tending to repeat herself a lot.  She does not however seem to be delirious.  She is alert and oriented but at times seems to be a little confused.  Certainly not oversedated.  She tells me she still did not sleep well last night.  She does not have other specific physical complaints right now.  Her son tells me that she has been talking about seeing things during the day and that she seems much more worked up and agitated than he is normally used to.    Past Psychiatric History: Patient has long-standing mental health problems that appear to be fairly complicated and to go back many decades.  We do not have much in the way of good records in the computer to provide details so it is  not clear to me what her underlying diagnosis in but it certainly possible that she has bipolar disorder.  She had been on Seroquel, imipramine, Paxil and Xanax before coming into the hospital.  These had been held for about 2 days when she first came in.  Risk to Self: Is patient at risk for suicide?: No Risk to Others:   Prior Inpatient Therapy:   Prior Outpatient Therapy:    Past Medical History:  Past Medical History:  Diagnosis Date  . Anxiety   . Cancer ([place])   . Depression   . Diabetes mellitus without complication ([place])   . DVT (deep vein thrombosis) in pregnancy ([place])   . Hyperlipidemia   . Hypertension   . Insomnia   . Osteoarthritis   . Rectal bleeding   . Stroke ([place])   . Suicidal ideation     Past Surgical History:  Procedure Laterality Date  . AXILLARY LYMPH NODE DISSECTION Left 12/08/2015   Procedure: AXILLARY LYMPH NODE DISSECTION;  Surgeon: Leonie Green, MD;  Location: [facility] ORS;  Service: General;  Laterality: Left;  POSSIBLE AXILLARY NODE DISSECTION  . BREAST BIOPSY Left 10/27/2015   path pending  . COLONOSCOPY    . MASTECTOMY Left 12/08/2015  . PARTIAL MASTECTOMY WITH AXILLARY SENTINEL LYMPH NODE BIOPSY Left 12/08/2015   Procedure: LEFT MASTECTOMY WITH AXILLARY SENTINEL LYMPH NODE BIOPSY;  Surgeon: Leonie Green, MD;  Location: [facility] ORS;  Service: General;  Laterality: Left;  Mar[facility] Kitchen VEIN BYPASS SURGERY  Family History:  Family History  Problem Relation Age of Onset  . Pancreatic cancer Father 52  . Colon cancer Paternal Uncle   . Pancreatic cancer Unknown   . Heart failure Mother   . Breast cancer Mother    Family Psychiatric  History: Sounds like her mother also had mental health problems Social History:  Social History   Substance and Sexual Activity  Alcohol Use No     Social History   Substance and Sexual Activity  Drug Use No    Social History   Socioeconomic History  . Marital status: Married    Spouse name: None  . Number  of children: None  . Years of education: None  . Highest education level: None  Social Needs  . Financial resource strain: None  . Food insecurity - worry: None  . Food insecurity - inability: None  . Transportation needs - medical: None  . Transportation needs - non-medical: None  Occupational History  . None  Tobacco Use  . Smoking status: Never Smoker  . Smokeless tobacco: Never Used  Substance and Sexual Activity  . Alcohol use: No  . Drug use: No  . Sexual activity: None  Other Topics Concern  . None  Social History Narrative  . None   Additional Social History:    Allergies:   Allergies  Allergen Reactions  . Hydrocodone-Acetaminophen Other (See Comments)    Increased dream activity  . Meperidine Other (See Comments)    Rapid heart rate  . Penicillins Rash    Has patient had a PCN reaction causing immediate rash, facial/tongue/throat swelling, SOB or lightheadedness with hypotension: Yes Has patient had a PCN reaction causing severe rash involving mucus membranes or skin necrosis: No Has patient had a PCN reaction that required hospitalization: No Has patient had a PCN reaction occurring within the last 10 years: No If all of the above answers are "NO", then may proceed with Cephalosporin use.     Labs:  Results for orders placed or performed during the hospital encounter of 11/04/17 (from the past 48 hour(s))  Phosphorus     Status: Abnormal   Collection Time: 11/08/17  9:15 PM  Result Value Ref Range   Phosphorus 2.0 (L) 2.5 - 4.6 mg/dL    Comment: Performed at [facility], [address] [place], [place] [address]  Magnesium     Status: None   Collection Time: 11/08/17  9:15 PM  Result Value Ref Range   Magnesium 1.8 1.7 - 2.4 mg/dL    Comment: Performed at [facility], [address] [place], [place] [address]  Potassium     Status: None   Collection Time: 11/08/17  9:15 PM  Result Value Ref Range   Potassium 3.7 3.5 - 5.1  mmol/L    Comment: Performed at [facility], [address] [place], [place] [address]  Glucose, capillary     Status: Abnormal   Collection Time: 11/08/17 10:19 PM  Result Value Ref Range   Glucose-Capillary 145 (H) 65 - 99 mg/dL  Basic metabolic panel     Status: Abnormal   Collection Time: 11/09/17  7:20 AM  Result Value Ref Range   Sodium 136 135 - 145 mmol/L   Potassium 3.4 (L) 3.5 - 5.1 mmol/L   Chloride 97 (L) 101 - 111 mmol/L   CO2 32 22 - 32 mmol/L   Glucose, Bld 175 (H) 65 - 99 mg/dL   BUN 7 6 - 20 mg/dL  Creatinine, Ser 0.55 0.44 - 1.00 mg/dL   Calcium 8.8 (L) 8.9 - 10.3 mg/dL   GFR calc non Af Amer >60 >60 mL/min   GFR calc Af Amer >60 >60 mL/min    Comment: (NOTE) The eGFR has been calculated using the CKD EPI equation. This calculation has not been validated in all clinical situations. eGFR's persistently <60 mL/min signify possible Chronic Kidney Disease.    Anion gap 7 5 - 15    Comment: Performed at [facility], [address] [place], [place] [address]  Phosphorus     Status: Abnormal   Collection Time: 11/09/17  7:20 AM  Result Value Ref Range   Phosphorus 2.3 (L) 2.5 - 4.6 mg/dL    Comment: Performed at [facility], [address] [place], [place] [address]  Glucose, capillary     Status: Abnormal   Collection Time: 11/09/17  8:50 AM  Result Value Ref Range   Glucose-Capillary 181 (H) 65 - 99 mg/dL  Glucose, capillary     Status: Abnormal   Collection Time: 11/09/17 10:35 AM  Result Value Ref Range   Glucose-Capillary 199 (H) 65 - 99 mg/dL   Comment 1 Notify RN   Glucose, capillary     Status: Abnormal   Collection Time: 11/09/17 12:52 PM  Result Value Ref Range   Glucose-Capillary 171 (H) 65 - 99 mg/dL   Comment 1 Notify RN   Glucose, capillary     Status: Abnormal   Collection Time: 11/09/17  5:34 PM  Result Value Ref Range   Glucose-Capillary 165 (H) 65 - 99 mg/dL  Glucose, capillary     Status: Abnormal    Collection Time: 11/09/17  9:35 PM  Result Value Ref Range   Glucose-Capillary 139 (H) 65 - 99 mg/dL  Glucose, capillary     Status: Abnormal   Collection Time: 11/10/17  7:50 AM  Result Value Ref Range   Glucose-Capillary 61 (L) 65 - 99 mg/dL   Comment 1 Notify RN   Glucose, capillary     Status: None   Collection Time: 11/10/17  9:00 AM  Result Value Ref Range   Glucose-Capillary 76 65 - 99 mg/dL  Glucose, capillary     Status: Abnormal   Collection Time: 11/10/17 12:28 PM  Result Value Ref Range   Glucose-Capillary 301 (H) 65 - 99 mg/dL   Comment 1 Notify RN   Glucose, capillary     Status: Abnormal   Collection Time: 11/10/17  4:22 PM  Result Value Ref Range   Glucose-Capillary 109 (H) 65 - 99 mg/dL   Comment 1 Notify RN     Current Facility-Administered Medications  Medication Dose Route Frequency Provider Last Rate Last Dose  . 0.9 %  sodium chloride infusion  250 mL Intravenous PRN Lance Coon, MD      . acetaminophen (TYLENOL) tablet 650 mg  650 mg Oral Q4H PRN Lance Coon, MD      . ALPRAZolam Duanne Moron) tablet 0.5 mg  0.5 mg Oral Q6H PRN Latravia Southgate, Madie Reno, MD   0.5 mg at 11/09/17 2338  . amLODipine (NORVASC) tablet 5 mg  5 mg Oral Daily Loletha Grayer, MD   5 mg at 11/10/17 1058  . Chlorhexidine Gluconate Cloth 2 % PADS 6 each  6 each Topical Q0600 Nicholes Mango, MD   6 each at 11/10/17 0507  . enoxaparin (LOVENOX) injection 40 mg  40 mg Subcutaneous Q24H Lance Coon, MD   40 mg at 11/09/17 2143  .  feeding supplement (ENSURE ENLIVE) (ENSURE ENLIVE) liquid 237 mL  237 mL Oral TID BM Wilhelmina Mcardle, MD   237 mL at 11/10/17 1414  . hydrALAZINE (APRESOLINE) injection 10 mg  10 mg Intravenous Q4H PRN Wieting, Richard, MD      . imipramine (TOFRANIL) tablet 100 mg  100 mg Oral QHS Rebel Laughridge T, MD   100 mg at 11/09/17 2231  . insulin aspart (novoLOG) injection 0-15 Units  0-15 Units Subcutaneous TID WC Wilhelmina Mcardle, MD   11 Units at 11/10/17 1249  . insulin aspart  (novoLOG) injection 0-5 Units  0-5 Units Subcutaneous QHS Wilhelmina Mcardle, MD      . ipratropium-albuterol (DUONEB) 0.5-2.5 (3) MG/3ML nebulizer solution 3 mL  3 mL Nebulization Q4H PRN Wilhelmina Mcardle, MD      . lidocaine (PF) (XYLOCAINE) 1 % injection 5 mL  5 mL Other Once Conforti, Princess Karnes, DO      . MEDLINE mouth rinse  15 mL Mouth Rinse BID Wilhelmina Mcardle, MD   15 mL at 11/09/17 2151  . metoprolol tartrate (LOPRESSOR) injection 2.5-5 mg  2.5-5 mg Intravenous Q3H PRN Wilhelmina Mcardle, MD      . metoprolol tartrate (LOPRESSOR) tablet 25 mg  25 mg Oral BID Wieting, Richard, MD      . mupirocin ointment (BACTROBAN) 2 % 1 application  1 application Nasal BID Gouru, Aruna, MD   1 application at 31/51/76 1102  . nystatin (MYCOSTATIN/NYSTOP) topical powder   Topical BID Dorene Sorrow S, NP      . ondansetron (ZOFRAN) injection 4 mg  4 mg Intravenous Q6H PRN Lance Coon, MD   4 mg at 11/09/17 1222  . PARoxetine (PAXIL) tablet 40 mg  40 mg Oral Daily Cosmo Tetreault T, MD   40 mg at 11/10/17 1057  . QUEtiapine (SEROQUEL) tablet 300 mg  300 mg Oral QHS Matraca Hunkins T, MD   300 mg at 11/09/17 2143  . sodium chloride 0.9 % bolus 1,000 mL  1,000 mL Intravenous PRN Lance Coon, MD      . valACYclovir Estell Harpin) tablet 1,000 mg  1,000 mg Oral TID Loletha Grayer, MD   1,000 mg at 11/10/17 1613    Musculoskeletal: Strength & Muscle Tone: within normal limits Gait & Station: unsteady Patient leans: N/A  Psychiatric Specialty Exam: Physical Exam  Nursing note and vitals reviewed. Constitutional: She appears well-developed and well-nourished.  HENT:  Head: Normocephalic and atraumatic.  Eyes: Conjunctivae are normal. Pupils are equal, round, and reactive to light.  Neck: Normal range of motion.  Cardiovascular: Regular rhythm and normal heart sounds.  Respiratory: Effort normal. No respiratory distress.  GI: Soft.  Musculoskeletal: Normal range of motion.  Neurological: She is alert.  Skin:  Skin is warm and dry.  Psychiatric: Her mood appears anxious. Her affect is labile. Her speech is tangential. She is agitated. She is not aggressive. Cognition and memory are impaired. She expresses impulsivity. She expresses no homicidal and no suicidal ideation.    Review of Systems  Constitutional: Negative.   HENT: Negative.   Eyes: Negative.   Respiratory: Negative.   Cardiovascular: Negative.   Gastrointestinal: Negative.   Musculoskeletal: Negative.   Skin: Negative.   Neurological: Negative.   Psychiatric/Behavioral: Positive for hallucinations. Negative for depression, memory loss, substance abuse and suicidal ideas. The patient is nervous/anxious and has insomnia.     Blood pressure 129/73, pulse 100, temperature 99.1 F (37.3 C), temperature source Oral, resp. rate  18, height 5' 7"  (1.702 m), weight 76.7 kg (169 lb), SpO2 93 %.Body mass index is 26.47 kg/m.  General Appearance: Casual  Eye Contact:  Good  Speech:  Pressured  Volume:  Increased  Mood:  Anxious and Irritable  Affect:  Labile  Thought Process:  Disorganized  Orientation:  Full (Time, Place, and Person)  Thought Content:  Illogical and Rumination  Suicidal Thoughts:  No  Homicidal Thoughts:  No  Memory:  Immediate;   Fair Recent;   Fair Remote;   Fair  Judgement:  Fair  Insight:  Fair  Psychomotor Activity:  Normal  Concentration:  Concentration: Fair  Recall:  [employer] of Knowledge:  Fair  Language:  Fair  Akathisia:  No  Handed:  Right  AIMS (if indicated):     Assets:  Communication Skills Desire for Improvement Housing Resilience Social Support  ADL's:  Intact  Cognition:  Impaired,  Mild  Sleep:        Treatment Plan Summary: Medication management and Plan Patient seen for follow-up today after I restarted her medicines yesterday.  I had seen the notes from earlier today and been concerned that perhaps she was oversedated.  On the contrary she seems a little worked up and almost  seems manic today.  As I told her son, at least she is not as confused and encephalopathic as when she first came into the hospital so we are hoping that she is going in the right direction.  I will not change anything about her medicine for now.  You would think that this medication she has would be enough to help her get a good night's sleep tonight.  I will ask the doctor on call over the weekend to check up but if needed please call for another psychiatric evaluation in case this is starting to become more of a psychiatric problem.  Disposition: No evidence of imminent risk to self or others at present.   Supportive therapy provided about ongoing stressors.  Alethia Berthold, MD 11/10/2017 7:21 PM

## 2017-11-10 NOTE — Clinical Social Work Placement (Signed)
   CLINICAL SOCIAL WORK PLACEMENT  NOTE  Date:  11/10/2017  Patient Details  Name: NEGIN HEGG MRN: [id number] Date of Birth: 01/04/1946  Clinical Social Work is seeking post-discharge placement for this patient at the [place] level of care (*CSW will initial, date and re-position this form in  chart as items are completed):  Yes   Patient/family provided with [place] Work Department's list of facilities offering this level of care within the geographic area requested by the patient (or if unable, by the patient's family).  Yes   Patient/family informed of their freedom to choose among providers that offer the needed level of care, that participate in Medicare, Medicaid or managed care program needed by the patient, have an available bed and are willing to accept the patient.  Yes   Patient/family informed of [facility]'s ownership interest in [facility] and [facility], as well as of the fact that they are under no obligation to receive care at these facilities.  PASRR submitted to EDS on 11/09/17     PASRR number received on 11/09/17     Existing PASRR number confirmed on       FL2 transmitted to all facilities in geographic area requested by pt/family on 11/09/17     FL2 transmitted to all facilities within larger geographic area on       Patient informed that his/her managed care company has contracts with or will negotiate with certain facilities, including the following:        Yes   Patient/family informed of bed offers received.  Patient chooses bed at       Physician recommends and patient chooses bed at      Patient to be transferred to   on  .  Patient to be transferred to facility by       Patient family notified on   of transfer.  Name of family member notified:        PHYSICIAN       Additional Comment:    _______________________________________________ Sue Mcalexander, Veronia Beets, LCSW 11/10/2017, 2:35 PM

## 2017-11-10 NOTE — Progress Notes (Signed)
Patient ID: Yvette Tyler, female   DOB: 04-30-1946, 72 y.o.   MRN: [id number]  [facility] PROGRESS NOTE  Yvette Tyler ATF:[id number] DOB: 1946-10-16 DOA: 11/04/2017 PCP: Glendon Axe, MD  HPI/Subjective: Patient with lots of complaints today.  Seems more confused today than yesterday.  She was restarted on her psychiatric medications yesterday.  Patient states she is doing better but difficult to focus in on specific questions.  Objective: Vitals:   11/10/17 0713 11/10/17 1056  BP: (!) 160/68 (!) 167/91  Pulse: 91 (!) 101  Resp: 16   Temp: 98.8 F (37.1 C)   SpO2: 95%     Filed Weights   11/08/17 0600 11/09/17 0500 11/10/17 0253  Weight: 87.4 kg (192 lb 10.9 oz) 54.4 kg (120 lb) 76.7 kg (169 lb)    ROS: Review of Systems  Unable to perform ROS: Psychiatric disorder  Respiratory: Negative for shortness of breath.   Cardiovascular: Negative for chest pain.  Gastrointestinal: Negative for abdominal pain.   Exam: Physical Exam  HENT:  Nose: No mucosal edema.  Mouth/Throat: No oropharyngeal exudate or posterior oropharyngeal edema.  Eyes: Conjunctivae, EOM and lids are normal. Pupils are equal, round, and reactive to light.  Neck: No JVD present. Carotid bruit is not present. No edema present. No thyroid mass and no thyromegaly present.  Cardiovascular: S1 normal and S2 normal. Exam reveals no gallop.  No murmur heard. Pulses:      Dorsalis pedis pulses are 2+ on the right side, and 2+ on the left side.  Respiratory: No respiratory distress. She has no wheezes. She has no rhonchi. She has no rales.  GI: Soft. Bowel sounds are normal. There is no tenderness.  Musculoskeletal:       Right ankle: She exhibits no swelling.       Left ankle: She exhibits no swelling.  Lymphadenopathy:    She has no cervical adenopathy.  Neurological: She is alert. No cranial nerve deficit.  Skin: Skin is warm. No rash noted. Nails show no clubbing.  Psychiatric: She has a  normal mood and affect.      Data Reviewed: Basic Metabolic Panel: Recent Labs  Lab 11/05/17 0254 11/06/17 0431 11/07/17 0415 11/08/17 0504 11/08/17 2115 11/09/17 0720  NA 139 139 147* 142  --  136  K 4.0 2.7* 3.9 3.4* 3.7 3.4*  CL 112* 104 115* 104  --  97*  CO2 18* 23 27 31   --  32  GLUCOSE 452* 418* 169* 90  --  175*  BUN 14 19 15 10   --  7  CREATININE 0.90 0.74 0.68 0.60  --  0.55  CALCIUM 7.9* 7.7* 8.1* 8.5*  --  8.8*  MG 2.0 1.8  --  1.7 1.8  --   PHOS 3.6 2.8  --  1.6* 2.0* 2.3*   Liver Function Tests: Recent Labs  Lab 11/04/17 1700 11/05/17 0254 11/07/17 0415  AST 38 27 22  ALT 27 23 22   ALKPHOS 113 84 56  BILITOT 1.1 1.2 0.2*  PROT 7.7 6.0* 4.8*  ALBUMIN 3.8 3.1* 2.4*    Recent Labs  Lab 11/04/17 1701 11/05/17 0254  AMMONIA 14 17   CBC: Recent Labs  Lab 11/04/17 1700 11/05/17 0254 11/06/17 0600 11/07/17 0415 11/08/17 0504  WBC 16.5* 11.6* 13.2* 12.7* 12.4*  NEUTROABS 14.4*  --   --   --   --   HGB 15.6 13.4 12.2 11.5* 13.1  HCT 46.9 40.3 36.3 33.6* 38.9  MCV  89.9 90.9 89.8 89.6 90.4  PLT 235 203 170 148* 153   Cardiac Enzymes: Recent Labs  Lab 11/04/17 1700 11/05/17 0254 11/05/17 0900 11/05/17 1412  TROPONINI <0.03 0.03* <0.03 <0.03    CBG: Recent Labs  Lab 11/09/17 1734 11/09/17 2135 11/10/17 0750 11/10/17 0900 11/10/17 1228  GLUCAP 165* 139* 61* 76 301*    Recent Results (from the past 240 hour(s))  Urine Culture     Status: None   Collection Time: 11/04/17  5:02 PM  Result Value Ref Range Status   Specimen Description   Final    URINE, RANDOM Performed at [facility], [address] [place], [place] [address]    Special Requests   Final    NONE Performed at [facility], [address] [place], [place] [address]    Culture   Final    NO GROWTH Performed at [facility], [place] [address] [place], [place] [address]    Report Status 11/06/2017 FINAL  Final  Respiratory Panel by PCR      Status: None   Collection Time: 11/04/17  5:54 PM  Result Value Ref Range Status   Adenovirus NOT DETECTED NOT DETECTED Final   Coronavirus 229E NOT DETECTED NOT DETECTED Final   Coronavirus HKU1 NOT DETECTED NOT DETECTED Final   Coronavirus NL63 NOT DETECTED NOT DETECTED Final   Coronavirus OC43 NOT DETECTED NOT DETECTED Final   Metapneumovirus NOT DETECTED NOT DETECTED Final   Rhinovirus / Enterovirus NOT DETECTED NOT DETECTED Final   Influenza A NOT DETECTED NOT DETECTED Final   Influenza B NOT DETECTED NOT DETECTED Final   Parainfluenza Virus 1 NOT DETECTED NOT DETECTED Final   Parainfluenza Virus 2 NOT DETECTED NOT DETECTED Final   Parainfluenza Virus 3 NOT DETECTED NOT DETECTED Final   Parainfluenza Virus 4 NOT DETECTED NOT DETECTED Final   Respiratory Syncytial Virus NOT DETECTED NOT DETECTED Final   Bordetella pertussis NOT DETECTED NOT DETECTED Final   Chlamydophila pneumoniae NOT DETECTED NOT DETECTED Final   Mycoplasma pneumoniae NOT DETECTED NOT DETECTED Final  MRSA PCR Screening     Status: Abnormal   Collection Time: 11/05/17  2:25 AM  Result Value Ref Range Status   MRSA by PCR POSITIVE (A) NEGATIVE Final    Comment:        The GeneXpert MRSA Assay (FDA approved for NASAL specimens only), is one component of a comprehensive MRSA colonization surveillance program. It is not intended to diagnose MRSA infection nor to guide or monitor treatment for MRSA infections. RESULT CALLED TO, READ BACK BY AND VERIFIED WITH: ALECIA RAWLINS AT 8469 ON 11/05/17 RWW Performed at [facility], [address] [place], [place] [address]   Culture, blood (Routine X 2) w Reflex to ID Panel     Status: None   Collection Time: 11/05/17  6:27 AM  Result Value Ref Range Status   Specimen Description BLOOD RIGHT WRIST  Final   Special Requests   Final    BOTTLES DRAWN AEROBIC AND ANAEROBIC Blood Culture adequate volume   Culture   Final    NO GROWTH 5 DAYS Performed at  [facility], [address], [place],  [address]    Report Status 11/10/2017 FINAL  Final  Culture, blood (Routine X 2) w Reflex to ID Panel     Status: None   Collection Time: 11/05/17  6:27 AM  Result Value Ref Range Status   Specimen Description BLOOD RIGHT HAND  Final   Special Requests   Final    BOTTLES DRAWN AEROBIC AND ANAEROBIC Blood Culture adequate volume   Culture   Final    NO GROWTH 5 DAYS Performed at [facility], [address] [place], [place] [address]    Report Status 11/10/2017 FINAL  Final  Culture, respiratory (NON-Expectorated)     Status: None   Collection Time: 11/05/17  3:26 PM  Result Value Ref Range Status   Specimen Description   Final    TRACHEAL ASPIRATE Performed at [facility], [address] [place], [place] [address]    Special Requests   Final    NONE Performed at [facility], [address] [place], [place] [address]    Gram Stain   Final    FEW WBC PRESENT, PREDOMINANTLY MONONUCLEAR NO ORGANISMS SEEN    Culture   Final    RARE Consistent with normal respiratory flora. Performed at [facility], [place] [address] [place], [place] [address]    Report Status 11/08/2017 FINAL  Final  CSF culture     Status: None (Preliminary result)   Collection Time: 11/06/17 12:35 PM  Result Value Ref Range Status   Specimen Description   Final    CSF Performed at [facility], [address] [place], [place] [address]    Special Requests   Final    NONE Performed at [facility], [place], [place] [address]    Gram Stain NO ORGANISMS SEEN CYTOSPIN SMEAR   Final   Culture   Final    NO GROWTH 3 DAYS Performed at [facility], [place] [address] [place], [place] [address]    Report Status PENDING  Incomplete  Virus culture     Status: None   Collection Time: 11/06/17 12:35 PM  Result Value Ref Range Status   Viral Culture Comment  Final     Comment: (NOTE) Preliminary Report: No virus isolated at 4 days.  Next report to follow after 7 days. Performed At: [facility] [place], [place] [id number] Rush Farmer MD RJ:[id number]    Source of Sample CSF  Final    Comment: Performed at [facility], [address] [place], [place] [address]      Scheduled Meds: . amLODipine  5 mg Oral Daily  . Chlorhexidine Gluconate Cloth  6 each Topical Q0600  . enoxaparin (LOVENOX) injection  40 mg Subcutaneous Q24H  . feeding supplement (ENSURE ENLIVE)  237 mL Oral TID BM  . imipramine  100 mg Oral QHS  . insulin aspart  0-15 Units Subcutaneous TID WC  . insulin aspart  0-5 Units Subcutaneous QHS  . lidocaine (PF)  5 mL Other Once  . mouth rinse  15 mL Mouth Rinse BID  . metoprolol tartrate  25 mg Oral BID  . mupirocin ointment  1 application Nasal BID  . nystatin   Topical BID  . PARoxetine  40 mg Oral Daily  . QUEtiapine  300 mg Oral QHS  . valACYclovir  1,000 mg Oral TID   Continuous Infusions: . sodium chloride    . sodium chloride      Assessment/Plan:  1. Possible encephalitis.  Seems to be clinically improving.  Antibiotics discontinued.  Patient's acyclovir was switched to Valtrex orally.  Sepsis ruled out with cultures being negative. 2. Acute encephalopathy.  Patient seems a little more confused today.  Psychiatry restarted her psychiatric medications last night.  May need another day or so with follow-up with psychiatry. 3. Accelerated hypertension tachycardia.  Start low-dose metoprolol.  Norvasc added. 4. Acute kidney injury improved with IV fluid hydration 5. Hypokalemia.  Replace orally 6. Type 2 diabetes mellitus with relative hypoglycemia this morning.  Restart lower dose Lantus this evening. 7. Seizure.  As per neurology Keppra will be discontinued.  8. Physical therapy recommended rehab.  Patient does not want to go to rehab.  Case discussed with son at the bedside.  Code  Status:     Code Status Orders  (From admission, onward)        Start     Ordered   11/05/17 0220  Full code  Continuous     11/05/17 0220    Code Status History    Date Active Date Inactive Code Status Order ID Comments User Context   12/08/2015 17:50 12/09/2015 19:51 Full Code [id number]  Leonie Green, MD Inpatient     Family Communication: spoke with son at the bedside Disposition Plan: Physical therapy recommended rehab but the patient does not want to go.  Consultants:  Neurology  Antibiotics:  acyclovir  Time spent: 27 minutes  [place]

## 2017-11-11 LAB — GLUCOSE, CAPILLARY
GLUCOSE-CAPILLARY: 160 mg/dL — AB (ref 65–99)
Glucose-Capillary: 237 mg/dL — ABNORMAL HIGH (ref 65–99)
Glucose-Capillary: 74 mg/dL (ref 65–99)
Glucose-Capillary: 288 mg/dL — ABNORMAL HIGH (ref 65–99)

## 2017-11-11 LAB — BASIC METABOLIC PANEL
ANION GAP: 10 (ref 5–15)
BUN: 17 mg/dL (ref 6–20)
CALCIUM: 9 mg/dL (ref 8.9–10.3)
CHLORIDE: 96 mmol/L — AB (ref 101–111)
CO2: 29 mmol/L (ref 22–32)
Creatinine, Ser: 1.03 mg/dL — ABNORMAL HIGH (ref 0.44–1.00)
GFR calc non Af Amer: 53 mL/min — ABNORMAL LOW (ref >60)
Glucose, Bld: 206 mg/dL — ABNORMAL HIGH (ref 65–99)
Potassium: 3.2 mmol/L — ABNORMAL LOW (ref 3.5–5.1)
Sodium: 135 mmol/L (ref 135–145)

## 2017-11-11 LAB — HEMOGLOBIN A1C
HEMOGLOBIN A1C: 12.6 % — AB (ref 4.8–5.6)
Mean Plasma Glucose: 314.92 mg/dL

## 2017-11-11 LAB — CSF CULTURE W GRAM STAIN: Culture: NO GROWTH

## 2017-11-11 LAB — MAGNESIUM: Magnesium: 1.8 mg/dL (ref 1.7–2.4)

## 2017-11-11 LAB — CSF CULTURE

## 2017-11-11 MED ORDER — POTASSIUM CHLORIDE 20 MEQ PO PACK
40.0000 meq | PACK | Freq: Once | ORAL | Status: AC
  Administered 2017-11-11: 40 meq via ORAL
  Filled 2017-11-11: qty 2

## 2017-11-11 MED ORDER — MAGNESIUM SULFATE 2 GM/50ML IV SOLN
2.0000 g | Freq: Once | INTRAVENOUS | Status: AC
  Administered 2017-11-11: 2 g via INTRAVENOUS
  Filled 2017-11-11: qty 50

## 2017-11-11 MED ORDER — INSULIN GLARGINE 100 UNIT/ML ~~LOC~~ SOLN
8.0000 [IU] | Freq: Every day | SUBCUTANEOUS | Status: DC
  Administered 2017-11-11 – 2017-11-12 (×2): 8 [IU] via SUBCUTANEOUS
  Filled 2017-11-11 (×2): qty 0.08

## 2017-11-11 NOTE — Progress Notes (Signed)
Potassium 3.2 this morning. Md notified. New order obtained.

## 2017-11-11 NOTE — Progress Notes (Signed)
Patient ID: Yvette Tyler, female   DOB: 1946/03/19, 72 y.o.   MRN: [id number]  [facility] PROGRESS NOTE  DEBRIA BROECKER FIE:[id number] DOB: 12-14-1945 DOA: 11/04/2017 PCP: Glendon Axe, MD  HPI/Subjective: Patient still hard to focus on things but a little bit better than yesterday.  States she did sleep last night..  Objective: Vitals:   11/11/17 0453 11/11/17 0830  BP: (!) 143/67 (!) 152/78  Pulse: 87 90  Resp: 16 18  Temp: 98.5 F (36.9 C) 98.7 F (37.1 C)  SpO2: 95% 94%    Filed Weights   11/09/17 0500 11/10/17 0253 11/11/17 0100  Weight: 54.4 kg (120 lb) 76.7 kg (169 lb) 78.5 kg (173 lb)    ROS: Review of Systems  Unable to perform ROS: Psychiatric disorder  Respiratory: Negative for shortness of breath.   Cardiovascular: Negative for chest pain.  Gastrointestinal: Negative for abdominal pain.   Exam: Physical Exam  HENT:  Nose: No mucosal edema.  Mouth/Throat: No oropharyngeal exudate or posterior oropharyngeal edema.  Eyes: Conjunctivae, EOM and lids are normal. Pupils are equal, round, and reactive to light.  Neck: No JVD present. Carotid bruit is not present. No edema present. No thyroid mass and no thyromegaly present.  Cardiovascular: S1 normal and S2 normal. Exam reveals no gallop.  No murmur heard. Pulses:      Dorsalis pedis pulses are 2+ on the right side, and 2+ on the left side.  Respiratory: No respiratory distress. She has no wheezes. She has no rhonchi. She has no rales.  GI: Soft. Bowel sounds are normal. There is no tenderness.  Musculoskeletal:       Right ankle: She exhibits no swelling.       Left ankle: She exhibits no swelling.  Lymphadenopathy:    She has no cervical adenopathy.  Neurological: She is alert. No cranial nerve deficit.  Skin: Skin is warm. No rash noted. Nails show no clubbing.  Some bruising on her right arm at IV sites.  Some bruising on the legs.  Psychiatric: She has a normal mood and affect.       Data Reviewed: Basic Metabolic Panel: Recent Labs  Lab 11/05/17 0254 11/06/17 0431 11/07/17 0415 11/08/17 0504 11/08/17 2115 11/09/17 0720 11/11/17 0322  NA 139 139 147* 142  --  136 135  K 4.0 2.7* 3.9 3.4* 3.7 3.4* 3.2*  CL 112* 104 115* 104  --  97* 96*  CO2 18* 23 27 31   --  32 29  GLUCOSE 452* 418* 169* 90  --  175* 206*  BUN 14 19 15 10   --  7 17  CREATININE 0.90 0.74 0.68 0.60  --  0.55 1.03*  CALCIUM 7.9* 7.7* 8.1* 8.5*  --  8.8* 9.0  MG 2.0 1.8  --  1.7 1.8  --  1.8  PHOS 3.6 2.8  --  1.6* 2.0* 2.3*  --    Liver Function Tests: Recent Labs  Lab 11/04/17 1700 11/05/17 0254 11/07/17 0415  AST 38 27 22  ALT 27 23 22   ALKPHOS 113 84 56  BILITOT 1.1 1.2 0.2*  PROT 7.7 6.0* 4.8*  ALBUMIN 3.8 3.1* 2.4*    Recent Labs  Lab 11/04/17 1701 11/05/17 0254  AMMONIA 14 17   CBC: Recent Labs  Lab 11/04/17 1700 11/05/17 0254 11/06/17 0600 11/07/17 0415 11/08/17 0504  WBC 16.5* 11.6* 13.2* 12.7* 12.4*  NEUTROABS 14.4*  --   --   --   --   HGB  15.6 13.4 12.2 11.5* 13.1  HCT 46.9 40.3 36.3 33.6* 38.9  MCV 89.9 90.9 89.8 89.6 90.4  PLT 235 203 170 148* 153   Cardiac Enzymes: Recent Labs  Lab 11/04/17 1700 11/05/17 0254 11/05/17 0900 11/05/17 1412  TROPONINI <0.03 0.03* <0.03 <0.03    CBG: Recent Labs  Lab 11/10/17 1228 11/10/17 1622 11/10/17 2149 11/11/17 0757 11/11/17 1201  GLUCAP 301* 109* 274* 160* 237*    Recent Results (from the past 240 hour(s))  Urine Culture     Status: None   Collection Time: 11/04/17  5:02 PM  Result Value Ref Range Status   Specimen Description   Final    URINE, RANDOM Performed at [facility], [address], [place], [place] [address]    Special Requests   Final    NONE Performed at [facility], [address] [place], [place] [address]    Culture   Final    NO GROWTH Performed at [facility], [place] [address] [place], [place] [address]    Report Status 11/06/2017  FINAL  Final  Respiratory Panel by PCR     Status: None   Collection Time: 11/04/17  5:54 PM  Result Value Ref Range Status   Adenovirus NOT DETECTED NOT DETECTED Final   Coronavirus 229E NOT DETECTED NOT DETECTED Final   Coronavirus HKU1 NOT DETECTED NOT DETECTED Final   Coronavirus NL63 NOT DETECTED NOT DETECTED Final   Coronavirus OC43 NOT DETECTED NOT DETECTED Final   Metapneumovirus NOT DETECTED NOT DETECTED Final   Rhinovirus / Enterovirus NOT DETECTED NOT DETECTED Final   Influenza A NOT DETECTED NOT DETECTED Final   Influenza B NOT DETECTED NOT DETECTED Final   Parainfluenza Virus 1 NOT DETECTED NOT DETECTED Final   Parainfluenza Virus 2 NOT DETECTED NOT DETECTED Final   Parainfluenza Virus 3 NOT DETECTED NOT DETECTED Final   Parainfluenza Virus 4 NOT DETECTED NOT DETECTED Final   Respiratory Syncytial Virus NOT DETECTED NOT DETECTED Final   Bordetella pertussis NOT DETECTED NOT DETECTED Final   Chlamydophila pneumoniae NOT DETECTED NOT DETECTED Final   Mycoplasma pneumoniae NOT DETECTED NOT DETECTED Final  MRSA PCR Screening     Status: Abnormal   Collection Time: 11/05/17  2:25 AM  Result Value Ref Range Status   MRSA by PCR POSITIVE (A) NEGATIVE Final    Comment:        The GeneXpert MRSA Assay (FDA approved for NASAL specimens only), is one component of a comprehensive MRSA colonization surveillance program. It is not intended to diagnose MRSA infection nor to guide or monitor treatment for MRSA infections. RESULT CALLED TO, READ BACK BY AND VERIFIED WITH: ALECIA RAWLINS AT 0258 ON 11/05/17 RWW Performed at [facility], [address] [place], [place] [address]   Culture, blood (Routine X 2) w Reflex to ID Panel     Status: None   Collection Time: 11/05/17  6:27 AM  Result Value Ref Range Status   Specimen Description BLOOD RIGHT WRIST  Final   Special Requests   Final    BOTTLES DRAWN AEROBIC AND ANAEROBIC Blood Culture adequate volume   Culture    Final    NO GROWTH 5 DAYS Performed at [facility], [address] [place],  [address]    Report Status 11/10/2017 FINAL  Final  Culture, blood (Routine X 2) w Reflex to ID Panel     Status: None   Collection Time: 11/05/17  6:27 AM  Result Value Ref Range Status   Specimen Description BLOOD RIGHT HAND  Final   Special Requests   Final    BOTTLES DRAWN AEROBIC AND ANAEROBIC Blood Culture adequate volume   Culture   Final    NO GROWTH 5 DAYS Performed at [facility], [address] [place], [place] [address]    Report Status 11/10/2017 FINAL  Final  Culture, respiratory (NON-Expectorated)     Status: None   Collection Time: 11/05/17  3:26 PM  Result Value Ref Range Status   Specimen Description   Final    TRACHEAL ASPIRATE Performed at [facility], [address], [place], [place] [address]    Special Requests   Final    NONE Performed at [facility], [address] [place], [place] [address]    Gram Stain   Final    FEW WBC PRESENT, PREDOMINANTLY MONONUCLEAR NO ORGANISMS SEEN    Culture   Final    RARE Consistent with normal respiratory flora. Performed at [facility], [place] [address] [place], [place] [address]    Report Status 11/08/2017 FINAL  Final  CSF culture     Status: None   Collection Time: 11/06/17 12:35 PM  Result Value Ref Range Status   Specimen Description   Final    CSF Performed at [facility], [address], [place], [place] [address]    Special Requests   Final    NONE Performed at [facility], [address] [place], [place] [address]    Gram Stain   Final    WBC PRESENT, PREDOMINANTLY MONONUCLEAR NO ORGANISMS SEEN CYTOSPIN SMEAR    Culture   Final    NO GROWTH 3 DAYS Performed at [facility], [place] [address] [place], [place] [address]    Report Status 11/11/2017 FINAL  Final  Virus culture     Status: None   Collection Time: 11/06/17 12:35 PM   Result Value Ref Range Status   Viral Culture Comment  Final    Comment: (NOTE) Preliminary Report: No virus isolated at 4 days.  Next report to follow after 7 days. Performed At: [facility] [place], [place] [id number] Rush Farmer MD TK:[id number]    Source of Sample CSF  Final    Comment: Performed at [facility], [address] [place], [place] [address]      Scheduled Meds: . amLODipine  5 mg Oral Daily  . enoxaparin (LOVENOX) injection  40 mg Subcutaneous Q24H  . feeding supplement (ENSURE ENLIVE)  237 mL Oral TID BM  . imipramine  100 mg Oral QHS  . insulin aspart  0-15 Units Subcutaneous TID WC  . insulin aspart  0-5 Units Subcutaneous QHS  . insulin glargine  8 Units Subcutaneous Daily  . lidocaine (PF)  5 mL Other Once  . mouth rinse  15 mL Mouth Rinse BID  . metoprolol tartrate  25 mg Oral BID  . nystatin   Topical BID  . PARoxetine  40 mg Oral Daily  . QUEtiapine  300 mg Oral QHS  . valACYclovir  1,000 mg Oral TID   Continuous Infusions: . sodium chloride    . sodium chloride      Assessment/Plan:  1. Possible encephalitis.  Seems to be clinically improving.  Antibiotics discontinued.  Patient's acyclovir was switched to Valtrex orally.  Sepsis ruled out with cultures being negative. 2. Acute encephalopathy.  Patient seems a little bit  better today.  Still hard for the patient to focus on my questions.[place]  Psychiatry restarted her psychiatric medications last night.  May need another day or so with follow-up with psychiatry. 3. Accelerated hypertension and tachycardia.   continue low-dose metoprolol and Norvasc 4. Acute kidney injury improved with IV fluid hydration 5. Hypokalemia and hypomagnesemia.  Replace potassium orally and magnesium IV 6. Type 2 diabetes mellitus.  Low-dose Lantus started.  Added on hemoglobin A1c 7. Seizure.  As per neurology Keppra is discontinued 8. Physical therapy recommended rehab.  Patient  does not want to go to rehab.  Case discussed with son at the bedside yesterday.  Code Status:     Code Status Orders  (From admission, onward)        Start     Ordered   11/05/17 0220  Full code  Continuous     11/05/17 0220    Code Status History    Date Active Date Inactive Code Status Order ID Comments User Context   12/08/2015 17:50 12/09/2015 19:51 Full Code [id number]  Leonie Green, MD Inpatient     Family Communication: spoke with son yesterday Disposition Plan: Physical therapy recommended rehab but the patient and family would like her to go.  Consultants:  Neurology  Antibiotics:  acyclovir  Time spent: 25 minutes  [place]

## 2017-11-12 LAB — GLUCOSE, CAPILLARY
GLUCOSE-CAPILLARY: 268 mg/dL — AB (ref 65–99)
GLUCOSE-CAPILLARY: 96 mg/dL (ref 65–99)
GLUCOSE-CAPILLARY: 194 mg/dL — AB (ref 65–99)
Glucose-Capillary: 189 mg/dL — ABNORMAL HIGH (ref 65–99)

## 2017-11-12 MED ORDER — INSULIN GLARGINE 100 UNIT/ML ~~LOC~~ SOLN
12.0000 [IU] | Freq: Every day | SUBCUTANEOUS | Status: DC
  Administered 2017-11-13: 12 [IU] via SUBCUTANEOUS
  Filled 2017-11-12 (×2): qty 0.12

## 2017-11-12 NOTE — Progress Notes (Signed)
Patient alert to self and place. Frequent calls to the room throughout the day . Patient was able to stand up with a walker and used bedside commode x 2 during the day. VS remain stable. Husband at bedside.

## 2017-11-12 NOTE — Progress Notes (Signed)
Patient has been confused all night and arguing with husband.  Patient was settled down after husband left.

## 2017-11-12 NOTE — Clinical Social Work Note (Signed)
CSW spoke with the patient's son to discuss bed offers for SNF. Yvette Tyler reported that his mother would like to go to [facility]. Yvette Tyler also indicated that he will be out of town on business for the next few days but will be reachable by cell phone. CSW has selected the SNF in the [place] and is following for discharge facilitation.  Santiago Bumpers, MSW, Latanya Presser [phone]

## 2017-11-12 NOTE — Progress Notes (Signed)
Patient ID: Yvette Tyler, female   DOB: [birth year], 72 y.o.   MRN: [id number]  [facility] PROGRESS NOTE  JOREEN SWEARINGIN EGB:[id number] DOB: 09-13-1946 DOA: 11/04/2017 PCP: Glendon Axe, MD  HPI/Subjective: Patient still hard to focus on things but a little bit better than yesterday.  States she did sleep last night..  Objective: Vitals:   11/12/17 3710 11/12/17 0826  BP: 140/80 (!) 142/74  Pulse: 96 91  Resp:  20  Temp: 98.6 F (37 C) 98.2 F (36.8 C)  SpO2: 92% 93%    Filed Weights   11/10/17 0253 11/11/17 0100 11/12/17 0500  Weight: 76.7 kg (169 lb) 78.5 kg (173 lb) 78.4 kg (172 lb 13.5 oz)    ROS: Review of Systems  Unable to perform ROS: Psychiatric disorder  Respiratory: Negative for shortness of breath.   Cardiovascular: Negative for chest pain.  Gastrointestinal: Negative for abdominal pain.   Exam: Physical Exam  HENT:  Nose: No mucosal edema.  Mouth/Throat: No oropharyngeal exudate or posterior oropharyngeal edema.  Eyes: Conjunctivae, EOM and lids are normal. Pupils are equal, round, and reactive to light.  Neck: No JVD present. Carotid bruit is not present. No edema present. No thyroid mass and no thyromegaly present.  Cardiovascular: S1 normal and S2 normal. Exam reveals no gallop.  No murmur heard. Pulses:      Dorsalis pedis pulses are 2+ on the right side, and 2+ on the left side.  Respiratory: No respiratory distress. She has no wheezes. She has no rhonchi. She has no rales.  GI: Soft. Bowel sounds are normal. There is no tenderness.  Musculoskeletal:       Right ankle: She exhibits no swelling.       Left ankle: She exhibits no swelling.  Lymphadenopathy:    She has no cervical adenopathy.  Neurological: She is alert. No cranial nerve deficit.  Skin: Skin is warm. No rash noted. Nails show no clubbing.  Some bruising on her right arm at IV sites.  Some bruising on the legs.  Psychiatric: She has a normal mood and affect.       Data Reviewed: Basic Metabolic Panel: Recent Labs  Lab 11/06/17 0431 11/07/17 0415 11/08/17 0504 11/08/17 2115 11/09/17 0720 11/11/17 0322  NA 139 147* 142  --  136 135  K 2.7* 3.9 3.4* 3.7 3.4* 3.2*  CL 104 115* 104  --  97* 96*  CO2 23 27 31   --  32 29  GLUCOSE 418* 169* 90  --  175* 206*  BUN 19 15 10   --  7 17  CREATININE 0.74 0.68 0.60  --  0.55 1.03*  CALCIUM 7.7* 8.1* 8.5*  --  8.8* 9.0  MG 1.8  --  1.7 1.8  --  1.8  PHOS 2.8  --  1.6* 2.0* 2.3*  --    Liver Function Tests: Recent Labs  Lab 11/07/17 0415  AST 22  ALT 22  ALKPHOS 56  BILITOT 0.2*  PROT 4.8*  ALBUMIN 2.4*    No results for input(s): AMMONIA in the last 168 hours. CBC: Recent Labs  Lab 11/06/17 0600 11/07/17 0415 11/08/17 0504  WBC 13.2* 12.7* 12.4*  HGB 12.2 11.5* 13.1  HCT 36.3 33.6* 38.9  MCV 89.8 89.6 90.4  PLT 170 148* 153   Cardiac Enzymes: Recent Labs  Lab 11/05/17 1412  TROPONINI <0.03    CBG: Recent Labs  Lab 11/11/17 1201 11/11/17 1650 11/11/17 2145 11/12/17 0741 11/12/17 1210  GLUCAP 237*  74 288* 189* 268*    Recent Results (from the past 240 hour(s))  Urine Culture     Status: None   Collection Time: 11/04/17  5:02 PM  Result Value Ref Range Status   Specimen Description   Final    URINE, RANDOM Performed at [facility], [address], [place], [place] [address]    Special Requests   Final    NONE Performed at [facility], [address] [place], [place] [address]    Culture   Final    NO GROWTH Performed at [facility], [place] [address], [place], [place] [address]    Report Status 11/06/2017 FINAL  Final  Respiratory Panel by PCR     Status: None   Collection Time: 11/04/17  5:54 PM  Result Value Ref Range Status   Adenovirus NOT DETECTED NOT DETECTED Final   Coronavirus 229E NOT DETECTED NOT DETECTED Final   Coronavirus HKU1 NOT DETECTED NOT DETECTED Final   Coronavirus NL63 NOT DETECTED NOT DETECTED Final    Coronavirus OC43 NOT DETECTED NOT DETECTED Final   Metapneumovirus NOT DETECTED NOT DETECTED Final   Rhinovirus / Enterovirus NOT DETECTED NOT DETECTED Final   Influenza A NOT DETECTED NOT DETECTED Final   Influenza B NOT DETECTED NOT DETECTED Final   Parainfluenza Virus 1 NOT DETECTED NOT DETECTED Final   Parainfluenza Virus 2 NOT DETECTED NOT DETECTED Final   Parainfluenza Virus 3 NOT DETECTED NOT DETECTED Final   Parainfluenza Virus 4 NOT DETECTED NOT DETECTED Final   Respiratory Syncytial Virus NOT DETECTED NOT DETECTED Final   Bordetella pertussis NOT DETECTED NOT DETECTED Final   Chlamydophila pneumoniae NOT DETECTED NOT DETECTED Final   Mycoplasma pneumoniae NOT DETECTED NOT DETECTED Final  MRSA PCR Screening     Status: Abnormal   Collection Time: 11/05/17  2:25 AM  Result Value Ref Range Status   MRSA by PCR POSITIVE (A) NEGATIVE Final    Comment:        The GeneXpert MRSA Assay (FDA approved for NASAL specimens only), is one component of a comprehensive MRSA colonization surveillance program. It is not intended to diagnose MRSA infection nor to guide or monitor treatment for MRSA infections. RESULT CALLED TO, READ BACK BY AND VERIFIED WITH: ALECIA RAWLINS AT 3220 ON 11/05/17 RWW Performed at [facility], [address] [place], [place] [address]   Culture, blood (Routine X 2) w Reflex to ID Panel     Status: None   Collection Time: 11/05/17  6:27 AM  Result Value Ref Range Status   Specimen Description BLOOD RIGHT WRIST  Final   Special Requests   Final    BOTTLES DRAWN AEROBIC AND ANAEROBIC Blood Culture adequate volume   Culture   Final    NO GROWTH 5 DAYS Performed at [facility], [address] [place], [place] [address]    Report Status 11/10/2017 FINAL  Final  Culture, blood (Routine X 2) w Reflex to ID Panel     Status: None   Collection Time: 11/05/17  6:27 AM  Result Value Ref Range Status   Specimen Description BLOOD RIGHT  HAND  Final   Special Requests   Final    BOTTLES DRAWN AEROBIC AND ANAEROBIC Blood Culture adequate volume   Culture   Final    NO GROWTH 5 DAYS Performed at [facility], [address], [place], [place] [address]    Report Status 11/10/2017 FINAL  Final  Culture, respiratory (NON-Expectorated)     Status: None   Collection Time: 11/05/17  3:26 PM  Result Value Ref Range Status   Specimen Description   Final    TRACHEAL ASPIRATE Performed at [facility], [address], [place], [place] [address]    Special Requests   Final    NONE Performed at [facility], [address] [place], [place] [address]    Gram Stain   Final    FEW WBC PRESENT, PREDOMINANTLY MONONUCLEAR NO ORGANISMS SEEN    Culture   Final    RARE Consistent with normal respiratory flora. Performed at [facility], [place] [address], [place], [place] [address]    Report Status 11/08/2017 FINAL  Final  CSF culture     Status: None   Collection Time: 11/06/17 12:35 PM  Result Value Ref Range Status   Specimen Description   Final    CSF Performed at [facility], [address] [place], [place] [address]    Special Requests   Final    NONE Performed at [facility], [address] [place], [place] [address]    Gram Stain   Final    WBC PRESENT, PREDOMINANTLY MONONUCLEAR NO ORGANISMS SEEN CYTOSPIN SMEAR    Culture   Final    NO GROWTH 3 DAYS Performed at [facility], [place] [address] [place], [place] [address]    Report Status 11/11/2017 FINAL  Final  Virus culture     Status: None   Collection Time: 11/06/17 12:35 PM  Result Value Ref Range Status   Viral Culture Comment  Final    Comment: (NOTE) Preliminary Report: No virus isolated at 4 days.  Next report to follow after 7 days. Performed At: [facility] [place], [place] [id number] Rush Farmer MD UR:[id number]    Source of Sample CSF  Final    Comment:  Performed at [facility], [address] [place], [place] [address]      Scheduled Meds: . amLODipine  5 mg Oral Daily  . enoxaparin (LOVENOX) injection  40 mg Subcutaneous Q24H  . feeding supplement (ENSURE ENLIVE)  237 mL Oral TID BM  . imipramine  100 mg Oral QHS  . insulin aspart  0-15 Units Subcutaneous TID WC  . insulin aspart  0-5 Units Subcutaneous QHS  . [START ON 11/13/2017] insulin glargine  12 Units Subcutaneous Daily  . lidocaine (PF)  5 mL Other Once  . mouth rinse  15 mL Mouth Rinse BID  . metoprolol tartrate  25 mg Oral BID  . nystatin   Topical BID  . PARoxetine  40 mg Oral Daily  . QUEtiapine  300 mg Oral QHS   Continuous Infusions: . sodium chloride    . sodium chloride      Assessment/Plan:  1. Possible encephalitis.  Still with some delerium.  Antibiotics discontinued.  Patient's acyclovir was switched to Valtrex orally.  Sepsis ruled out with cultures being negative. 2. Acute encephalopathy with likely underlying dementia.  Patient Still delerious.  Still hard for the patient to focus on my questions.Marland Kitchen  Psychiatry restarted her psychiatric medications the other day.  May need another day or so with follow-up with psychiatry. 3. Accelerated hypertension and tachycardia.   Continue low-dose metoprolol and Norvasc 4. Acute kidney injury improved with IV fluid hydration 5. Hypokalemia and hypomagnesemia.  Replace potassium orally and magnesium replaced 6. Type 2 diabetes mellitus.  Increase Lantus started.  7. Seizure.  As per neurology Keppra is discontinued 8. Physical therapy recommended rehab.  Patient does not want to go to rehab.  Case discussed with son at the bedside yesterday.  Code Status:     Code Status Orders  (From admission, onward)        Start     Ordered   11/05/17 0220  Full code  Continuous     11/05/17 0220    Code Status History    Date Active Date Inactive Code Status Order ID Comments User Context   12/08/2015 17:50  12/09/2015 19:51 Full Code [id number]  Leonie Green, MD Inpatient     Family Communication: spoke with husband on the phone Disposition Plan: Physical therapy recommended rehab   Consultants:  Neurology  Antibiotics:  Valtrex  Time spent: 27 minutes  [place]

## 2017-11-13 LAB — GLUCOSE, CAPILLARY
Glucose-Capillary: 167 mg/dL — ABNORMAL HIGH (ref 65–99)
Glucose-Capillary: 271 mg/dL — ABNORMAL HIGH (ref 65–99)
Glucose-Capillary: 134 mg/dL — ABNORMAL HIGH (ref 65–99)

## 2017-11-13 LAB — CBC
HCT: 35.5 % (ref 35.0–47.0)
Hemoglobin: 12.2 g/dL (ref 12.0–16.0)
MCH: 30.8 pg (ref 26.0–34.0)
MCHC: 34.5 g/dL (ref 32.0–36.0)
MCV: 89.4 fL (ref 80.0–100.0)
PLATELETS: 208 10*3/uL (ref 150–440)
RBC: 3.97 MIL/uL (ref 3.80–5.20)
RDW: 13.1 % (ref 11.5–14.5)
WBC: 7.2 10*3/uL (ref 3.6–11.0)

## 2017-11-13 LAB — CREATININE, SERUM: Creatinine, Ser: 0.7 mg/dL (ref 0.44–1.00)

## 2017-11-13 MED ORDER — ENSURE ENLIVE PO LIQD: 237.0000 mL | Freq: Three times a day (TID) | ORAL | 0 refills | Status: AC

## 2017-11-13 MED ORDER — METOPROLOL TARTRATE 25 MG PO TABS: 25.0000 mg | ORAL_TABLET | Freq: Two times a day (BID) | ORAL | 0 refills | Status: AC

## 2017-11-13 MED ORDER — ETODOLAC 300 MG PO CAPS
500.0000 mg | ORAL_CAPSULE | Freq: Two times a day (BID) | ORAL | Status: DC
  Administered 2017-11-13: 15:00:00 500 mg via ORAL
  Filled 2017-11-13 (×2): qty 1

## 2017-11-13 MED ORDER — NYSTATIN 100000 UNIT/GM EX POWD: Freq: Two times a day (BID) | CUTANEOUS | 0 refills | Status: AC

## 2017-11-13 MED ORDER — INSULIN ASPART 100 UNIT/ML ~~LOC~~ SOLN: 3.0000 [IU] | Freq: Three times a day (TID) | SUBCUTANEOUS | 0 refills | Status: AC

## 2017-11-13 MED ORDER — ETODOLAC 300 MG PO CAPS: 300.0000 mg | ORAL_CAPSULE | Freq: Two times a day (BID) | ORAL | 0 refills | Status: AC

## 2017-11-13 MED ORDER — INSULIN GLARGINE 100 UNIT/ML ~~LOC~~ SOLN: 12.0000 [IU] | Freq: Every day | SUBCUTANEOUS | 0 refills | Status: AC

## 2017-11-13 MED ORDER — ACETAMINOPHEN 325 MG PO TABS: 650.0000 mg | ORAL_TABLET | ORAL | Status: AC | PRN

## 2017-11-13 MED ORDER — ALPRAZOLAM 0.5 MG PO TABS: 0.5000 mg | ORAL_TABLET | Freq: Four times a day (QID) | ORAL | 0 refills | Status: AC | PRN

## 2017-11-13 MED ORDER — AMLODIPINE BESYLATE 5 MG PO TABS: 5.0000 mg | ORAL_TABLET | Freq: Every day | ORAL | 0 refills | Status: AC

## 2017-11-13 NOTE — Progress Notes (Signed)
Patient ID: Yvette Tyler, female   DOB: 1946-03-08, 72 y.o.   MRN: [id number]  [facility] PROGRESS NOTE  MKAYLA STEELE BRA:[id number] DOB: 1946-02-20 DOA: 11/04/2017 PCP: Glendon Axe, MD  HPI/Subjective: Patient still complaining of not sleeping very well.  She complains that she has tendinitis and she takes a total of back at home.  Patient is a little bit easier to talk with today.  Objective: Vitals:   11/13/17 1052 11/13/17 1053  BP: (!) 152/67   Pulse:  72  Resp:    Temp:    SpO2:      Filed Weights   11/11/17 0100 11/12/17 0500 11/13/17 0454  Weight: 78.5 kg (173 lb) 78.4 kg (172 lb 13.5 oz) 79.4 kg (175 lb 0.7 oz)    ROS: Review of Systems  Unable to perform ROS: Acuity of condition  Respiratory: Negative for shortness of breath.   Cardiovascular: Negative for chest pain.  Gastrointestinal: Negative for abdominal pain.   Exam: Physical Exam  HENT:  Nose: No mucosal edema.  Mouth/Throat: No oropharyngeal exudate or posterior oropharyngeal edema.  Eyes: Conjunctivae, EOM and lids are normal. Pupils are equal, round, and reactive to light.  Neck: No JVD present. Carotid bruit is not present. No edema present. No thyroid mass and no thyromegaly present.  Cardiovascular: S1 normal and S2 normal. Exam reveals no gallop.  No murmur heard. Pulses:      Dorsalis pedis pulses are 2+ on the right side, and 2+ on the left side.  Respiratory: No respiratory distress. She has no wheezes. She has no rhonchi. She has no rales.  GI: Soft. Bowel sounds are normal. There is no tenderness.  Musculoskeletal:       Right ankle: She exhibits swelling.       Left ankle: She exhibits swelling.  Lymphadenopathy:    She has no cervical adenopathy.  Neurological: She is alert. No cranial nerve deficit.  Skin: Skin is warm. No rash noted. Nails show no clubbing.  Psychiatric: Her speech is tangential.      Data Reviewed: Basic Metabolic Panel: Recent Labs  Lab  11/07/17 0415 11/08/17 0504 11/08/17 2115 11/09/17 0720 11/11/17 0322 11/13/17 0407  NA 147* 142  --  136 135  --   K 3.9 3.4* 3.7 3.4* 3.2*  --   CL 115* 104  --  97* 96*  --   CO2 27 31  --  32 29  --   GLUCOSE 169* 90  --  175* 206*  --   BUN 15 10  --  7 17  --   CREATININE 0.68 0.60  --  0.55 1.03* 0.70  CALCIUM 8.1* 8.5*  --  8.8* 9.0  --   MG  --  1.7 1.8  --  1.8  --   PHOS  --  1.6* 2.0* 2.3*  --   --    Liver Function Tests: Recent Labs  Lab 11/07/17 0415  AST 22  ALT 22  ALKPHOS 56  BILITOT 0.2*  PROT 4.8*  ALBUMIN 2.4*   No results for input(s): LIPASE, AMYLASE in the last 168 hours. No results for input(s): AMMONIA in the last 168 hours. CBC: Recent Labs  Lab 11/07/17 0415 11/08/17 0504 11/13/17 0407  WBC 12.7* 12.4* 7.2  HGB 11.5* 13.1 12.2  HCT 33.6* 38.9 35.5  MCV 89.6 90.4 89.4  PLT 148* 153 208    CBG: Recent Labs  Lab 11/12/17 1210 11/12/17 1629 11/12/17 2117 11/13/17 8811  11/13/17 1223  GLUCAP 268* 96 194* 167* 271*    Recent Results (from the past 240 hour(s))  Urine Culture     Status: None   Collection Time: 11/04/17  5:02 PM  Result Value Ref Range Status   Specimen Description   Final    URINE, RANDOM Performed at [facility], [address], [place], [place] [address]    Special Requests   Final    NONE Performed at [facility], [address], [place], [place] [address]    Culture   Final    NO GROWTH Performed at [facility], [place] [address] [place], [place] [address]    Report Status 11/06/2017 FINAL  Final  Respiratory Panel by PCR     Status: None   Collection Time: 11/04/17  5:54 PM  Result Value Ref Range Status   Adenovirus NOT DETECTED NOT DETECTED Final   Coronavirus 229E NOT DETECTED NOT DETECTED Final   Coronavirus HKU1 NOT DETECTED NOT DETECTED Final   Coronavirus NL63 NOT DETECTED NOT DETECTED Final   Coronavirus OC43 NOT DETECTED NOT DETECTED Final   Metapneumovirus  NOT DETECTED NOT DETECTED Final   Rhinovirus / Enterovirus NOT DETECTED NOT DETECTED Final   Influenza A NOT DETECTED NOT DETECTED Final   Influenza B NOT DETECTED NOT DETECTED Final   Parainfluenza Virus 1 NOT DETECTED NOT DETECTED Final   Parainfluenza Virus 2 NOT DETECTED NOT DETECTED Final   Parainfluenza Virus 3 NOT DETECTED NOT DETECTED Final   Parainfluenza Virus 4 NOT DETECTED NOT DETECTED Final   Respiratory Syncytial Virus NOT DETECTED NOT DETECTED Final   Bordetella pertussis NOT DETECTED NOT DETECTED Final   Chlamydophila pneumoniae NOT DETECTED NOT DETECTED Final   Mycoplasma pneumoniae NOT DETECTED NOT DETECTED Final  MRSA PCR Screening     Status: Abnormal   Collection Time: 11/05/17  2:25 AM  Result Value Ref Range Status   MRSA by PCR POSITIVE (A) NEGATIVE Final    Comment:        The GeneXpert MRSA Assay (FDA approved for NASAL specimens only), is one component of a comprehensive MRSA colonization surveillance program. It is not intended to diagnose MRSA infection nor to guide or monitor treatment for MRSA infections. RESULT CALLED TO, READ BACK BY AND VERIFIED WITH: ALECIA RAWLINS AT 5361 ON 11/05/17 RWW Performed at [facility], [address] [place], [place] [address]   Culture, blood (Routine X 2) w Reflex to ID Panel     Status: None   Collection Time: 11/05/17  6:27 AM  Result Value Ref Range Status   Specimen Description BLOOD RIGHT WRIST  Final   Special Requests   Final    BOTTLES DRAWN AEROBIC AND ANAEROBIC Blood Culture adequate volume   Culture   Final    NO GROWTH 5 DAYS Performed at [facility], [address], [place], [place] [address]    Report Status 11/10/2017 FINAL  Final  Culture, blood (Routine X 2) w Reflex to ID Panel     Status: None   Collection Time: 11/05/17  6:27 AM  Result Value Ref Range Status   Specimen Description BLOOD RIGHT HAND  Final   Special Requests   Final    BOTTLES DRAWN AEROBIC AND  ANAEROBIC Blood Culture adequate volume   Culture   Final    NO GROWTH 5 DAYS Performed at [facility], [address] [place],  [address]    Report Status  11/10/2017 FINAL  Final  Culture, respiratory (NON-Expectorated)     Status: None   Collection Time: 11/05/17  3:26 PM  Result Value Ref Range Status   Specimen Description   Final    TRACHEAL ASPIRATE Performed at [facility], [address], [place], [place] [address]    Special Requests   Final    NONE Performed at [facility], [address] [place], [place] [address]    Gram Stain   Final    FEW WBC PRESENT, PREDOMINANTLY MONONUCLEAR NO ORGANISMS SEEN    Culture   Final    RARE Consistent with normal respiratory flora. Performed at [facility], [place] [address], [place], [place] [address]    Report Status 11/08/2017 FINAL  Final  CSF culture     Status: None   Collection Time: 11/06/17 12:35 PM  Result Value Ref Range Status   Specimen Description   Final    CSF Performed at [facility], [address], [place], [place] [address]    Special Requests   Final    NONE Performed at [facility], [address] [place], [place] [address]    Gram Stain   Final    WBC PRESENT, PREDOMINANTLY MONONUCLEAR NO ORGANISMS SEEN CYTOSPIN SMEAR    Culture   Final    NO GROWTH 3 DAYS Performed at [facility], [place] [address] [place], [place] [address]    Report Status 11/11/2017 FINAL  Final  Virus culture     Status: None   Collection Time: 11/06/17 12:35 PM  Result Value Ref Range Status   Viral Culture Comment  Final    Comment: (NOTE) Preliminary Report: No virus isolated at 4 days.  Next report to follow after 7 days. Performed At: [facility] [place], [place] [id number] Rush Farmer MD BM:[id number]    Source of Sample CSF  Final    Comment: Performed at [facility], [address] [place],  [place] [address]     Studies: No results found.  Scheduled Meds: . amLODipine  5 mg Oral Daily  . enoxaparin (LOVENOX) injection  40 mg Subcutaneous Q24H  . etodolac  500 mg Oral BID  . feeding supplement (ENSURE ENLIVE)  237 mL Oral TID BM  . imipramine  100 mg Oral QHS  . insulin aspart  0-15 Units Subcutaneous TID WC  . insulin aspart  0-5 Units Subcutaneous QHS  . insulin glargine  12 Units Subcutaneous Daily  . lidocaine (PF)  5 mL Other Once  . mouth rinse  15 mL Mouth Rinse BID  . metoprolol tartrate  25 mg Oral BID  . nystatin   Topical BID  . PARoxetine  40 mg Oral Daily  . QUEtiapine  300 mg Oral QHS   Continuous Infusions: . sodium chloride    . sodium chloride      Assessment/Plan:  1. Encephalitis.  Patient seems better than a few days ago.  Patient is on Valtrex orally.  Was on IV acyclovir prior to that.  Sepsis ruled out with cultures being negative. 2. Acute encephalopathy.  Could be secondary to psychiatric issues with being sick in the hospital.  Would like psychiatric reevaluation prior to disposition to rehab.  Husband told stories of patient being forgetful and leaving things on the stove.  Patient was able to talk with me a little bit better today than previous days. 3. Accelerated hypertension and tachycardia on metoprolol  and Norvasc 4. Acute kidney injury improved with IV fluid hydration 5. Hypokalemia and hypomagnesemia replaced orally 6. Type 2 diabetes mellitus on Lantus and sliding scale 7. Seizure first time.  Was on Keppra initially and then this was discontinued by neurology. 8. Physical therapy recommended rehab for her weakness.  Code Status:     Code Status Orders  (From admission, onward)        Start     Ordered   11/05/17 0220  Full code  Continuous     11/05/17 0220    Code Status History    Date Active Date Inactive Code Status Order ID Comments User Context   12/08/2015 17:50 12/09/2015 19:51 Full Code [id number]  Leonie Green, MD Inpatient     Disposition Plan: Potentially to rehab late this afternoon or tomorrow but this is also dependent on psychiatric reevaluation  Time spent: 31 minutes  [place]

## 2017-11-13 NOTE — Progress Notes (Signed)
Report called and given to [place] at [facility]. EMS called for transport. IV removed. Will get pt dressed for transfer.

## 2017-11-13 NOTE — Discharge Summary (Signed)
[facility] NAME: Yvette Tyler    MR#:  [id number]  DATE OF BIRTH:  25-Jun-1946  DATE OF ADMISSION:  11/04/2017 ADMITTING PHYSICIAN: Lance Coon, MD  DATE OF DISCHARGE: 11/13/2017  PRIMARY CARE PHYSICIAN: Glendon Axe, MD    ADMISSION DIAGNOSIS:  Encephalopathy acute [G93.40] Sepsis, due to unspecified organism ([place]) [A41.9]  DISCHARGE DIAGNOSIS:  Principal Problem:   Generalized anxiety disorder Active Problems:   Acute encephalopathy   HTN (hypertension)   HLD (hyperlipidemia)   Diabetes ([place])   Leukocytosis   History of stroke   Sepsis ([place])   Meningitis   Hyperglycemic hyperosmolar nonketotic coma ([place])   SECONDARY DIAGNOSIS:   Past Medical History:  Diagnosis Date  . Anxiety   . Cancer ([place])   . Depression   . Diabetes mellitus without complication ([place])   . DVT (deep vein thrombosis) in pregnancy ([employer])   . Hyperlipidemia   . Hypertension   . Insomnia   . Osteoarthritis   . Rectal bleeding   . Stroke ([place])   . Suicidal ideation     HOSPITAL COURSE:   1.  Acute encephalitis likely viral in nature.  Patient was on IV acyclovir and then switch to oral Valtrex and completed a couple weeks course.  Sepsis ruled out with cultures being negative.  Antibiotics were discontinued. 2.  Acute encephalopathy.  Could be secondary to psychiatric issues with being sick in the hospital.  Patient was seen again by Dr. Carlena Hurl psychiatry and he cleared to go home.  Patient is easier to talk with today and able to focus a little bit better than previous days. 3.  Accelerated hypertension tachycardia on metoprolol and Norvasc 4.  Acute kidney injury improved with IV fluid hydration. 5.  Hypokalemia and hypomagnesemia this was replaced during the hospital course. 6.  Type 2 diabetes mellitus on Lantus insulin and 3 units prior to meals. 7.  Seizure for the first time.  Patient was initially started on Keppra and then  neurology decided that the patient can be off anticonvulsants at this point since this was a first-time seizure and likely brought on by infection. 8.  Physical therapy recommended rehab for weakness. 9.  Hypernatremia improved with IV fluid hydration  Recommend checking a BMP and magnesium level on Thursday  DISCHARGE CONDITIONS:   Fair  CONSULTS OBTAINED:  Treatment Team:  Hermelinda Dellen, DO Leotis Pain, MD Clapacs, Madie Reno, MD  DRUG ALLERGIES:   Allergies  Allergen Reactions  . Hydrocodone-Acetaminophen Other (See Comments)    Increased dream activity  . Meperidine Other (See Comments)    Rapid heart rate  . Penicillins Rash    Has patient had a PCN reaction causing immediate rash, facial/tongue/throat swelling, SOB or lightheadedness with hypotension: Yes Has patient had a PCN reaction causing severe rash involving mucus membranes or skin necrosis: No Has patient had a PCN reaction that required hospitalization: No Has patient had a PCN reaction occurring within the last 10 years: No If all of the above answers are "NO", then may proceed with Cephalosporin use.     DISCHARGE MEDICATIONS:   Allergies as of 11/13/2017      Reactions   Hydrocodone-acetaminophen Other (See Comments)   Increased dream activity   Meperidine Other (See Comments)   Rapid heart rate   Penicillins Rash   Has patient had a PCN reaction causing immediate rash, facial/tongue/throat swelling, SOB or lightheadedness with hypotension: Yes Has patient had a PCN  reaction causing severe rash involving mucus membranes or skin necrosis: No Has patient had a PCN reaction that required hospitalization: No Has patient had a PCN reaction occurring within the last 10 years: No If all of the above answers are "NO", then may proceed with Cephalosporin use.      Medication List    STOP taking these medications   glimepiride 4 MG tablet Commonly known as:  AMARYL   temazepam 15 MG capsule Commonly  known as:  RESTORIL     TAKE these medications   acetaminophen 325 MG tablet Commonly known as:  TYLENOL Take 2 tablets (650 mg total) by mouth every 4 (four) hours as needed for mild pain (temp > 101.5).   ALPRAZolam 0.5 MG tablet Commonly known as:  XANAX Take 1 tablet (0.5 mg total) by mouth every 6 (six) hours as needed for anxiety. What changed:  when to take this   amLODipine 5 MG tablet Commonly known as:  NORVASC Take 1 tablet (5 mg total) by mouth daily. Start taking on:  11/14/2017   etodolac 300 MG capsule Commonly known as:  LODINE Take 1 capsule (300 mg total) by mouth 2 (two) times daily.   feeding supplement (ENSURE ENLIVE) Liqd Take 237 mLs by mouth 3 (three) times daily between meals.   imipramine 50 MG tablet Commonly known as:  TOFRANIL Take 2 tablets by mouth at bedtime.   insulin aspart 100 UNIT/ML injection Commonly known as:  novoLOG Inject 3 Units into the skin 3 (three) times daily with meals.   insulin glargine 100 UNIT/ML injection Commonly known as:  LANTUS Inject 0.12 mLs (12 Units total) into the skin daily. Start taking on:  11/14/2017   metoprolol tartrate 25 MG tablet Commonly known as:  LOPRESSOR Take 1 tablet (25 mg total) by mouth 2 (two) times daily.   nystatin powder Commonly known as:  MYCOSTATIN/NYSTOP Apply topically 2 (two) times daily.   ondansetron 4 MG tablet Commonly known as:  ZOFRAN Take 1 tablet by mouth every 8 (eight) hours as needed for nausea.   PARoxetine 40 MG tablet Commonly known as:  PAXIL Take 1 tablet by mouth daily.   SEROQUEL 300 MG tablet Generic drug:  QUEtiapine Take 1 tablet by mouth at bedtime.        DISCHARGE INSTRUCTIONS:   Follow-up with doctors at rehab 1 day  If you experience worsening of your admission symptoms, develop shortness of breath, life threatening emergency, suicidal or homicidal thoughts you must seek medical attention immediately by calling 911 or calling your MD  immediately  if symptoms less severe.  You Must read complete instructions/literature along with all the possible adverse reactions/side effects for all the Medicines you take and that have been prescribed to you. Take any new Medicines after you have completely understood and accept all the possible adverse reactions/side effects.   Please note  You were cared for by a hospitalist during your hospital stay. If you have any questions about your discharge medications or the care you received while you were in the hospital after you are discharged, you can call the unit and asked to speak with the hospitalist on call if the hospitalist that took care of you is not available. Once you are discharged, your primary care physician will handle any further medical issues. Please note that NO REFILLS for any discharge medications will be authorized once you are discharged, as it is imperative that you return to your primary care physician (or establish  a relationship with a primary care physician if you do not have one) for your aftercare needs so that they can reassess your need for medications and monitor your lab values.    Today   CHIEF COMPLAINT:   Chief Complaint  Patient presents with  . Altered Mental Status    HISTORY OF PRESENT ILLNESS:  Nattaly Yebra  is a 72 y.o. female brought in with altered mental status   VITAL SIGNS:  Blood pressure (!) 152/67, pulse 72, temperature 98.2 F (36.8 C), temperature source Oral, resp. rate 16, height 5\' 7"  (1.702 m), weight 79.4 kg (175 lb 0.7 oz), SpO2 94 %.    PHYSICAL EXAMINATION:  GENERAL:  72 y.o.-year-old patient lying in the bed with no acute distress.  EYES: Pupils equal, round, reactive to light and accommodation. No scleral icterus. Extraocular muscles intact.  HEENT: Head atraumatic, normocephalic. Oropharynx and nasopharynx clear.  NECK:  Supple, no jugular venous distention. No thyroid enlargement, no tenderness.  LUNGS: Normal  breath sounds bilaterally, no wheezing, rales,rhonchi or crepitation. No use of accessory muscles of respiration.  CARDIOVASCULAR: S1, S2 normal. No murmurs, rubs, or gallops.  ABDOMEN: Soft, non-tender, non-distended. Bowel sounds present. No organomegaly or mass.  EXTREMITIES: Trace edema, no cyanosis, or clubbing.  NEUROLOGIC: Cranial nerves II through XII are intact. Muscle strength 5/5 in all extremities. Sensation intact. Gait not checked.  PSYCHIATRIC: The patient is alert and oriented x 3.  SKIN: No obvious rash, lesion, or ulcer.   DATA REVIEW:   CBC Recent Labs  Lab 11/13/17 0407  WBC 7.2  HGB 12.2  HCT 35.5  PLT 208    Chemistries  Recent Labs  Lab 11/07/17 0415  11/11/17 0322 11/13/17 0407  NA 147*   < > 135  --   K 3.9   < > 3.2*  --   CL 115*   < > 96*  --   CO2 27   < > 29  --   GLUCOSE 169*   < > 206*  --   BUN 15   < > 17  --   CREATININE 0.68   < > 1.03* 0.70  CALCIUM 8.1*   < > 9.0  --   MG  --    < > 1.8  --   AST 22  --   --   --   ALT 22  --   --   --   ALKPHOS 56  --   --   --   BILITOT 0.2*  --   --   --    < > = values in this interval not displayed.     Microbiology Results  Results for orders placed or performed during the hospital encounter of 11/04/17  Urine Culture     Status: None   Collection Time: 11/04/17  5:02 PM  Result Value Ref Range Status   Specimen Description   Final    URINE, RANDOM Performed at [facility], [address], [place], [place] [address]    Special Requests   Final    NONE Performed at [facility], [address], [place], [place] [address]    Culture   Final    NO GROWTH Performed at [facility], [place] [address], [place], [place] [address]    Report Status 11/06/2017 FINAL  Final  Respiratory Panel by PCR     Status: None   Collection Time: 11/04/17  5:54 PM  Result Value  Ref Range Status   Adenovirus NOT DETECTED NOT DETECTED Final   Coronavirus 229E NOT DETECTED NOT  DETECTED Final   Coronavirus HKU1 NOT DETECTED NOT DETECTED Final   Coronavirus NL63 NOT DETECTED NOT DETECTED Final   Coronavirus OC43 NOT DETECTED NOT DETECTED Final   Metapneumovirus NOT DETECTED NOT DETECTED Final   Rhinovirus / Enterovirus NOT DETECTED NOT DETECTED Final   Influenza A NOT DETECTED NOT DETECTED Final   Influenza B NOT DETECTED NOT DETECTED Final   Parainfluenza Virus 1 NOT DETECTED NOT DETECTED Final   Parainfluenza Virus 2 NOT DETECTED NOT DETECTED Final   Parainfluenza Virus 3 NOT DETECTED NOT DETECTED Final   Parainfluenza Virus 4 NOT DETECTED NOT DETECTED Final   Respiratory Syncytial Virus NOT DETECTED NOT DETECTED Final   Bordetella pertussis NOT DETECTED NOT DETECTED Final   Chlamydophila pneumoniae NOT DETECTED NOT DETECTED Final   Mycoplasma pneumoniae NOT DETECTED NOT DETECTED Final  MRSA PCR Screening     Status: Abnormal   Collection Time: 11/05/17  2:25 AM  Result Value Ref Range Status   MRSA by PCR POSITIVE (A) NEGATIVE Final    Comment:        The GeneXpert MRSA Assay (FDA approved for NASAL specimens only), is one component of a comprehensive MRSA colonization surveillance program. It is not intended to diagnose MRSA infection nor to guide or monitor treatment for MRSA infections. RESULT CALLED TO, READ BACK BY AND VERIFIED WITH: ALECIA RAWLINS AT 0536 ON 11/05/17 RWW Performed at [facility], [address] [place], [place] [address]   Culture, blood (Routine X 2) w Reflex to ID Panel     Status: None   Collection Time: 11/05/17  6:27 AM  Result Value Ref Range Status   Specimen Description BLOOD RIGHT WRIST  Final   Special Requests   Final    BOTTLES DRAWN AEROBIC AND ANAEROBIC Blood Culture adequate volume   Culture   Final    NO GROWTH 5 DAYS Performed at [facility], [address], [place], [place] [address]    Report Status 11/10/2017 FINAL  Final  Culture, blood (Routine X 2) w Reflex to ID Panel      Status: None   Collection Time: 11/05/17  6:27 AM  Result Value Ref Range Status   Specimen Description BLOOD RIGHT HAND  Final   Special Requests   Final    BOTTLES DRAWN AEROBIC AND ANAEROBIC Blood Culture adequate volume   Culture   Final    NO GROWTH 5 DAYS Performed at [facility], [address], [place], [place] [address]    Report Status 11/10/2017 FINAL  Final  Culture, respiratory (NON-Expectorated)     Status: None   Collection Time: 11/05/17  3:26 PM  Result Value Ref Range Status   Specimen Description   Final    TRACHEAL ASPIRATE Performed at [facility], [address], [place], [place] [address]    Special Requests   Final    NONE Performed at [facility], [address] [place], [place] [address]    Gram Stain   Final    FEW WBC PRESENT, PREDOMINANTLY MONONUCLEAR NO ORGANISMS SEEN    Culture   Final    RARE Consistent with normal respiratory flora. Performed at [facility], [place] [address] [place], [place] [address]    Report Status 11/08/2017 FINAL  Final  CSF culture     Status: None   Collection Time:  11/06/17 12:35 PM  Result Value Ref Range Status   Specimen Description   Final    CSF Performed at [facility], [address] [place], [place] [address]    Special Requests   Final    NONE Performed at [facility], [address] [place], [place] [address]    Gram Stain   Final    WBC PRESENT, PREDOMINANTLY MONONUCLEAR NO ORGANISMS SEEN CYTOSPIN SMEAR    Culture   Final    NO GROWTH 3 DAYS Performed at [facility], [place] [address], [place], [place] [address]    Report Status 11/11/2017 FINAL  Final  Virus culture     Status: None   Collection Time: 11/06/17 12:35 PM  Result Value Ref Range Status   Viral Culture Comment  Final    Comment: (NOTE) Preliminary Report: No virus isolated at 4 days.  Next report to follow after 7 days. Performed At: [facility] [place], [place] [id number] Rush Farmer MD HE:[id number]    Source of Sample CSF  Final    Comment: Performed at [facility], [address] [place], [place] [address]     Management plans discussed with the patient, family and they are in agreement.  CODE STATUS:     Code Status Orders  (From admission, onward)        Start     Ordered   11/05/17 0220  Full code  Continuous     11/05/17 0220    Code Status History    Date Active Date Inactive Code Status Order ID Comments User Context   12/08/2015 17:50 12/09/2015 19:51 Full Code [id number]  Leonie Green, MD Inpatient      TOTAL TIME TAKING CARE OF THIS PATIENT: 35 minutes.    Loletha Grayer M.D on 11/13/2017 at 3:23 PM  Between 7am to 6pm - Pager - [phone]  After 6pm go to [email or web address] - password [email or web address] [facility]  [phone]  CC: Primary care physician; Glendon Axe, MD

## 2017-11-13 NOTE — Care Management Important Message (Signed)
Important Message  Patient Details  Name: Yvette Tyler MRN: [id number] Date of Birth: [birth year]   Medicare Important Message Given:  Yes  Initial not on file.   Marshell Garfinkel, RN 11/13/2017, 9:21 AM

## 2017-11-13 NOTE — Progress Notes (Addendum)
Patient is medically stable for D/C to [facility] today. Per [facility] admissions coordinator at [facility] patient can come today. RN will call report and arrange EMS for transport. Clinical Education officer, museum (CSW) sent D/C orders to [facility] via [place]. Patient is aware of above. CSW attempted to contact patient's on [name] and husband Yvette Tyler with no success. Please reconsult if future social work needs arise. CSW signing off.   Patient's husband Yvette Tyler called CSW back and is in agreement with plan.   [employer], LCSW [phone]

## 2017-11-13 NOTE — Clinical Social Work Placement (Addendum)
   CLINICAL SOCIAL WORK PLACEMENT  NOTE  Date:  11/13/2017  Patient Details  Name: Yvette Tyler MRN: [id number] Date of Birth: November 08, 1945  Clinical Social Work is seeking post-discharge placement for this patient at the [place] level of care (*CSW will initial, date and re-position this form in  chart as items are completed):  Yes   Patient/family provided with [facility] Work Department's list of facilities offering this level of care within the geographic area requested by the patient (or if unable, by the patient's family).  Yes   Patient/family informed of their freedom to choose among providers that offer the needed level of care, that participate in Medicare, Medicaid or managed care program needed by the patient, have an available bed and are willing to accept the patient.  Yes   Patient/family informed of [facility]'s ownership interest in [facility] and [facility], as well as of the fact that they are under no obligation to receive care at these facilities.  PASRR submitted to EDS on 11/09/17     PASRR number received on 11/09/17     Existing PASRR number confirmed on       FL2 transmitted to all facilities in geographic area requested by pt/family on 11/09/17     FL2 transmitted to all facilities within larger geographic area on       Patient informed that his/her managed care company has contracts with or will negotiate with certain facilities, including the following:        Yes   Patient/family informed of bed offers received.  Patient chooses bed at [facility] )     Physician recommends and patient chooses bed at      Patient to be transferred to [facility] ) on 11/13/17.  Patient to be transferred to facility by [facility] )     Patient family notified on 11/13/17 of transfer.  Name of family member notified:  (CSW attempted to contact patient's son [name] and husband Delfino Lovett wtih no  success. ) Patient's husband Delfino Lovett called CSW back and is in agreement with plan.   PHYSICIAN       Additional Comment:    _______________________________________________ Angellee Cohill, Veronia Beets, LCSW 11/13/2017, 3:47 PM

## 2017-11-13 NOTE — Consult Note (Signed)
[facility] Consult   Reason for Consult: Follow-up consult for 72 year old woman in the hospital with an altered mental status Referring Physician:  Leslye Peer Patient Identification: Yvette Tyler MRN:  [id number] Principal Diagnosis: Generalized anxiety disorder Diagnosis:   Patient Active Problem List   Diagnosis Date Noted  . Generalized anxiety disorder [F41.1] 11/09/2017  . Meningitis [G03.9]   . Hyperglycemic hyperosmolar nonketotic coma ([place]) [E11.01, E11.65]   . Acute encephalopathy [G93.40] 11/04/2017  . HTN (hypertension) [I10] 11/04/2017  . HLD (hyperlipidemia) [E78.5] 11/04/2017  . Diabetes ([place]) [E11.9] 11/04/2017  . Leukocytosis [D72.829] 11/04/2017  . History of stroke [Z86.73] 11/04/2017  . Sepsis ([place]) [A41.9] 11/04/2017  . Primary cancer of upper inner quadrant of left female breast [facility]) [C50.212] 11/13/2015    Total Time spent with patient: 30 minutes  Subjective:   Yvette Tyler is a 72 y.o. female patient admitted with "they said they wanted to know what you thought before I left".  Follow-up note for this 73 year old woman with a long psychiatric history.  Patient says she is feeling generally better.  As appears to be her usual pattern, once she gets going she has a multitude of complaints that she would like to discuss many of them involving her husband, but I was easily able to redirect her back to just the basics of her symptoms.  She is not currently noticing any severe memory impairment.  She is not reporting depression or any suicidal thoughts and does not appear to be having any psychotic symptoms.  Still sleeping poorly at night.  She was able to calm down and not show pressured speech or any disorganized thinking.  She had a lot of anxiety because apparently someone had told her there was a concern that she might have "dementia".  I explained to her that dementia was a relative thing.  We went through a little basic memory testing and I  reassured her that I did not think that she had a significant or worsening degree of dementia to worry about.Marland Kitchen  HPI: Patient presented with an acute altered mental status that still seems to be a little unclear although probably was a seizure.  Past Psychiatric History: Long-standing mental health problems dominated by anxiety and moodiness and irritability  Risk to Self: Is patient at risk for suicide?: No Risk to Others:   Prior Inpatient Therapy:   Prior Outpatient Therapy:    Past Medical History:  Past Medical History:  Diagnosis Date  . Anxiety   . Cancer ([place])   . Depression   . Diabetes mellitus without complication ([place])   . DVT (deep vein thrombosis) in pregnancy ([place])   . Hyperlipidemia   . Hypertension   . Insomnia   . Osteoarthritis   . Rectal bleeding   . Stroke ([place])   . Suicidal ideation     Past Surgical History:  Procedure Laterality Date  . AXILLARY LYMPH NODE DISSECTION Left 12/08/2015   Procedure: AXILLARY LYMPH NODE DISSECTION;  Surgeon: Leonie Green, MD;  Location: [facility] ORS;  Service: General;  Laterality: Left;  POSSIBLE AXILLARY NODE DISSECTION  . BREAST BIOPSY Left 10/27/2015   path pending  . COLONOSCOPY    . MASTECTOMY Left 12/08/2015  . PARTIAL MASTECTOMY WITH AXILLARY SENTINEL LYMPH NODE BIOPSY Left 12/08/2015   Procedure: LEFT MASTECTOMY WITH AXILLARY SENTINEL LYMPH NODE BIOPSY;  Surgeon: Leonie Green, MD;  Location: [facility] ORS;  Service: General;  Laterality: Left;  Marland Kitchen VEIN BYPASS SURGERY  Family History:  Family History  Problem Relation Age of Onset  . Pancreatic cancer Father 65  . Colon cancer Paternal Uncle   . Pancreatic cancer Unknown   . Heart failure Mother   . Breast cancer Mother    Family Psychiatric  History: Positive for anxiety Social History:  Social History   Substance and Sexual Activity  Alcohol Use No     Social History   Substance and Sexual Activity  Drug Use No    Social History    Socioeconomic History  . Marital status: Married    Spouse name: None  . Number of children: None  . Years of education: None  . Highest education level: None  Social Needs  . Financial resource strain: None  . Food insecurity - worry: None  . Food insecurity - inability: None  . Transportation needs - medical: None  . Transportation needs - non-medical: None  Occupational History  . None  Tobacco Use  . Smoking status: Never Smoker  . Smokeless tobacco: Never Used  Substance and Sexual Activity  . Alcohol use: No  . Drug use: No  . Sexual activity: None  Other Topics Concern  . None  Social History Narrative  . None   Additional Social History:    Allergies:   Allergies  Allergen Reactions  . Hydrocodone-Acetaminophen Other (See Comments)    Increased dream activity  . Meperidine Other (See Comments)    Rapid heart rate  . Penicillins Rash    Has patient had a PCN reaction causing immediate rash, facial/tongue/throat swelling, SOB or lightheadedness with hypotension: Yes Has patient had a PCN reaction causing severe rash involving mucus membranes or skin necrosis: No Has patient had a PCN reaction that required hospitalization: No Has patient had a PCN reaction occurring within the last 10 years: No If all of the above answers are "NO", then may proceed with Cephalosporin use.     Labs:  Results for orders placed or performed during the hospital encounter of 11/04/17 (from the past 48 hour(s))  Glucose, capillary     Status: None   Collection Time: 11/11/17  4:50 PM  Result Value Ref Range   Glucose-Capillary 74 65 - 99 mg/dL   Comment 1 Notify RN   Glucose, capillary     Status: Abnormal   Collection Time: 11/11/17  9:45 PM  Result Value Ref Range   Glucose-Capillary 288 (H) 65 - 99 mg/dL   Comment 1 Notify RN   Glucose, capillary     Status: Abnormal   Collection Time: 11/12/17  7:41 AM  Result Value Ref Range   Glucose-Capillary 189 (H) 65 - 99  mg/dL   Comment 1 Notify RN   Glucose, capillary     Status: Abnormal   Collection Time: 11/12/17 12:10 PM  Result Value Ref Range   Glucose-Capillary 268 (H) 65 - 99 mg/dL   Comment 1 Notify RN   Glucose, capillary     Status: None   Collection Time: 11/12/17  4:29 PM  Result Value Ref Range   Glucose-Capillary 96 65 - 99 mg/dL   Comment 1 Notify RN   Glucose, capillary     Status: Abnormal   Collection Time: 11/12/17  9:17 PM  Result Value Ref Range   Glucose-Capillary 194 (H) 65 - 99 mg/dL   Comment 1 Notify RN   CBC     Status: None   Collection Time: 11/13/17  4:07 AM  Result Value Ref Range  WBC 7.2 3.6 - 11.0 K/uL   RBC 3.97 3.80 - 5.20 MIL/uL   Hemoglobin 12.2 12.0 - 16.0 g/dL   HCT 35.5 35.0 - 47.0 %   MCV 89.4 80.0 - 100.0 fL   MCH 30.8 26.0 - 34.0 pg   MCHC 34.5 32.0 - 36.0 g/dL   RDW 13.1 11.5 - 14.5 %   Platelets 208 150 - 440 K/uL    Comment: Performed at [facility], [address] [place], [place] [address]  Creatinine, serum     Status: None   Collection Time: 11/13/17  4:07 AM  Result Value Ref Range   Creatinine, Ser 0.70 0.44 - 1.00 mg/dL   GFR calc non Af Amer >60 >60 mL/min   GFR calc Af Amer >60 >60 mL/min    Comment: (NOTE) The eGFR has been calculated using the CKD EPI equation. This calculation has not been validated in all clinical situations. eGFR's persistently <60 mL/min signify possible Chronic Kidney Disease. Performed at [facility], [address] [place],  [address]   Glucose, capillary     Status: Abnormal   Collection Time: 11/13/17  7:52 AM  Result Value Ref Range   Glucose-Capillary 167 (H) 65 - 99 mg/dL   Comment 1 Notify RN   Glucose, capillary     Status: Abnormal   Collection Time: 11/13/17 12:23 PM  Result Value Ref Range   Glucose-Capillary 271 (H) 65 - 99 mg/dL   Comment 1 Notify RN     Current Facility-Administered Medications  Medication Dose Route Frequency Provider Last Rate Last  Dose  . 0.9 %  sodium chloride infusion  250 mL Intravenous PRN Lance Coon, MD      . acetaminophen (TYLENOL) tablet 650 mg  650 mg Oral Q4H PRN Lance Coon, MD   650 mg at 11/12/17 2107  . ALPRAZolam Duanne Moron) tablet 0.5 mg  0.5 mg Oral Q6H PRN Calyn Rubi, Madie Reno, MD   0.5 mg at 11/13/17 0331  . amLODipine (NORVASC) tablet 5 mg  5 mg Oral Daily Loletha Grayer, MD   5 mg at 11/13/17 1052  . enoxaparin (LOVENOX) injection 40 mg  40 mg Subcutaneous Q24H Lance Coon, MD   40 mg at 11/12/17 2107  . etodolac (LODINE) capsule 500 mg  500 mg Oral BID Loletha Grayer, MD   500 mg at 11/13/17 1524  . feeding supplement (ENSURE ENLIVE) (ENSURE ENLIVE) liquid 237 mL  237 mL Oral TID BM Wilhelmina Mcardle, MD   237 mL at 11/13/17 1408  . hydrALAZINE (APRESOLINE) injection 10 mg  10 mg Intravenous Q4H PRN Wieting, Richard, MD      . imipramine (TOFRANIL) tablet 100 mg  100 mg Oral QHS Christinamarie Tall, Madie Reno, MD   100 mg at 11/12/17 2107  . insulin aspart (novoLOG) injection 0-15 Units  0-15 Units Subcutaneous TID WC Wilhelmina Mcardle, MD   8 Units at 11/13/17 1233  . insulin aspart (novoLOG) injection 0-5 Units  0-5 Units Subcutaneous QHS Wilhelmina Mcardle, MD   3 Units at 11/11/17 2155  . insulin glargine (LANTUS) injection 12 Units  12 Units Subcutaneous Daily Loletha Grayer, MD   12 Units at 11/13/17 1053  . ipratropium-albuterol (DUONEB) 0.5-2.5 (3) MG/3ML nebulizer solution 3 mL  3 mL Nebulization Q4H PRN Merton Border B, MD      . lidocaine (PF) (XYLOCAINE) 1 % injection 5 mL  5 mL Other Once Conforti, Amiri Riechers, DO      .  MEDLINE mouth rinse  15 mL Mouth Rinse BID Wilhelmina Mcardle, MD   15 mL at 11/13/17 1054  . metoprolol tartrate (LOPRESSOR) injection 2.5-5 mg  2.5-5 mg Intravenous Q3H PRN Wilhelmina Mcardle, MD      . metoprolol tartrate (LOPRESSOR) tablet 25 mg  25 mg Oral BID Loletha Grayer, MD   25 mg at 11/13/17 1053  . nystatin (MYCOSTATIN/NYSTOP) topical powder   Topical BID Dorene Sorrow S, NP       . ondansetron (ZOFRAN) injection 4 mg  4 mg Intravenous Q6H PRN Lance Coon, MD   4 mg at 11/09/17 1222  . PARoxetine (PAXIL) tablet 40 mg  40 mg Oral Daily Kia Varnadore, Madie Reno, MD   40 mg at 11/13/17 1053  . QUEtiapine (SEROQUEL) tablet 300 mg  300 mg Oral QHS Toma Erichsen, Madie Reno, MD   300 mg at 11/12/17 2107  . sodium chloride 0.9 % bolus 1,000 mL  1,000 mL Intravenous PRN Lance Coon, MD        Musculoskeletal: Strength & Muscle Tone: within normal limits Gait & Station: unsteady Patient leans: N/A  Psychiatric Specialty Exam: Physical Exam  Nursing note and vitals reviewed. Constitutional: She appears well-developed and well-nourished.  HENT:  Head: Normocephalic and atraumatic.  Eyes: Conjunctivae are normal. Pupils are equal, round, and reactive to light.  Neck: Normal range of motion.  Cardiovascular: Regular rhythm and normal heart sounds.  Respiratory: Effort normal. No respiratory distress.  GI: Soft.  Musculoskeletal: Normal range of motion.  Neurological: She is alert.  Skin: Skin is warm and dry.  Psychiatric: Her speech is normal and behavior is normal. Judgment and thought content normal. Cognition and memory are normal.    Review of Systems  Constitutional: Negative.   HENT: Negative.   Eyes: Negative.   Respiratory: Negative.   Cardiovascular: Negative.   Gastrointestinal: Negative.   Musculoskeletal: Negative.   Skin: Negative.   Neurological: Negative.   Psychiatric/Behavioral: Negative for depression, hallucinations, memory loss, substance abuse and suicidal ideas. The patient is nervous/anxious and has insomnia.     Blood pressure (!) 152/67, pulse 72, temperature 98.2 F (36.8 C), temperature source Oral, resp. rate 16, height _0  (1.702 m), weight 79.4 kg (175 lb 0.7 oz), SpO2 94 %.Body mass index is 27.42 kg/m.  General Appearance: Casual  Eye Contact:  Good  Speech:  Normal Rate  Volume:  Normal  Mood:  Anxious  Affect:  Congruent  Thought  Process:  Goal Directed  Orientation:  Full (Time, Place, and Person)  Thought Content:  Logical  Suicidal Thoughts:  No  Homicidal Thoughts:  No  Memory:  Immediate;   Fair Recent;   Fair Remote;   Fair  Judgement:  Fair  Insight:  Fair  Psychomotor Activity:  Normal  Concentration:  Concentration: Fair  Recall:  [employer] of Knowledge:  Fair  Language:  Fair  Akathisia:  No  Handed:  Right  AIMS (if indicated):     Assets:  Communication Skills Housing Physical Health  ADL's:  Impaired  Cognition:  Impaired,  Mild  Sleep:        Treatment Plan Summary: Medication management and Plan 72 year old woman with chronic anxiety.  She seems to be most likely back to her baseline or very close to it.  She was completely alert and oriented and oriented to her situation.  She understood that the plan was for her to go to rehab and she understood the reasoning for it.  She could remember 2 out of 3 words at 3 minutes and could hold a lucid conversation about past and recent events.  I reassured her that I did not think she had a significant degree of dementia.  She appears to be tolerating her medicine well.  Did a little supportive counseling and encouraged her not to get caught up and anxiety about her husband until she has regained her own physical condition.  Patient is agreeable to going to rehab.  No need to make any further changes to medicine.  Spoke with nursing about this.  She can be discharged on current medicine as far as I am concerned.  Disposition: No evidence of imminent risk to self or others at present.   Patient does not meet criteria for psychiatric inpatient admission. Supportive therapy provided about ongoing stressors.  Alethia Berthold, MD 11/13/2017 4:10 PM

## 2017-11-13 NOTE — Progress Notes (Signed)
  Speech Language Pathology Treatment: Dysphagia  Patient Details Name: Yvette Tyler MRN: [id number] DOB: 1946-04-20 Today's Date: 11/13/2017 Time: 8250-5397 SLP Time Calculation (min) (ACUTE ONLY): 40 min  Assessment / Plan / Recommendation Clinical Impression  Pt seen for toleration of diet and trials to upgrade diet consistency. Pt has been tolerating the dysphagia diet well and is much improved since admission(CCU). Pt easily distracted by could be redirected to task w/ verbal cues. Pt asked if she "had Dementia". Encouraged pt to talk w/ her family and MD about this. Pt is able to follow instruction w/ intermittent cues for complete follow through and attention to task. Pt is verbally conversive w/ 2-3 occurrences of perseveration.  Pt consumed po trials of mech soft foods then alternated w/ thin liquids w/ no overt s/s of aspiration noted; no decline in vocal quality or respiratory status. Pt helped to feed self given min setup support. Oral phase of swallowing appeared [facility] for bolus management and clearing despite missing few teeth - pt stated she ate "soft foods" at home prior.  As pt appears at her baseline w/ swallowing and oral phase bolus management of soft foods, recommend a mech soft diet (d/t missing teeth) and thin liquids; general aspiration precautions. Pills WHOLE in Puree for easier swallowing is recommended d/t pt's Cognitive status. Recommend tray setup and reduced distractions during meals d/t pt's Cognitive status. Of note, pt can be seen for f/u Cognitive therapy if MD recommends. ST services can be available for further education if needed while admitted. NSG and CM updated.     HPI HPI: Pt is a 63 F with hx of breast cancer, DM 2, suicidal ideation, depression/anxiety, stroke, HTN adm via EMS/ED with 2-3 days of N/V, new onset of seizure (out of hospital) and subsequent combative delirium. Required intubation due to extreme combativeness for 4 days. LP could not be  successfully performed. Initially treated as possible meningitis vs encephalitis. MRI brain: No acute intracranial process on this mildly motion degraded examination. Neurology initially treating for seizure; EEG done yesterday without any signs of seizures. MD will D/c Keppra in next 3 days as likely infection is the cause of seizures. HSV still pending would just finish up 7 day course of acyclovir. Pt is currently awake/verbally conversive but continuing to state she is "tired". Speech clear. Follows commands w/ gentle cues. Cues required to reattend to tasks intermittently.       SLP Plan  Continue with current plan of care       Recommendations  Diet recommendations: Dysphagia 3 (mechanical soft);Thin liquid Liquids provided via: Cup;Straw(monitor any straw use) Medication Administration: Whole meds with puree Supervision: Patient able to self feed;Intermittent supervision to cue for compensatory strategies(setup assist) Compensations: Minimize environmental distractions;Slow rate;Small sips/bites;Lingual sweep for clearance of pocketing;Multiple dry swallows after each bite/sip;Follow solids with liquid Postural Changes and/or Swallow Maneuvers: Seated upright 90 degrees;Upright 30-60 min after meal                General recommendations: (Dietician f/u if needed) Oral Care Recommendations: Oral care BID;Patient independent with oral care;Staff/trained caregiver to provide oral care Follow up Recommendations: Skilled Nursing facility SLP Visit Diagnosis: Dysphagia, unspecified (R13.10) Plan: Continue with current plan of care       [place], [place], CCC-SLP Candy Ziegler 11/13/2017, 10:55 AM

## 2017-11-14 LAB — VIRUS CULTURE

## 2018-03-12 ENCOUNTER — Inpatient Hospital Stay
Admission: EM | Admit: 2018-03-12 | Discharge: 2018-03-24 | DRG: 246 | Disposition: E | Payer: Medicare Other | Attending: Cardiovascular Disease | Admitting: Cardiovascular Disease

## 2018-03-12 ENCOUNTER — Encounter: Payer: Self-pay | Admitting: Emergency Medicine

## 2018-03-12 ENCOUNTER — Encounter: Admission: EM | Disposition: E | Payer: Self-pay | Source: Home / Self Care | Attending: Cardiovascular Disease

## 2018-03-12 ENCOUNTER — Emergency Department: Payer: Medicare Other

## 2018-03-12 ENCOUNTER — Other Ambulatory Visit: Payer: Self-pay

## 2018-03-12 DIAGNOSIS — Z791 Long term (current) use of non-steroidal anti-inflammatories (NSAID): Secondary | ICD-10-CM | POA: Diagnosis not present

## 2018-03-12 DIAGNOSIS — Z8249 Family history of ischemic heart disease and other diseases of the circulatory system: Secondary | ICD-10-CM | POA: Diagnosis not present

## 2018-03-12 DIAGNOSIS — Z800 Family history of malignant neoplasm of digestive organs: Secondary | ICD-10-CM

## 2018-03-12 DIAGNOSIS — I2102 ST elevation (STEMI) myocardial infarction involving left anterior descending coronary artery: Secondary | ICD-10-CM | POA: Diagnosis present

## 2018-03-12 DIAGNOSIS — N183 Chronic kidney disease, stage 3 (moderate): Secondary | ICD-10-CM | POA: Diagnosis present

## 2018-03-12 DIAGNOSIS — I236 Thrombosis of atrium, auricular appendage, and ventricle as current complications following acute myocardial infarction: Secondary | ICD-10-CM | POA: Diagnosis present

## 2018-03-12 DIAGNOSIS — I2510 Atherosclerotic heart disease of native coronary artery without angina pectoris: Secondary | ICD-10-CM

## 2018-03-12 DIAGNOSIS — Z794 Long term (current) use of insulin: Secondary | ICD-10-CM

## 2018-03-12 DIAGNOSIS — I255 Ischemic cardiomyopathy: Secondary | ICD-10-CM | POA: Diagnosis present

## 2018-03-12 DIAGNOSIS — I513 Intracardiac thrombosis, not elsewhere classified: Secondary | ICD-10-CM | POA: Diagnosis present

## 2018-03-12 DIAGNOSIS — Z8673 Personal history of transient ischemic attack (TIA), and cerebral infarction without residual deficits: Secondary | ICD-10-CM | POA: Diagnosis not present

## 2018-03-12 DIAGNOSIS — I130 Hypertensive heart and chronic kidney disease with heart failure and stage 1 through stage 4 chronic kidney disease, or unspecified chronic kidney disease: Secondary | ICD-10-CM | POA: Diagnosis present

## 2018-03-12 DIAGNOSIS — I4901 Ventricular fibrillation: Secondary | ICD-10-CM | POA: Diagnosis not present

## 2018-03-12 DIAGNOSIS — R079 Chest pain, unspecified: Secondary | ICD-10-CM

## 2018-03-12 DIAGNOSIS — E1122 Type 2 diabetes mellitus with diabetic chronic kidney disease: Secondary | ICD-10-CM | POA: Diagnosis present

## 2018-03-12 DIAGNOSIS — Z803 Family history of malignant neoplasm of breast: Secondary | ICD-10-CM

## 2018-03-12 DIAGNOSIS — G4700 Insomnia, unspecified: Secondary | ICD-10-CM | POA: Diagnosis present

## 2018-03-12 DIAGNOSIS — I2109 ST elevation (STEMI) myocardial infarction involving other coronary artery of anterior wall: Secondary | ICD-10-CM | POA: Diagnosis present

## 2018-03-12 DIAGNOSIS — I10 Essential (primary) hypertension: Secondary | ICD-10-CM | POA: Diagnosis present

## 2018-03-12 DIAGNOSIS — I213 ST elevation (STEMI) myocardial infarction of unspecified site: Secondary | ICD-10-CM

## 2018-03-12 DIAGNOSIS — Z9012 Acquired absence of left breast and nipple: Secondary | ICD-10-CM

## 2018-03-12 DIAGNOSIS — F419 Anxiety disorder, unspecified: Secondary | ICD-10-CM | POA: Diagnosis present

## 2018-03-12 DIAGNOSIS — F411 Generalized anxiety disorder: Secondary | ICD-10-CM | POA: Diagnosis present

## 2018-03-12 DIAGNOSIS — Z880 Allergy status to penicillin: Secondary | ICD-10-CM | POA: Diagnosis not present

## 2018-03-12 DIAGNOSIS — R531 Weakness: Secondary | ICD-10-CM | POA: Diagnosis present

## 2018-03-12 DIAGNOSIS — E119 Type 2 diabetes mellitus without complications: Secondary | ICD-10-CM

## 2018-03-12 DIAGNOSIS — I5021 Acute systolic (congestive) heart failure: Secondary | ICD-10-CM | POA: Diagnosis present

## 2018-03-12 DIAGNOSIS — E785 Hyperlipidemia, unspecified: Secondary | ICD-10-CM | POA: Diagnosis present

## 2018-03-12 DIAGNOSIS — Z885 Allergy status to narcotic agent status: Secondary | ICD-10-CM

## 2018-03-12 DIAGNOSIS — M1990 Unspecified osteoarthritis, unspecified site: Secondary | ICD-10-CM | POA: Diagnosis present

## 2018-03-12 HISTORY — PX: LEFT HEART CATH AND CORONARY ANGIOGRAPHY: CATH118249

## 2018-03-12 HISTORY — PX: CORONARY/GRAFT ACUTE MI REVASCULARIZATION: CATH118305

## 2018-03-12 LAB — COMPREHENSIVE METABOLIC PANEL
ALBUMIN: 3.4 g/dL — AB (ref 3.5–5.0)
ALT: 18 U/L (ref 14–54)
AST: 27 U/L (ref 15–41)
Alkaline Phosphatase: 79 U/L (ref 38–126)
Anion gap: 8 (ref 5–15)
BUN: 13 mg/dL (ref 6–20)
CHLORIDE: 100 mmol/L — AB (ref 101–111)
CO2: 27 mmol/L (ref 22–32)
Calcium: 9.8 mg/dL (ref 8.9–10.3)
Creatinine, Ser: 1.04 mg/dL — ABNORMAL HIGH (ref 0.44–1.00)
GFR calc Af Amer: >60 mL/min (ref >60)
GFR calc non Af Amer: 52 mL/min — ABNORMAL LOW (ref >60)
GLUCOSE: 183 mg/dL — AB (ref 65–99)
POTASSIUM: 4.1 mmol/L (ref 3.5–5.1)
Sodium: 135 mmol/L (ref 135–145)
Total Bilirubin: 0.9 mg/dL (ref 0.3–1.2)
Total Protein: 7.7 g/dL (ref 6.5–8.1)

## 2018-03-12 LAB — GLUCOSE, CAPILLARY
Glucose-Capillary: 170 mg/dL — ABNORMAL HIGH (ref 65–99)
Glucose-Capillary: 166 mg/dL — ABNORMAL HIGH (ref 65–99)
Glucose-Capillary: 173 mg/dL — ABNORMAL HIGH (ref 65–99)

## 2018-03-12 LAB — CBC
HCT: 36.8 % (ref 35.0–47.0)
Hemoglobin: 12.6 g/dL (ref 12.0–16.0)
MCH: 30.2 pg (ref 26.0–34.0)
MCHC: 34.3 g/dL (ref 32.0–36.0)
MCV: 88 fL (ref 80.0–100.0)
PLATELETS: 315 10*3/uL (ref 150–440)
RBC: 4.18 MIL/uL (ref 3.80–5.20)
RDW: 13.7 % (ref 11.5–14.5)
WBC: 12.3 10*3/uL — AB (ref 3.6–11.0)

## 2018-03-12 LAB — TROPONIN I: Troponin I: 1.25 ng/mL (ref <0.03)

## 2018-03-12 LAB — LIPASE, BLOOD: Lipase: 16 U/L (ref 11–51)

## 2018-03-12 LAB — CARDIAC CATHETERIZATION: CATHEFQUANT: 15 %

## 2018-03-12 LAB — POCT ACTIVATED CLOTTING TIME: ACTIVATED CLOTTING TIME: 257 s

## 2018-03-12 SURGERY — CORONARY/GRAFT ACUTE MI REVASCULARIZATION
Anesthesia: Moderate Sedation

## 2018-03-12 MED ORDER — INSULIN ASPART 100 UNIT/ML ~~LOC~~ SOLN: 0.0000 [IU] | Freq: Every day | SUBCUTANEOUS | Status: DC

## 2018-03-12 MED ORDER — SODIUM CHLORIDE 0.9 % IV SOLN: 250.0000 mL | INTRAVENOUS | Status: DC | PRN

## 2018-03-12 MED ORDER — HEPARIN (PORCINE) IN NACL 100-0.45 UNIT/ML-% IJ SOLN: 1150.0000 [IU]/h | INTRAMUSCULAR | Status: DC

## 2018-03-12 MED ORDER — HEPARIN SODIUM (PORCINE) 1000 UNIT/ML IJ SOLN
INTRAMUSCULAR | Status: AC
  Filled 2018-03-12: qty 1

## 2018-03-12 MED ORDER — SODIUM CHLORIDE 0.9% FLUSH: 3.0000 mL | INTRAVENOUS | Status: DC | PRN

## 2018-03-12 MED ORDER — LIDOCAINE HCL (PF) 1 % IJ SOLN
INTRAMUSCULAR | Status: AC
  Filled 2018-03-12: qty 30

## 2018-03-12 MED ORDER — TICAGRELOR 90 MG PO TABS: 90.0000 mg | ORAL_TABLET | Freq: Two times a day (BID) | ORAL | Status: DC

## 2018-03-12 MED ORDER — ONDANSETRON HCL 4 MG/2ML IJ SOLN
4.0000 mg | Freq: Four times a day (QID) | INTRAMUSCULAR | Status: DC | PRN
Start: 2018-03-12 — End: 2018-03-13

## 2018-03-12 MED ORDER — TICAGRELOR 90 MG PO TABS
ORAL_TABLET | ORAL | Status: AC
  Filled 2018-03-12: qty 2

## 2018-03-12 MED ORDER — ALPRAZOLAM 0.5 MG PO TABS: 0.5000 mg | ORAL_TABLET | Freq: Four times a day (QID) | ORAL | Status: DC | PRN

## 2018-03-12 MED ORDER — HEPARIN SODIUM (PORCINE) 5000 UNIT/ML IJ SOLN
4000.0000 [IU] | Freq: Once | INTRAMUSCULAR | Status: AC
  Administered 2018-03-12: 4000 [IU] via INTRAVENOUS

## 2018-03-12 MED ORDER — ASPIRIN 81 MG PO CHEW: 81.0000 mg | CHEWABLE_TABLET | Freq: Every day | ORAL | Status: DC

## 2018-03-12 MED ORDER — NITROGLYCERIN 5 MG/ML IV SOLN
INTRAVENOUS | Status: AC
  Filled 2018-03-12: qty 10

## 2018-03-12 MED ORDER — CARVEDILOL 3.125 MG PO TABS: 3.1250 mg | ORAL_TABLET | Freq: Two times a day (BID) | ORAL | Status: DC

## 2018-03-12 MED ORDER — SODIUM CHLORIDE 0.9% FLUSH: 3.0000 mL | Freq: Two times a day (BID) | INTRAVENOUS | Status: DC

## 2018-03-12 MED ORDER — FUROSEMIDE 10 MG/ML IJ SOLN
INTRAMUSCULAR | Status: DC | PRN
  Administered 2018-03-12: 40 mg via INTRAVENOUS

## 2018-03-12 MED ORDER — LETROZOLE 2.5 MG PO TABS
2.5000 mg | ORAL_TABLET | ORAL | Status: DC
  Filled 2018-03-12: qty 1

## 2018-03-12 MED ORDER — MIDAZOLAM HCL 2 MG/2ML IJ SOLN
INTRAMUSCULAR | Status: AC
  Filled 2018-03-12: qty 2

## 2018-03-12 MED ORDER — HEPARIN SODIUM (PORCINE) 1000 UNIT/ML IJ SOLN
INTRAMUSCULAR | Status: DC | PRN
  Administered 2018-03-12: 2000 [IU] via INTRAVENOUS
  Administered 2018-03-12: 4000 [IU] via INTRAVENOUS

## 2018-03-12 MED ORDER — ACETAMINOPHEN 325 MG PO TABS: 650.0000 mg | ORAL_TABLET | ORAL | Status: DC | PRN

## 2018-03-12 MED ORDER — IOPAMIDOL (ISOVUE-300) INJECTION 61%
INTRAVENOUS | Status: DC | PRN
  Administered 2018-03-12: 230 mL via INTRA_ARTERIAL

## 2018-03-12 MED ORDER — FUROSEMIDE 10 MG/ML IJ SOLN
INTRAMUSCULAR | Status: AC
  Filled 2018-03-12: qty 4

## 2018-03-12 MED ORDER — MIDAZOLAM HCL 2 MG/2ML IJ SOLN
INTRAMUSCULAR | Status: DC | PRN
  Administered 2018-03-12: 1 mg via INTRAVENOUS

## 2018-03-12 MED ORDER — TICAGRELOR 90 MG PO TABS
ORAL_TABLET | ORAL | Status: DC | PRN
  Administered 2018-03-12: 180 mg via ORAL

## 2018-03-12 MED ORDER — ENSURE ENLIVE PO LIQD: 237.0000 mL | Freq: Three times a day (TID) | ORAL | Status: DC

## 2018-03-12 MED ORDER — NITROGLYCERIN 1 MG/10 ML FOR IR/CATH LAB
INTRA_ARTERIAL | Status: DC | PRN
  Administered 2018-03-12 (×2): 200 ug via INTRACORONARY

## 2018-03-12 MED ORDER — QUETIAPINE FUMARATE 200 MG PO TABS
400.0000 mg | ORAL_TABLET | Freq: Every day | ORAL | Status: DC
  Filled 2018-03-12 (×2): qty 1

## 2018-03-12 MED ORDER — VERAPAMIL HCL 2.5 MG/ML IV SOLN
INTRAVENOUS | Status: AC
  Filled 2018-03-12: qty 2

## 2018-03-12 MED ORDER — ATORVASTATIN CALCIUM 80 MG PO TABS: 80.0000 mg | ORAL_TABLET | Freq: Every day | ORAL | Status: DC

## 2018-03-12 MED ORDER — ASPIRIN 81 MG PO CHEW
324.0000 mg | CHEWABLE_TABLET | Freq: Once | ORAL | Status: AC
  Administered 2018-03-12: 324 mg via ORAL

## 2018-03-12 MED ORDER — FUROSEMIDE 10 MG/ML IJ SOLN: 40.0000 mg | Freq: Two times a day (BID) | INTRAMUSCULAR | Status: DC

## 2018-03-12 MED ORDER — INSULIN ASPART 100 UNIT/ML ~~LOC~~ SOLN: 0.0000 [IU] | Freq: Three times a day (TID) | SUBCUTANEOUS | Status: DC

## 2018-03-12 SURGICAL SUPPLY — 17 items
BALLN MINITREK RX 2.0X12 (BALLOONS) ×3
BALLN TREK RX 2.5X12 (BALLOONS) ×3
BALLOON MINITREK RX 2.0X12 (BALLOONS) ×1 IMPLANT
BALLOON TREK RX 2.5X12 (BALLOONS) ×1 IMPLANT
CATH INFINITI 5FR ANG PIGTAIL (CATHETERS) ×3 IMPLANT
CATH LAUNCHER 6FR JL3.5 (CATHETERS) ×3 IMPLANT
CATH OPTITORQUE JACKY 4.0 5F (CATHETERS) ×3 IMPLANT
DEVICE INFLAT 30 PLUS (MISCELLANEOUS) ×3 IMPLANT
DEVICE RAD COMP TR BAND LRG (VASCULAR PRODUCTS) ×3 IMPLANT
KIT MANI 3VAL PERCEP (MISCELLANEOUS) ×3 IMPLANT
NEEDLE PERC 21GX4CM (NEEDLE) ×3 IMPLANT
PACK CARDIAC CATH (CUSTOM PROCEDURE TRAY) ×3 IMPLANT
SHEATH RAIN RADIAL 21G 6FR (SHEATH) ×3 IMPLANT
STENT RESOLUTE ONYX 2.0X15 (Permanent Stent) ×3 IMPLANT
STENT SIERRA 2.75 X 15 MM (Permanent Stent) ×3 IMPLANT
WIRE INTUITION PROPEL ST 180CM (WIRE) ×3 IMPLANT
WIRE ROSEN-J .035X260CM (WIRE) ×3 IMPLANT

## 2018-03-13 ENCOUNTER — Encounter: Payer: Self-pay | Admitting: Cardiovascular Disease

## 2018-03-13 LAB — TROPONIN I: TROPONIN I: 1.82 ng/mL — AB (ref <0.03)

## 2018-03-13 LAB — MRSA PCR SCREENING: MRSA by PCR: NEGATIVE

## 2018-03-13 MED FILL — Medication: Qty: 1 | Status: AC

## 2018-03-13 NOTE — Progress Notes (Signed)
Chaplain responded to a Code Blue to the patient's room and arrived while the care team were performing CPR. Chaplain went to the waiting room and spoke with the patient's husband and provided emotional support. Chaplain returned to the room and offered silent prayer and a ministry of presence. After the patient's death, Chaplain and [facility] [place] met with the husband and discussed next steps. Afterwards, the Chaplain walked the husband to the parking lot and offered a departing blessing.

## 2018-03-20 ENCOUNTER — Telehealth: Payer: Self-pay

## 2018-03-20 NOTE — Telephone Encounter (Signed)
[place] [facility] is ready for pick up. Nothing further needed.

## 2018-03-20 NOTE — Telephone Encounter (Signed)
[employer] dropped off Death Certificate to be completed and signed °Placed in nurse box °

## 2018-03-20 NOTE — Telephone Encounter (Signed)
Death certificate placed in MD folder for completion.

## 2018-03-24 NOTE — Consult Note (Signed)
Name: Yvette Tyler MRN: [id number] DOB: 1945-12-24    ADMISSION DATE:  March 29, 2018 CONSULTATION DATE: 03-29-2018  REFERRING MD : Dr. Fletcher Anon   CHIEF COMPLAINT: Generalized Weakness  BRIEF PATIENT DESCRIPTION:  72 yo female admitted with STEMI s/p cardiac catheterization revealed occlusions of the proximal LAD requiring 2 drug eluting stents   SIGNIFICANT EVENTS/STUDIES:  05/20 Pt admitted to ICU s/p cardiac catheterization   HISTORY OF PRESENT ILLNESS:   This is a 72 yo female with a PMH as listed below who presented to [facility] ER on 05/20 with generalized weakness and nausea onset of symptoms 2-3 weeks ago.  She also endorsed intermittent vomiting and diarrhea.  Per ER notes the pt has been unable to eat with difficulty walking prompting current ER visit. In the ER EKG concerning for possible STEMI, therefore code STEMI initiated.  CXR concerning for pulmonary edema and bilateral pleural effusion.  Cardiologist Dr. Fletcher Anon notified initial troponin 1.25, pt emergently transported to cath lab for possible intervention.  Cardiac cath revealed occlusions of the proximal LAD requiring 2 drug eluting stents.  She was subsequently admitted to ICU for further workup and treatment.    PAST MEDICAL HISTORY :   has a past medical history of Anxiety, Cancer ([place]), Depression, Diabetes mellitus without complication ([place]), DVT (deep vein thrombosis) in pregnancy ([place]), Hyperlipidemia, Hypertension, Insomnia, Osteoarthritis, Rectal bleeding, Stroke ([place]), and Suicidal ideation.  has a past surgical history that includes Breast biopsy (Left, 10/27/2015); Vein bypass surgery; Colonoscopy; Partial mastectomy with axillary sentinel lymph node biopsy (Left, 12/08/2015); Axillary lymph node dissection (Left, 12/08/2015); and Mastectomy (Left, 12/08/2015). Prior to Admission medications   Medication Sig Start Date End Date Taking? Authorizing Provider  acetaminophen (TYLENOL) 325 MG tablet Take 2 tablets (650 mg  total) by mouth every 4 (four) hours as needed for mild pain (temp > 101.5). 11/13/17  Yes Wieting, Richard, MD  albuterol (VENTOLIN HFA) 108 (90 Base) MCG/ACT inhaler Inhale 2 puffs into the lungs every 6 (six) hours as needed. 01/05/18  Yes [provider]  ALPRAZolam Duanne Moron) 0.5 MG tablet Take 1 tablet (0.5 mg total) by mouth every 6 (six) hours as needed for anxiety. 11/13/17  Yes Wieting, Richard, MD  amLODipine (NORVASC) 5 MG tablet Take 1 tablet (5 mg total) by mouth daily. 11/14/17  Yes Wieting, Richard, MD  etodolac (LODINE) 300 MG capsule Take 1 capsule (300 mg total) by mouth 2 (two) times daily. Patient taking differently: Take 300 mg by mouth daily as needed for mild pain.  11/13/17  Yes Wieting, Richard, MD  feeding supplement, ENSURE ENLIVE, (ENSURE ENLIVE) LIQD Take 237 mLs by mouth 3 (three) times daily between meals. 11/13/17  Yes Wieting, Richard, MD  insulin aspart (NOVOLOG) 100 UNIT/ML injection Inject 3 Units into the skin 3 (three) times daily with meals. 11/13/17  Yes Wieting, Richard, MD  insulin glargine (LANTUS) 100 UNIT/ML injection Inject 0.12 mLs (12 Units total) into the skin daily. Patient taking differently: Inject 15 Units into the skin daily.  11/14/17  Yes Wieting, Richard, MD  letrozole [facility]) 2.5 MG tablet Take 2.5 mg by mouth every morning.   Yes [provider]  metoprolol tartrate (LOPRESSOR) 25 MG tablet Take 1 tablet (25 mg total) by mouth 2 (two) times daily. 11/13/17  Yes Wieting, Richard, MD  nystatin (MYCOSTATIN/NYSTOP) powder Apply topically 2 (two) times daily. 11/13/17  Yes Wieting, Richard, MD  PARoxetine (PAXIL) 40 MG tablet Take 1 tablet by mouth daily.   Yes [provider]  QUEtiapine (SEROQUEL) 400 MG tablet Take 1 tablet by mouth at bedtime. 02/22/18  Yes [provider]  rOPINIRole (REQUIP) 0.5 MG tablet Take 1 tablet by mouth at bedtime. 02/16/18  Yes [provider]  simvastatin (ZOCOR) 40 MG tablet Take 40  mg by mouth daily.   Yes [provider]   Allergies  Allergen Reactions  . Hydrocodone-Acetaminophen Other (See Comments)    Increased dream activity  . Meperidine Other (See Comments)    Rapid heart rate  . Penicillins Rash    Has patient had a PCN reaction causing immediate rash, facial/tongue/throat swelling, SOB or lightheadedness with hypotension: Yes Has patient had a PCN reaction causing severe rash involving mucus membranes or skin necrosis: No Has patient had a PCN reaction that required hospitalization: No Has patient had a PCN reaction occurring within the last 10 years: No If all of the above answers are "NO", then may proceed with Cephalosporin use.     FAMILY HISTORY:  family history includes Breast cancer in her mother; Colon cancer in her paternal uncle; Heart failure in her mother; Pancreatic cancer in her unknown relative; Pancreatic cancer (age of onset: 56) in her father. SOCIAL HISTORY:  reports that she has never smoked. She has never used smokeless tobacco. She reports that she does not drink alcohol or use drugs.  REVIEW OF SYSTEMS: Positives in BOLD  Constitutional: Negative for fever, chills, weight loss, malaise/fatigue and diaphoresis.  HENT: Negative for hearing loss, ear pain, nosebleeds, congestion, sore throat, neck pain, tinnitus and ear discharge.   Eyes: Negative for blurred vision, double vision, photophobia, pain, discharge and redness.  Respiratory: Negative for cough, hemoptysis, sputum production, shortness of breath, wheezing and stridor.   Cardiovascular: Negative for chest pain, palpitations, orthopnea, claudication, leg swelling and PND.  Gastrointestinal: heartburn, nausea, vomiting, abdominal pain, diarrhea, constipation, blood in stool and melena.  Genitourinary: Negative for dysuria, urgency, frequency, hematuria and flank pain.  Musculoskeletal: Negative for myalgias, back pain, joint pain and falls.  Skin: Negative for itching  and rash.  Neurological: Negative for dizziness, tingling, tremors, sensory change, speech change, focal weakness, seizures, loss of consciousness, weakness and headaches.  Endo/Heme/Allergies: Negative for environmental allergies and polydipsia. Does not bruise/bleed easily.  SUBJECTIVE:  Pt states she feels better no current complaints at this time   VITAL SIGNS: Temp:  [99 F (37.2 C)] 99 F (37.2 C) (05/20 2026) Pulse Rate:  [117-122] 122 (05/20 2100) Resp:  [20-34] 34 (05/20 2100) BP: (131-133)/(64-85) 132/85 (05/20 2100) SpO2:  [92 %-97 %] 92 % (05/20 2129) Weight:  [78.5 kg (173 lb)] 78.5 kg (173 lb) (05/20 2026)  PHYSICAL EXAMINATION: General: well developed, well nourished female NAD  Neuro: alert and oriented, follows commands  HEENT: supple, no JVD Cardiovascular: sinus tach, no R/G  Lungs: clear throughout, even, non labored  Abdomen: +BS x4, soft, non tender, non distended  Musculoskeletal: normal bulk and tone, no edema  Skin: right radial TR band in place no hematoma or bleeding   Recent Labs  Lab 2018/04/08 2028  NA 135  K 4.1  CL 100*  CO2 27  BUN 13  CREATININE 1.04*  GLUCOSE 183*   Recent Labs  Lab 2018/04/08 2028  HGB 12.6  HCT 36.8  WBC 12.3*  PLT 315   Dg Chest Port 1 View  Result Date: 2018/04/08 CLINICAL DATA:  Weakness.  Nausea.  Vomiting and diarrhea. EXAM: PORTABLE CHEST 1 VIEW COMPARISON:  Radiograph 11/07/2017 FINDINGS: Unchanged cardiomegaly.  Mediastinal contours are unchanged. Bilateral pleural effusions, moderate on the right and mild-to-moderate on the left, similar to prior exam allowing for differences in technique. Increased pulmonary edema. No pneumothorax. IMPRESSION: Pulmonary edema with cardiomegaly and bilateral pleural effusions, suggesting CHF. Electronically Signed   By: Jeb Levering M.D.   On: 04-10-2018 21:04    ASSESSMENT / PLAN: STEMI s/p cardiac catheterization  Nausea/Vomiting  Diarrhea Hx: Stroke, DVT, Diabetes  Mellitus, Suicidal Ideation, and Stage III CKD   P: Supplemental O2 for dyspnea and/or hypoxia  Continuous telemetry monitoring  Trend troponin's Echo pending  Cardiology consulted appreciate input  Continue cardiac medications per cardiology recommendations  Trend BMP  Replace electrolytes as indicated  Monitor UOP Trend CBC Monitor for s/sx of bleeding and transfuse for hgb <8  Marda Stalker, [place] Pager [phone] (please enter 7 digits) [place] Pager [phone] (please enter 7 digits)

## 2018-03-24 NOTE — Progress Notes (Signed)
ANTICOAGULATION CONSULT NOTE - Initial Consult  Pharmacy Consult for heparin Indication: chest pain/ACS  Allergies  Allergen Reactions  . Hydrocodone-Acetaminophen Other (See Comments)    Increased dream activity  . Meperidine Other (See Comments)    Rapid heart rate  . Penicillins Rash    Has patient had a PCN reaction causing immediate rash, facial/tongue/throat swelling, SOB or lightheadedness with hypotension: Yes Has patient had a PCN reaction causing severe rash involving mucus membranes or skin necrosis: No Has patient had a PCN reaction that required hospitalization: No Has patient had a PCN reaction occurring within the last 10 years: No If all of the above answers are "NO", then may proceed with Cephalosporin use.     Patient Measurements: Height: 5\' 7"  (170.2 cm) Weight: 178 lb 12 oz (81.1 kg) IBW/kg (Calculated) : 61.6  Vital Signs: Temp: 98.3 F (36.8 C) (05/20 2250) Temp Source: Oral (05/20 2250) BP: 165/81 (05/20 2300) Pulse Rate: 118 (05/20 2300)  Labs: Recent Labs    04/07/2018 2028  HGB 12.6  HCT 36.8  PLT 315  CREATININE 1.04*  TROPONINI 1.25*    Estimated Creatinine Clearance: 52.8 mL/min (A) (by C-G formula based on SCr of 1.04 mg/dL (H)).   Medical History: Past Medical History:  Diagnosis Date  . Anxiety   . Cancer ([place])   . Depression   . Diabetes mellitus without complication ([place])   . DVT (deep vein thrombosis) in pregnancy ([place])   . Hyperlipidemia   . Hypertension   . Insomnia   . Osteoarthritis   . Rectal bleeding   . Stroke ([place])   . Suicidal ideation     Medications:  Scheduled:  . [START ON 03/13/2018] aspirin  81 mg Oral Daily  . [START ON 03/13/2018] atorvastatin  80 mg Oral q1800  . [START ON 03/13/2018] carvedilol  3.125 mg Oral BID WC  . [START ON 03/13/2018] feeding supplement (ENSURE ENLIVE)  237 mL Oral TID BM  . furosemide  40 mg Intravenous Q12H  . insulin aspart  0-5 Units Subcutaneous QHS  . [START ON  03/13/2018] insulin aspart  0-9 Units Subcutaneous TID WC  . [START ON 03/13/2018] letrozole  2.5 mg Oral BH-q7a  . QUEtiapine  400 mg Oral QHS  . sodium chloride flush  3 mL Intravenous Q12H  . [START ON 03/13/2018] ticagrelor  90 mg Oral BID    Assessment: Patient admitted for weakness found to have trops of 1.25 and EKG showing ST elevation. Patient taken to cath and transferred to ICU. Pharmacy consulted for heparin drip 8 hours post-sheath removal. Patient received a total of 10,000 units of IV heparin prior and during cath  Goal of Therapy:  Heparin level 0.3-0.7 units/ml Monitor platelets by anticoagulation protocol: Yes   Plan:  Will not bolus considering patient has already received 10,000 units of heparin Will start patient on heparin 1150 units/hr 5/21 @ 0700 8 hours post sheath removal Baseline labs have been ordered Will check anti-Xa @ 1500 05/21 Will monitor daily cbc and adjust per anti-Xa levels.  Tobie Lords, PharmD, BCPS Clinical Pharmacist Apr 07, 2018

## 2018-03-24 NOTE — Significant Event (Cosign Needed)
Code Blue called at 2310 initial cardiac rhythm PEA, ACLS protocol initiated.  ER physician Dr. Dahlia Client mechanically intubated pt. Pt remained pulseless with intermittent ventricular fibrillation requiring multiple defibrillation.  Despite ACLS unable to achieve ROSC pts husband at bedside. Dr. Fletcher Anon arrived at pts bedside and spoke with pts husband pt expired at 2332.  Marda Stalker, [place] Pager [phone] (please enter 7 digits) PCCM Consult Pager [phone] (please enter 7 digits)

## 2018-03-24 NOTE — Death Summary Note (Signed)
DEATH SUMMARY   Patient Details  Name: Yvette Tyler MRN: [id number] DOB: August 23, 1946  Admission/Discharge Information   Admit Date:  04/02/2018  Date of Death:  April 02, 2018  Time of Death:  02/10/31  Length of Stay: 0  Referring Physician: Glendon Axe, MD   Reason(s) for Hospitalization  Cardiac Arrest   Diagnoses  Preliminary cause of death: Cardiac arrest [facility]) Secondary Diagnoses (including complications and co-morbidities):  Principal Problem:   Acute ST elevation myocardial infarction (STEMI) involving left anterior descending (LAD) coronary artery ([place]) Active Problems:   HTN (hypertension)   HLD (hyperlipidemia)   Diabetes ([place])   Generalized anxiety disorder   Acute systolic CHF (congestive heart failure) (HCC)   LV (left ventricular) mural thrombus following MI [facility])   Brief Hospital Course (including significant findings, care, treatment, and services provided and events leading to death)  Yvette Tyler is a 72 y.o. year old female who  presented to [facility] ER on 04/02/2018 with generalized weakness and nausea onset of symptoms 2-3 weeks prior to presentation.  She also endorsed intermittent vomiting and diarrhea. Per ER notes the pt was unable to eat with difficulty walking prompting ER visit. In the ER EKG concerning for possible STEMI, therefore code STEMI initiated.  CXR concerning for pulmonary edema and bilateral pleural effusions.  Cardiologist Dr. Fletcher Anon notified initial troponin 1.25, pt emergently transported to cath lab for possible intervention.  Cardiac Cath revealed occlusions of the proximal LAD requiring 2 drug eluting stents, significant wall motion abnormality with an EF of 15%, and a left ventricular mural thrombus.  She was subsequently admitted to ICU for further workup and treatment. On 02-Apr-2018 pt became unresponsive with initial cardiac rhythm PEA, Code Blue called at 8416 and ACLS protocol initiated.  Pt mechanically intubated by ER physician. Pt  remained pulseless with intermittent ventricular fibrillation requiring multiple defibrillation. Despite ACLS unable to achieve ROSC, pt expired at 2332 on Apr 02, 2018.   Pertinent Labs and Studies  Significant Diagnostic Studies Dg Chest Port 1 View  Result Date: 04/02/2018 CLINICAL DATA:  Weakness.  Nausea.  Vomiting and diarrhea. EXAM: PORTABLE CHEST 1 VIEW COMPARISON:  Radiograph 11/07/2017 FINDINGS: Unchanged cardiomegaly. Mediastinal contours are unchanged. Bilateral pleural effusions, moderate on the right and mild-to-moderate on the left, similar to prior exam allowing for differences in technique. Increased pulmonary edema. No pneumothorax. IMPRESSION: Pulmonary edema with cardiomegaly and bilateral pleural effusions, suggesting CHF. Electronically Signed   By: Jeb Levering M.D.   On: 04/02/2018 21:04    Microbiology No results found for this or any previous visit (from the past 240 hour(s)).  Lab Basic Metabolic Panel: Recent Labs  Lab 2018-04-02 2027-02-10  NA 135  K 4.1  CL 100*  CO2 27  GLUCOSE 183*  BUN 13  CREATININE 1.04*  CALCIUM 9.8   Liver Function Tests: Recent Labs  Lab 2018/04/02 2027/02/10  AST 27  ALT 18  ALKPHOS 79  BILITOT 0.9  PROT 7.7  ALBUMIN 3.4*   Recent Labs  Lab 04-02-2018 2027/02/10  LIPASE 16   No results for input(s): AMMONIA in the last 168 hours. CBC: Recent Labs  Lab 2018-04-02 2028  WBC 12.3*  HGB 12.6  HCT 36.8  MCV 88.0  PLT 315   Cardiac Enzymes: Recent Labs  Lab Apr 02, 2018 02/10/2027  TROPONINI 1.25*   Sepsis Labs: Recent Labs  Lab 2018/04/02 2027/02/10  WBC 12.3*    Procedures/Operations  Mechanical Intubation   Marda Stalker, [place] Pager [phone] (please enter 7  digits) PCCM Consult Pager [phone] (please enter 7 digits)

## 2018-03-24 NOTE — H&P (Signed)
[facility] NAME: Yvette Tyler    MR#:  [id number]  DATE OF BIRTH:  September 03, 1946  DATE OF ADMISSION:  March 24, 2018  PRIMARY CARE PHYSICIAN: Glendon Axe, MD   REQUESTING/REFERRING PHYSICIAN: Fletcher Anon, MD  CHIEF COMPLAINT:   Chief Complaint  Patient presents with  . Weakness  . Code STEMI    HISTORY OF PRESENT ILLNESS:  Yvette Tyler  is a 72 y.o. female who presents with STEMI.  Patient presented with chest pain and had anterior STEMI, was taken to Cath Lab and had 2 stents placed in the LAD.  She had significant wall motion abnormality with a EF of 15% and a left ventricular mural thrombus.  Admitted to the hospital, cardiologist called hospitalist for admission to ICU with intensivist support.    PAST MEDICAL HISTORY:   Past Medical History:  Diagnosis Date  . Anxiety   . Cancer ([place])   . Depression   . Diabetes mellitus without complication ([place])   . DVT (deep vein thrombosis) in pregnancy ([place])   . Hyperlipidemia   . Hypertension   . Insomnia   . Osteoarthritis   . Rectal bleeding   . Stroke ([place])   . Suicidal ideation      PAST SURGICAL HISTORY:   Past Surgical History:  Procedure Laterality Date  . AXILLARY LYMPH NODE DISSECTION Left 12/08/2015   Procedure: AXILLARY LYMPH NODE DISSECTION;  Surgeon: Leonie Green, MD;  Location: [facility] ORS;  Service: General;  Laterality: Left;  POSSIBLE AXILLARY NODE DISSECTION  . BREAST BIOPSY Left 10/27/2015   path pending  . COLONOSCOPY    . MASTECTOMY Left 12/08/2015  . PARTIAL MASTECTOMY WITH AXILLARY SENTINEL LYMPH NODE BIOPSY Left 12/08/2015   Procedure: LEFT MASTECTOMY WITH AXILLARY SENTINEL LYMPH NODE BIOPSY;  Surgeon: Leonie Green, MD;  Location: [facility] ORS;  Service: General;  Laterality: Left;  Marland Kitchen VEIN BYPASS SURGERY       SOCIAL HISTORY:   Social History   Tobacco Use  . Smoking status: Never Smoker  . Smokeless tobacco: Never Used   Substance Use Topics  . Alcohol use: No     FAMILY HISTORY:   Family History  Problem Relation Age of Onset  . Pancreatic cancer Father 37  . Colon cancer Paternal Uncle   . Pancreatic cancer Unknown   . Heart failure Mother   . Breast cancer Mother      DRUG ALLERGIES:   Allergies  Allergen Reactions  . Hydrocodone-Acetaminophen Other (See Comments)    Increased dream activity  . Meperidine Other (See Comments)    Rapid heart rate  . Penicillins Rash    Has patient had a PCN reaction causing immediate rash, facial/tongue/throat swelling, SOB or lightheadedness with hypotension: Yes Has patient had a PCN reaction causing severe rash involving mucus membranes or skin necrosis: No Has patient had a PCN reaction that required hospitalization: No Has patient had a PCN reaction occurring within the last 10 years: No If all of the above answers are "NO", then may proceed with Cephalosporin use.     MEDICATIONS AT HOME:   Prior to Admission medications   Medication Sig Start Date End Date Taking? Authorizing Provider  acetaminophen (TYLENOL) 325 MG tablet Take 2 tablets (650 mg total) by mouth every 4 (four) hours as needed for mild pain (temp > 101.5). 11/13/17  Yes Wieting, Richard, MD  albuterol (VENTOLIN HFA) 108 (90 Base) MCG/ACT inhaler Inhale 2 puffs into the  lungs every 6 (six) hours as needed. 01/05/18  Yes [provider]  ALPRAZolam Duanne Moron) 0.5 MG tablet Take 1 tablet (0.5 mg total) by mouth every 6 (six) hours as needed for anxiety. 11/13/17  Yes Wieting, Richard, MD  amLODipine (NORVASC) 5 MG tablet Take 1 tablet (5 mg total) by mouth daily. 11/14/17  Yes Wieting, Richard, MD  etodolac (LODINE) 300 MG capsule Take 1 capsule (300 mg total) by mouth 2 (two) times daily. Patient taking differently: Take 300 mg by mouth daily as needed for mild pain.  11/13/17  Yes Wieting, Richard, MD  feeding supplement, ENSURE ENLIVE, (ENSURE ENLIVE) LIQD Take 237 mLs by mouth 3  (three) times daily between meals. 11/13/17  Yes Wieting, Richard, MD  insulin aspart (NOVOLOG) 100 UNIT/ML injection Inject 3 Units into the skin 3 (three) times daily with meals. 11/13/17  Yes Wieting, Richard, MD  insulin glargine (LANTUS) 100 UNIT/ML injection Inject 0.12 mLs (12 Units total) into the skin daily. Patient taking differently: Inject 15 Units into the skin daily.  11/14/17  Yes Wieting, Richard, MD  letrozole [facility]) 2.5 MG tablet Take 2.5 mg by mouth every morning.   Yes [provider]  metoprolol tartrate (LOPRESSOR) 25 MG tablet Take 1 tablet (25 mg total) by mouth 2 (two) times daily. 11/13/17  Yes Wieting, Richard, MD  nystatin (MYCOSTATIN/NYSTOP) powder Apply topically 2 (two) times daily. 11/13/17  Yes Wieting, Richard, MD  PARoxetine (PAXIL) 40 MG tablet Take 1 tablet by mouth daily.   Yes [provider]  QUEtiapine (SEROQUEL) 400 MG tablet Take 1 tablet by mouth at bedtime. 02/22/18  Yes [provider]  rOPINIRole (REQUIP) 0.5 MG tablet Take 1 tablet by mouth at bedtime. 02/16/18  Yes [provider]  simvastatin (ZOCOR) 40 MG tablet Take 40 mg by mouth daily.   Yes [provider]    REVIEW OF SYSTEMS:  Review of Systems  Unable to perform ROS: Critical illness     VITAL SIGNS:   Vitals:   04-Apr-2018 2026 04/04/2018 2039 April 04, 2018 2100 2018-04-04 2129  BP: 131/64 133/66 132/85   Pulse: (!) 117 (!) 121 (!) 122   Resp: 20 (!) 31 (!) 34   Temp: 99 F (37.2 C)     TempSrc: Oral     SpO2: 97% 97% 96% 92%  Weight: 78.5 kg (173 lb)     Height: 5\' 7"  (1.702 m)      Wt Readings from Last 3 Encounters:  04-Apr-2018 78.5 kg (173 lb)  11/13/17 79.4 kg (175 lb 0.7 oz)  11/29/16 78 kg (172 lb)    PHYSICAL EXAMINATION:  Physical Exam  Vitals reviewed. Constitutional: She appears well-developed and well-nourished. No distress.  HENT:  Head: Normocephalic and atraumatic.  Mouth/Throat: Oropharynx is clear and moist.  Eyes: Pupils  are equal, round, and reactive to light. Conjunctivae and EOM are normal. No scleral icterus.  Neck: Normal range of motion. Neck supple. No JVD present. No thyromegaly present.  Cardiovascular: Regular rhythm and intact distal pulses. Exam reveals no gallop and no friction rub.  No murmur heard. Tachycardic  Respiratory: Effort normal and breath sounds normal. No respiratory distress. She has no wheezes. She has no rales.  GI: Soft. Bowel sounds are normal. She exhibits no distension. There is no tenderness.  Musculoskeletal: Normal range of motion. She exhibits no edema.  No arthritis, no gout  Lymphadenopathy:    She has no cervical adenopathy.  Neurological: No cranial nerve deficit.  Unable  to fully assess due to patient condition  Skin: Skin is warm and dry. No rash noted. No erythema.  Psychiatric:  Unable to fully assess due to patient condition    LABORATORY PANEL:   CBC Recent Labs  Lab 2018-03-29 2028  WBC 12.3*  HGB 12.6  HCT 36.8  PLT 315   ------------------------------------------------------------------------------------------------------------------  Chemistries  Recent Labs  Lab 03-29-2018 2028  NA 135  K 4.1  CL 100*  CO2 27  GLUCOSE 183*  BUN 13  CREATININE 1.04*  CALCIUM 9.8  AST 27  ALT 18  ALKPHOS 79  BILITOT 0.9   ------------------------------------------------------------------------------------------------------------------  Cardiac Enzymes Recent Labs  Lab 03/29/2018 2028  TROPONINI 1.25*   ------------------------------------------------------------------------------------------------------------------  RADIOLOGY:  Dg Chest Port 1 View  Result Date: 03/29/2018 CLINICAL DATA:  Weakness.  Nausea.  Vomiting and diarrhea. EXAM: PORTABLE CHEST 1 VIEW COMPARISON:  Radiograph 11/07/2017 FINDINGS: Unchanged cardiomegaly. Mediastinal contours are unchanged. Bilateral pleural effusions, moderate on the right and mild-to-moderate on the left,  similar to prior exam allowing for differences in technique. Increased pulmonary edema. No pneumothorax. IMPRESSION: Pulmonary edema with cardiomegaly and bilateral pleural effusions, suggesting CHF. Electronically Signed   By: Jeb Levering M.D.   On: 03-29-2018 21:04    EKG:   Orders placed or performed during the hospital encounter of 03-29-2018  . ED EKG  . ED EKG  . EKG 12-Lead  . EKG 12-Lead  . EKG 12-Lead immediately post procedure  . EKG 12-Lead  . EKG 12-Lead immediately post procedure    IMPRESSION AND PLAN:  Principal Problem:   Acute ST elevation myocardial infarction (STEMI) involving left anterior descending (LAD) coronary artery (HCC) -status post cath with 2 stents to LAD.  Post cath care ordered, cardiology following Active Problems:   Acute systolic CHF (congestive heart failure) (HCC) -15% after MI with significant anterior wall death and motion abnormality.  Patient is being diuresed tonight, cardiology following   LV (left ventricular) mural thrombus following MI [facility]) -patient will be started on heparin 8 hours post-cath   HTN (hypertension) -PRN antihypertensives   Diabetes ([place]) -sliding scale insulin with corresponding glucose checks   HLD (hyperlipidemia) -home dose statin  Chart review performed and case discussed with ED provider. Labs, imaging and/or ECG reviewed by provider and discussed with patient/family. Management plans discussed with the patient and/or family.  DVT PROPHYLAXIS: Systemic anticoagulation  GI PROPHYLAXIS: None  ADMISSION STATUS: Inpatient  CODE STATUS: Full    Code Status Orders  (From admission, onward)        Start     Ordered   Mar 29, 2018 2237  Full code  Continuous     03-29-2018 2236    Code Status History    Date Active Date Inactive Code Status Order ID Comments User Context   11/05/2017 0220 11/14/2017 0031 Full Code [id number]  Lance Coon, MD Inpatient   12/08/2015 1750 12/09/2015 1951 Full Code [id number]  Leonie Green, MD Inpatient    Full Code  TOTAL CRITICAL CARE TIME TAKING CARE OF THIS PATIENT: 50 minutes.   Wanninger, Shante [place] 03/29/2018, 10:41 PM  [facility]  Office  [phone]  CC: Primary care physician; Glendon Axe, MD  Note:  This document was prepared using Dragon voice recognition software and may include unintentional dictation errors.

## 2018-03-24 NOTE — Consult Note (Signed)
Cardiology Consultation:   Patient ID: Yvette Tyler; [id number]; [birth year]   Admit date: Mar 19, 2018 Date of Consult: 2018/03/19  Primary Care Provider: Glendon Axe, MD Primary Cardiologist: Thurston Hole)   Patient Profile:   Yvette Tyler is a 72 y.o. female with a hx of diabetes mellitus, hypertension hyperlipidemia who is being seen today for the evaluation of suspected ST elevation myocardial infarction at the request of Dr. Kerman Passey.  History of Present Illness:   Yvette Tyler is a 72 year old female with no previous cardiac history.  She has prolonged history of diabetes mellitus, essential hypertension, hyperlipidemia, anxiety, and previous stroke.  She has been feeling poorly for about 1 week with generalized weakness, nausea and extremely poor appetite.  Over the last few days she had shortness of breath with orthopnea and could not get comfortable.  She denies any chest pain or discomfort whatsoever.  She finally decided to call her primary care physician who prompted her to call 911.  The patient waited for her husband who drove her to the emergency room.  Upon presentation, she was noted to have subtle anterior ST elevation in V3 and V4 as well as inferior ST elevation.  Due to that, a code STEMI was activated.  I spoke with the patient in the ED and she looked very uncomfortable and tachycardic.  Her presentation was suggestive of late presentation given her symptoms.  However, due to continued symptoms, I recommended proceeding with emergent cardiac catheterization.  I discussed the procedure in details as well as risks and benefits.  Past Medical History:  Diagnosis Date  . Anxiety   . Cancer ([place])   . Depression   . Diabetes mellitus without complication ([place])   . DVT (deep vein thrombosis) in pregnancy ([place])   . Hyperlipidemia   . Hypertension   . Insomnia   . Osteoarthritis   . Rectal bleeding   . Stroke ([place])   . Suicidal ideation     Past Surgical  History:  Procedure Laterality Date  . AXILLARY LYMPH NODE DISSECTION Left 12/08/2015   Procedure: AXILLARY LYMPH NODE DISSECTION;  Surgeon: Leonie Green, MD;  Location: [facility] ORS;  Service: General;  Laterality: Left;  POSSIBLE AXILLARY NODE DISSECTION  . BREAST BIOPSY Left 10/27/2015   path pending  . COLONOSCOPY    . MASTECTOMY Left 12/08/2015  . PARTIAL MASTECTOMY WITH AXILLARY SENTINEL LYMPH NODE BIOPSY Left 12/08/2015   Procedure: LEFT MASTECTOMY WITH AXILLARY SENTINEL LYMPH NODE BIOPSY;  Surgeon: Leonie Green, MD;  Location: [facility] ORS;  Service: General;  Laterality: Left;  Marland Kitchen VEIN BYPASS SURGERY       Home Medications:  Prior to Admission medications   Medication Sig Start Date End Date Taking? Authorizing Provider  acetaminophen (TYLENOL) 325 MG tablet Take 2 tablets (650 mg total) by mouth every 4 (four) hours as needed for mild pain (temp > 101.5). 11/13/17  Yes Wieting, Richard, MD  albuterol (VENTOLIN HFA) 108 (90 Base) MCG/ACT inhaler Inhale 2 puffs into the lungs every 6 (six) hours as needed. 01/05/18  Yes [provider]  ALPRAZolam Duanne Moron) 0.5 MG tablet Take 1 tablet (0.5 mg total) by mouth every 6 (six) hours as needed for anxiety. 11/13/17  Yes Wieting, Richard, MD  amLODipine (NORVASC) 5 MG tablet Take 1 tablet (5 mg total) by mouth daily. 11/14/17  Yes Wieting, Richard, MD  etodolac (LODINE) 300 MG capsule Take 1 capsule (300 mg total) by mouth 2 (two) times daily. Patient taking differently: Take 300  mg by mouth daily as needed for mild pain.  11/13/17  Yes Wieting, Richard, MD  feeding supplement, ENSURE ENLIVE, (ENSURE ENLIVE) LIQD Take 237 mLs by mouth 3 (three) times daily between meals. 11/13/17  Yes Wieting, Richard, MD  insulin aspart (NOVOLOG) 100 UNIT/ML injection Inject 3 Units into the skin 3 (three) times daily with meals. 11/13/17  Yes Wieting, Richard, MD  insulin glargine (LANTUS) 100 UNIT/ML injection Inject 0.12 mLs (12 Units total) into the  skin daily. Patient taking differently: Inject 15 Units into the skin daily.  11/14/17  Yes Wieting, Richard, MD  letrozole [facility]) 2.5 MG tablet Take 2.5 mg by mouth every morning.   Yes [provider]  metoprolol tartrate (LOPRESSOR) 25 MG tablet Take 1 tablet (25 mg total) by mouth 2 (two) times daily. 11/13/17  Yes Wieting, Richard, MD  nystatin (MYCOSTATIN/NYSTOP) powder Apply topically 2 (two) times daily. 11/13/17  Yes Wieting, Richard, MD  PARoxetine (PAXIL) 40 MG tablet Take 1 tablet by mouth daily.   Yes [provider]  QUEtiapine (SEROQUEL) 400 MG tablet Take 1 tablet by mouth at bedtime. 02/22/18  Yes [provider]  rOPINIRole (REQUIP) 0.5 MG tablet Take 1 tablet by mouth at bedtime. 02/16/18  Yes [provider]  simvastatin (ZOCOR) 40 MG tablet Take 40 mg by mouth daily.   Yes [provider]    Inpatient Medications: Scheduled Meds: . [START ON 03/13/2018] aspirin  81 mg Oral Daily  . [START ON 03/13/2018] atorvastatin  80 mg Oral q1800  . [START ON 03/13/2018] carvedilol  3.125 mg Oral BID WC  . [START ON 03/13/2018] feeding supplement (ENSURE ENLIVE)  237 mL Oral TID BM  . furosemide  40 mg Intravenous Q12H  . insulin aspart  0-5 Units Subcutaneous QHS  . [START ON 03/13/2018] insulin aspart  0-9 Units Subcutaneous TID WC  . [START ON 03/13/2018] letrozole  2.5 mg Oral BH-q7a  . QUEtiapine  400 mg Oral QHS  . sodium chloride flush  3 mL Intravenous Q12H  . [START ON 03/13/2018] ticagrelor  90 mg Oral BID   Continuous Infusions: . sodium chloride     PRN Meds: sodium chloride, acetaminophen, ALPRAZolam, ondansetron (ZOFRAN) IV, sodium chloride flush  Allergies:    Allergies  Allergen Reactions  . Hydrocodone-Acetaminophen Other (See Comments)    Increased dream activity  . Meperidine Other (See Comments)    Rapid heart rate  . Penicillins Rash    Has patient had a PCN reaction causing immediate rash, facial/tongue/throat  swelling, SOB or lightheadedness with hypotension: Yes Has patient had a PCN reaction causing severe rash involving mucus membranes or skin necrosis: No Has patient had a PCN reaction that required hospitalization: No Has patient had a PCN reaction occurring within the last 10 years: No If all of the above answers are "NO", then may proceed with Cephalosporin use.     Social History:   Social History   Socioeconomic History  . Marital status: Married    Spouse name: Not on file  . Number of children: Not on file  . Years of education: Not on file  . Highest education level: Not on file  Occupational History  . Not on file  Social Needs  . Financial resource strain: Not on file  . Food insecurity:    Worry: Not on file    Inability: Not on file  . Transportation needs:    Medical: Not on file    Non-medical: Not on file  Tobacco Use  . Smoking status: Never Smoker  . Smokeless tobacco: Never Used  Substance and Sexual Activity  . Alcohol use: No  . Drug use: No  . Sexual activity: Not on file  Lifestyle  . Physical activity:    Days per week: Not on file    Minutes per session: Not on file  . Stress: Not on file  Relationships  . Social connections:    Talks on phone: Not on file    Gets together: Not on file    Attends religious service: Not on file    Active member of club or organization: Not on file    Attends meetings of clubs or organizations: Not on file    Relationship status: Not on file  . Intimate partner violence:    Fear of current or ex partner: Not on file    Emotionally abused: Not on file    Physically abused: Not on file    Forced sexual activity: Not on file  Other Topics Concern  . Not on file  Social History Narrative  . Not on file    Family History:    Family History  Problem Relation Age of Onset  . Pancreatic cancer Father 61  . Colon cancer Paternal Uncle   . Pancreatic cancer Unknown   . Heart failure Mother   . Breast cancer  Mother      ROS:  Please see the history of present illness.   All other ROS reviewed and negative.     Physical Exam/Data:   Vitals:   03/16/18 2026 03/16/18 2039 03-16-18 2100 03/16/2018 2129  BP: 131/64 133/66 132/85   Pulse: (!) 117 (!) 121 (!) 122   Resp: 20 (!) 31 (!) 34   Temp: 99 F (37.2 C)     TempSrc: Oral     SpO2: 97% 97% 96% 92%  Weight: 173 lb (78.5 kg)     Height: 5\' 7"  (1.702 m)      No intake or output data in the 24 hours ending 16-Mar-2018 2255 Filed Weights   Mar 16, 2018 2026  Weight: 173 lb (78.5 kg)   Body mass index is 27.1 kg/m.  General:  Well nourished, well developed, in mild distress due to shortness of breath. HEENT: normal Lymph: no adenopathy Neck: no JVD Endocrine:  No thryomegaly Vascular: No carotid bruits; FA pulses 2+ bilaterally without bruits  Cardiac:  normal S1, S2; RRR; no murmur .  Mild tachycardia. Lungs: Diminished breath sounds at the base with bibasilar crackles. Abd: soft, nontender, no hepatomegaly  Ext: no edema Musculoskeletal:  No deformities, BUE and BLE strength normal and equal Skin: warm and dry  Neuro:  CNs 2-12 intact, no focal abnormalities noted Psych:  Normal affect   EKG:  The EKG was personally reviewed and demonstrates: Sinus tachycardia with 1 mm of ST elevation in V3 and V4 as well as 1 to 2 mm of ST elevation in the inferior leads.  Relevant CV Studies: Echocardiogram is pending.  Laboratory Data:  Chemistry Recent Labs  Lab 16-Mar-2018 2028  NA 135  K 4.1  CL 100*  CO2 27  GLUCOSE 183*  BUN 13  CREATININE 1.04*  CALCIUM 9.8  GFRNONAA 52*  GFRAA >60  ANIONGAP 8    Recent Labs  Lab 2018-03-16 2028  PROT 7.7  ALBUMIN 3.4*  AST 27  ALT 18  ALKPHOS 79  BILITOT 0.9   Hematology Recent Labs  Lab 03-16-18 2028  WBC 12.3*  RBC 4.18  HGB 12.6  HCT 36.8  MCV 88.0  MCH 30.2  MCHC 34.3  RDW 13.7  PLT 315   Cardiac Enzymes Recent Labs  Lab 03/13/2018 2028  TROPONINI 1.25*   No  results for input(s): TROPIPOC in the last 168 hours.  BNPNo results for input(s): BNP, PROBNP in the last 168 hours.  DDimer No results for input(s): DDIMER in the last 168 hours.  Radiology/Studies:  Dg Chest Port 1 View  Result Date: Mar 13, 2018 CLINICAL DATA:  Weakness.  Nausea.  Vomiting and diarrhea. EXAM: PORTABLE CHEST 1 VIEW COMPARISON:  Radiograph 11/07/2017 FINDINGS: Unchanged cardiomegaly. Mediastinal contours are unchanged. Bilateral pleural effusions, moderate on the right and mild-to-moderate on the left, similar to prior exam allowing for differences in technique. Increased pulmonary edema. No pneumothorax. IMPRESSION: Pulmonary edema with cardiomegaly and bilateral pleural effusions, suggesting CHF. Electronically Signed   By: Jeb Levering M.D.   On: 13-Mar-2018 21:04    Assessment and Plan:   1. ST elevation myocardial infarction with late presentation: The EKG has anterior as well as inferior ST elevation.  The patient's symptoms started more than 5 days ago and she appears to be in heart failure and tachycardic.  She is not hypotensive.  I have recommended proceeding with emergent cardiac catheterization and possible PCI.  I discussed the procedure in details as well as risks and benefits.  She was given aspirin and unfractionated heparin.  Please see cath note for further recommendations. 2. Hyperlipidemia: Switch simvastatin to atorvastatin. 3. Diabetes mellitus: Management per internal medicine.   For questions or updates, please contact [place] Please consult [email or web address] for contact info under Cardiology/STEMI.   Signed, Kathlyn [place], MD  03-13-18 10:55 PM

## 2018-03-24 NOTE — ED Triage Notes (Addendum)
Pt in with co generalized weakness and nausea. States has had symptoms for 2-3 weeks, has had vomiting and diarrhea intermittently but none today. Pt states now having a hard time walking and unable to eat. Pt denies any cp does have abd pain.

## 2018-03-24 NOTE — ED Notes (Addendum)
Patient placed on 3L [place] and patient denies Chest pain but stated that she has had abd pain x1 week. Patient undressed and placed on zoll pads and cardiac monitoring

## 2018-03-24 NOTE — ED Provider Notes (Signed)
[facility] Emergency Department Provider Note  Time seen: 8:37 PM  I have reviewed the triage vital signs and the nursing notes.   HISTORY  Chief Complaint Weakness    HPI Yvette Tyler is a 72 y.o. female with a past medical history of anxiety, depression, diabetes, DVT, hypertension, hyperlipidemia, CVA, presents to the emergency department for nausea vomiting and generalized weakness.  According to the patient for the past 1 week she has had intermittent nausea vomiting for the past few days generalized fatigue and weakness.  Patient states nausea vomiting worse today.  EKG presented to myself shows concerns for possible STEMI, patient brought emergently back to the room for evaluation.  Patient denies any chest pain, denies shortness of breath but states she has been breathing more rapidly than normal.   Past Medical History:  Diagnosis Date  . Anxiety   . Cancer ([place])   . Depression   . Diabetes mellitus without complication ([place])   . DVT (deep vein thrombosis) in pregnancy ([place])   . Hyperlipidemia   . Hypertension   . Insomnia   . Osteoarthritis   . Rectal bleeding   . Stroke ([place])   . Suicidal ideation     Patient Active Problem List   Diagnosis Date Noted  . Generalized anxiety disorder 11/09/2017  . Meningitis   . Hyperglycemic hyperosmolar nonketotic coma ([place])   . Acute encephalopathy 11/04/2017  . HTN (hypertension) 11/04/2017  . HLD (hyperlipidemia) 11/04/2017  . Diabetes ([place]) 11/04/2017  . Leukocytosis 11/04/2017  . History of stroke 11/04/2017  . Sepsis ([place]) 11/04/2017  . Primary cancer of upper inner quadrant of left female breast ([place]) 11/13/2015    Past Surgical History:  Procedure Laterality Date  . AXILLARY LYMPH NODE DISSECTION Left 12/08/2015   Procedure: AXILLARY LYMPH NODE DISSECTION;  Surgeon: Leonie Green, MD;  Location: [facility] ORS;  Service: General;  Laterality: Left;  POSSIBLE AXILLARY NODE DISSECTION   . BREAST BIOPSY Left 10/27/2015   path pending  . COLONOSCOPY    . MASTECTOMY Left 12/08/2015  . PARTIAL MASTECTOMY WITH AXILLARY SENTINEL LYMPH NODE BIOPSY Left 12/08/2015   Procedure: LEFT MASTECTOMY WITH AXILLARY SENTINEL LYMPH NODE BIOPSY;  Surgeon: Leonie Green, MD;  Location: [facility] ORS;  Service: General;  Laterality: Left;  Marland Kitchen VEIN BYPASS SURGERY      Prior to Admission medications   Medication Sig Start Date End Date Taking? Authorizing Provider  acetaminophen (TYLENOL) 325 MG tablet Take 2 tablets (650 mg total) by mouth every 4 (four) hours as needed for mild pain (temp > 101.5). 11/13/17   Loletha Grayer, MD  ALPRAZolam Duanne Moron) 0.5 MG tablet Take 1 tablet (0.5 mg total) by mouth every 6 (six) hours as needed for anxiety. 11/13/17   Loletha Grayer, MD  amLODipine (NORVASC) 5 MG tablet Take 1 tablet (5 mg total) by mouth daily. 11/14/17   Loletha Grayer, MD  etodolac (LODINE) 300 MG capsule Take 1 capsule (300 mg total) by mouth 2 (two) times daily. 11/13/17   Loletha Grayer, MD  feeding supplement, ENSURE ENLIVE, (ENSURE ENLIVE) LIQD Take 237 mLs by mouth 3 (three) times daily between meals. 11/13/17   Loletha Grayer, MD  imipramine (TOFRANIL) 50 MG tablet Take 2 tablets by mouth at bedtime.    [provider]  insulin aspart (NOVOLOG) 100 UNIT/ML injection Inject 3 Units into the skin 3 (three) times daily with meals. 11/13/17   Loletha Grayer, MD  insulin glargine (LANTUS) 100 UNIT/ML injection  Inject 0.12 mLs (12 Units total) into the skin daily. 11/14/17   Loletha Grayer, MD  metoprolol tartrate (LOPRESSOR) 25 MG tablet Take 1 tablet (25 mg total) by mouth 2 (two) times daily. 11/13/17   Loletha Grayer, MD  nystatin (MYCOSTATIN/NYSTOP) powder Apply topically 2 (two) times daily. 11/13/17   Loletha Grayer, MD  ondansetron (ZOFRAN) 4 MG tablet Take 1 tablet by mouth every 8 (eight) hours as needed for nausea.  11/02/17   [provider]  PARoxetine  (PAXIL) 40 MG tablet Take 1 tablet by mouth daily.    [provider]  QUEtiapine (SEROQUEL) 300 MG tablet Take 1 tablet by mouth at bedtime.    [provider]    Allergies  Allergen Reactions  . Hydrocodone-Acetaminophen Other (See Comments)    Increased dream activity  . Meperidine Other (See Comments)    Rapid heart rate  . Penicillins Rash    Has patient had a PCN reaction causing immediate rash, facial/tongue/throat swelling, SOB or lightheadedness with hypotension: Yes Has patient had a PCN reaction causing severe rash involving mucus membranes or skin necrosis: No Has patient had a PCN reaction that required hospitalization: No Has patient had a PCN reaction occurring within the last 10 years: No If all of the above answers are "NO", then may proceed with Cephalosporin use.     Family History  Problem Relation Age of Onset  . Pancreatic cancer Father 32  . Colon cancer Paternal Uncle   . Pancreatic cancer Unknown   . Heart failure Mother   . Breast cancer Mother     Social History Social History   Tobacco Use  . Smoking status: Never Smoker  . Smokeless tobacco: Never Used  Substance Use Topics  . Alcohol use: No  . Drug use: No    Review of Systems Constitutional: Negative for fever. Eyes: Negative for visual complaints ENT: Negative for recent illness/congestion Cardiovascular: Negative for chest pain. Respiratory: Negative for shortness of breath.  Increased respiratory rate per patient. Gastrointestinal: Abdominal fullness.  Positive for nausea vomiting.  Diarrhea today as well. Genitourinary: Negative for urinary compaints Musculoskeletal: Negative for leg pain or swelling. Skin: Negative for skin complaints  Neurological: Negative for headache All other ROS negative  ____________________________________________   PHYSICAL EXAM:  VITAL SIGNS: ED Triage Vitals [Apr 01, 2018 2026]  Enc Vitals Group     BP 131/64     Pulse Rate (!)  117     Resp 20     Temp 99 F (37.2 C)     Temp Source Oral     SpO2 97 %     Weight 173 lb (78.5 kg)     Height 5\' 7"  (1.702 m)     Head Circumference      Peak Flow      Pain Score 7     Pain Loc      Pain Edu?      Excl. in [place]?    Constitutional: Alert and oriented. Well appearing and in no distress. Eyes: Normal exam ENT   Head: Normocephalic and atraumatic.   Mouth/Throat: Mucous membranes are moist. Cardiovascular: Normal rate, regular rhythm.  Respiratory: Normal respiratory effort without tachypnea nor retractions. Breath sounds are clear Gastrointestinal: Soft and nontender. No distention. Musculoskeletal: Nontender with normal range of motion in all extremities. No lower extremity tenderness Neurologic:  Normal speech and language. No gross focal neurologic deficits Skin:  Skin is warm, dry and intact.  Psychiatric: Mood and affect are  normal. Speech and behavior are normal.   ____________________________________________    EKG  EKG #1 reviewed and interpreted by myself 20: 29: 48 shows sinus tachycardia 118 bpm with a narrow QRS, normal axis, largely normal intervals.  There is concerning ST elevation in leads II, III, aVF, V3 and V4.  No obvious reciprocal depressions.  EKG #2 reviewed and interpreted by myself 20: 41: 14 shows sinus tachycardia 121 bpm with a narrow QRS, normal axis, largely normal intervals again concerning elevations in lead II, 3, aVF, V3, V4, mildly in V5 as well again no reciprocal depressions.  ____________________________________________    RADIOLOGY  IMPRESSION: Pulmonary edema with cardiomegaly and bilateral pleural effusions, suggesting CHF.  ____________________________________________   INITIAL IMPRESSION / ASSESSMENT AND PLAN / ED COURSE  Pertinent labs & imaging results that were available during my care of the patient were reviewed by me and considered in my medical decision making (see chart for details).  She  presents to the emergency department for 1 week of intermittent nausea vomiting and generalized fatigue/weakness.  Patient states her symptoms are worse today so she came to the emergency department.  EKG taken in triage is concerning for possible inferolateral STEMI, but no clear reciprocal depressions.  Differential as time would include STEMI, ACS, pericarditis, metabolic abnormality.  We will check labs including troponin.  I discussed the patient with Dr. Fletcher Anon of cardiology, we have activated code STEMI.  Dosed aspirin as well as heparin.  I discussed this with the patient she is agreeable to plan of care for cardiac catheterization.  Patient's labs are resulted with a troponin of 1.25.  CXR concerning for CHF.  Dr. Fletcher Anon has taken the patient to the cardiac catheterization lab.  CRITICAL CARE Performed by: Harvest Dark   Total critical care time: 30 minutes  Critical care time was exclusive of separately billable procedures and treating other patients.  Critical care was necessary to treat or prevent imminent or life-threatening deterioration.  Critical care was time spent personally by me on the following activities: development of treatment plan with patient and/or surrogate as well as nursing, discussions with consultants, evaluation of patient's response to treatment, examination of patient, obtaining history from patient or surrogate, ordering and performing treatments and interventions, ordering and review of laboratory studies, ordering and review of radiographic studies, pulse oximetry and re-evaluation of patient's condition.   ____________________________________________   FINAL CLINICAL IMPRESSION(S) / ED DIAGNOSES  STEMI    Harvest Dark, MD 2018/04/01 2126

## 2018-03-24 NOTE — Progress Notes (Signed)
Chaplain met patient and husband, Yvette Tyler, in the ED, while patient was receiving care.  Patient had suffered a heart attack but was communicative. Chaplain walked with the couple from the ED to the Cath Lab, and remained with Yvette Tyler throughout most of the procedure. Chaplain provided active listening and pastoral presence.

## 2018-03-24 NOTE — Progress Notes (Signed)
At pt bedside at 2300.  Pt alert and oriented.  Conversing with staff.  Respiratory therapy at bedside for EKG.  Pt became unresponsive.  Returned to room - called by RT.  Pt unresponsive in PEA.  Initiated Code [place] at [employer].

## 2018-03-24 DEATH — deceased
